# Patient Record
Sex: Female | Born: 1940 | Race: Black or African American | Hispanic: No | Marital: Married | Smoking: Former smoker
Health system: Southern US, Community
[De-identification: ages and names within clinical notes are randomized; demographics above are authoritative.]

## PROBLEM LIST (undated history)

## (undated) DIAGNOSIS — I351 Nonrheumatic aortic (valve) insufficiency: Secondary | ICD-10-CM

## (undated) DIAGNOSIS — D649 Anemia, unspecified: Secondary | ICD-10-CM

## (undated) DIAGNOSIS — N189 Chronic kidney disease, unspecified: Secondary | ICD-10-CM

## (undated) DIAGNOSIS — N393 Stress incontinence (female) (male): Secondary | ICD-10-CM

## (undated) DIAGNOSIS — M1990 Unspecified osteoarthritis, unspecified site: Secondary | ICD-10-CM

## (undated) DIAGNOSIS — Z89619 Acquired absence of unspecified leg above knee: Secondary | ICD-10-CM

## (undated) DIAGNOSIS — Z9289 Personal history of other medical treatment: Secondary | ICD-10-CM

## (undated) DIAGNOSIS — Z8701 Personal history of pneumonia (recurrent): Secondary | ICD-10-CM

## (undated) DIAGNOSIS — Z8709 Personal history of other diseases of the respiratory system: Secondary | ICD-10-CM

## (undated) DIAGNOSIS — I739 Peripheral vascular disease, unspecified: Secondary | ICD-10-CM

## (undated) DIAGNOSIS — I272 Pulmonary hypertension, unspecified: Secondary | ICD-10-CM

## (undated) DIAGNOSIS — R0602 Shortness of breath: Secondary | ICD-10-CM

## (undated) DIAGNOSIS — I10 Essential (primary) hypertension: Secondary | ICD-10-CM

## (undated) DIAGNOSIS — E119 Type 2 diabetes mellitus without complications: Secondary | ICD-10-CM

## (undated) HISTORY — DX: Essential (primary) hypertension: I10

## (undated) HISTORY — DX: Unspecified osteoarthritis, unspecified site: M19.90

## (undated) HISTORY — DX: Type 2 diabetes mellitus without complications: E11.9

## (undated) HISTORY — DX: Chronic kidney disease, unspecified: N18.9

## (undated) HISTORY — PX: ABDOMINAL HYSTERECTOMY: SHX81

## (undated) HISTORY — DX: Shortness of breath: R06.02

## (undated) HISTORY — DX: Peripheral vascular disease, unspecified: I73.9

---

## 1999-12-07 ENCOUNTER — Encounter: Payer: Self-pay | Admitting: Neurosurgery

## 1999-12-11 ENCOUNTER — Encounter: Payer: Self-pay | Admitting: Neurosurgery

## 1999-12-11 ENCOUNTER — Ambulatory Visit (HOSPITAL_COMMUNITY): Admission: RE | Admit: 1999-12-11 | Discharge: 1999-12-12 | Payer: Self-pay | Admitting: Neurosurgery

## 2000-09-17 HISTORY — PX: BACK SURGERY: SHX140

## 2015-04-20 HISTORY — PX: OTHER SURGICAL HISTORY: SHX169

## 2015-10-21 ENCOUNTER — Encounter: Payer: Self-pay | Admitting: Surgery

## 2015-10-25 ENCOUNTER — Encounter: Payer: Self-pay | Admitting: Surgery

## 2015-10-28 ENCOUNTER — Encounter: Payer: Self-pay | Admitting: Surgery

## 2015-10-28 ENCOUNTER — Ambulatory Visit (INDEPENDENT_AMBULATORY_CARE_PROVIDER_SITE_OTHER): Payer: Medicare Other | Admitting: Surgery

## 2015-10-28 ENCOUNTER — Ambulatory Visit (HOSPITAL_COMMUNITY)
Admission: RE | Admit: 2015-10-28 | Discharge: 2015-10-28 | Disposition: A | Discharge status: Home / Self Care | Payer: Medicare Other | Source: Ambulatory Visit | Attending: Surgery | Admitting: Surgery

## 2015-10-28 VITALS — BP 130/63 | HR 89 | Temp 97.3°F | Resp 16 | Ht 62.0 in | Wt 137.0 lb

## 2015-10-28 DIAGNOSIS — Z01810 Encounter for preprocedural cardiovascular examination: Secondary | ICD-10-CM

## 2015-10-28 DIAGNOSIS — I739 Peripheral vascular disease, unspecified: Secondary | ICD-10-CM | POA: Insufficient documentation

## 2015-10-28 DIAGNOSIS — I129 Hypertensive chronic kidney disease with stage 1 through stage 4 chronic kidney disease, or unspecified chronic kidney disease: Secondary | ICD-10-CM | POA: Diagnosis not present

## 2015-10-28 DIAGNOSIS — N189 Chronic kidney disease, unspecified: Secondary | ICD-10-CM | POA: Insufficient documentation

## 2015-10-28 DIAGNOSIS — E1122 Type 2 diabetes mellitus with diabetic chronic kidney disease: Secondary | ICD-10-CM | POA: Diagnosis not present

## 2015-10-28 NOTE — Progress Notes (Signed)
Patient name: Jessica Abbott MRN: [id number] DOB: 15-Jan-1941 Sex: female   Referred by: Dr. Renard Matter  Reason for referral:  Chief Complaint  Patient presents with  . New Evaluation    2nd opinion ? amputation of right leg    HISTORY OF PRESENT ILLNESS:  this is a 75 year old female who comes in today for a second opinion regarding her right leg. She has a recent history of a left femoral to dorsalis pedis bypass graft for limb salvage. Approximately 2 months ago she developed a wound on her right foot. 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 She has a chronic contracture of the right knee which she states has been there for 6 months.  She does state that she can ambulate. Her right leg has always been her good leg. Because of her vascular findings and her contracture, above-knee amputation has been recommended.    the patient suffers from diabetes which is not well controlled. She is medically managed for hypertension with an ACE inhibitor. She has chronic kidney disease.  She is not on a statin. She is a former smoker.  She is on Coumadin  Past Medical History  Diagnosis Date  . Hypertension   . Peripheral vascular disease ([place])   . Diabetes mellitus without complication ([place])   . Arthritis   . Peripheral arterial disease ([place])   . Chronic kidney disease   . SOB (shortness of breath)     Past Surgical History  Procedure Laterality Date  . Back surgery  2002  . Abdominal hysterectomy    . Femoral artery arteriogram  04/20/2015    Social History   Social History  . Marital Status: Married    Spouse Name: N/A  . Number of Children: N/A  . Years of Education: N/A   Occupational History  . Not on file.   Social History Main Topics  . Smoking status: Former Smoker    Quit date: 10/27/2013  . Smokeless tobacco: Never Used  . Alcohol Use: No  . Drug Use: No  .  Sexual Activity: Not on file   Other Topics Concern  . Not on file   Social History Narrative    Family History  Problem Relation Age of Onset  . Heart disease Father     Allergies as of 10/28/2015  . (No Known Allergies)    Current Outpatient Prescriptions on File Prior to Visit  Medication Sig Dispense Refill  . amLODipine (NORVASC) 5 MG tablet Take 5 mg by mouth.    Marland Kitchen aspirin EC 81 MG tablet Take 81 mg by mouth.    . baclofen (LIORESAL) 10 MG tablet TAKE ONE-HALF TABLET BY MOUTH IN THE MORNING AND ONE-HALF TABLET WITH LUNCH AND ONE TABLET AT BEDTIME AS NEEDED FOR SEVERE PAIN    . Cholecalciferol (VITAMIN D3) 2000 units capsule Take by mouth.    Marland Kitchen ipratropium-albuterol (DUONEB) 0.5-2.5 (3) MG/3ML SOLN 3 mLs.    Marland Kitchen lisinopril (PRINIVIL,ZESTRIL) 20 MG tablet Take 20 mg by mouth.    . metFORMIN (GLUCOPHAGE) 500 MG tablet TAKE ONE TABLET BY MOUTH IN THE MORNING    . polyethylene glycol (MIRALAX / GLYCOLAX) packet Take 17 g by mouth.    . warfarin (COUMADIN) 4 MG tablet Take 4 mg by mouth.    Marland Kitchen HYDROcodone-acetaminophen (NORCO/VICODIN) 5-325 MG tablet Take by mouth. Reported on 10/28/2015     No current facility-administered medications on  file prior to visit.     REVIEW OF SYSTEMS: Cardiovascular: No chest pain, chest pressure.   Positive for leg swelling and pain Pulmonary: No productive cough, asthma or wheezing. Neurologic: No weakness, paresthesias, aphasia, or amaurosis. No dizziness. Hematologic: No bleeding problems or clotting disorders. Musculoskeletal: No joint pain or joint swelling. Gastrointestinal: No blood in stool or hematemesis Genitourinary: No dysuria or hematuria. Psychiatric:: No history of major depression. Integumentary:  Open wounds on right toes Constitutional: No fever or chills.  PHYSICAL EXAMINATION:  Filed Vitals:   10/28/15 1046  BP: 130/63  Pulse: 89  Temp: 97.3 F (36.3 C)  Resp: 16  Height: 5\' 2"  (1.575 m)  Weight: 137 lb (62.143  kg)  SpO2: 100%   Body mass index is 25.05 kg/(m^2). General: The patient appears their stated age.   HEENT:  No gross abnormalities Pulmonary: Respirations are non-labored Musculoskeletal:  Contracture to the right knee Neurologic: No focal weakness or paresthesias are detected, Skin:  Ischemic changes to the right first second third and fourth toe Psychiatric: The patient has normal affect. Cardiovascular: There is a regular rate and rhythm without significant murmur appreciated. Pedal pulses are not palpable   Diagnostic Studies:  vein mapping reveals an adequate right saphenous vein   I have reviewed her outside angiography which shows small vessels but a patent iliofemoral system.  The superficial femoral artery is occluded.  There is single vessel runoff via the peroneal artery   Assessment:   atherosclerosis with ulceration, right leg Plan:  this is a very difficult situation for this patient because she had a Storq leg has deep ended on her right leg, and now she has limb threatening issues with this leg. Most concerning is the chronic contracture in her right leg. I think this will make bypass surgery difficult.  At this point, this leg is not very functional and so I'm concerned about putting her through a bypass with limited benefit.  Regardless she has ischemic changes to her toes which at minimum will require a transmetatarsal amputation.  I do not think she will heal this without revascularization.  Therefore the question is to proceed with an above-knee amputation as primary therapy or to put her through a thin peroneal bypass graft and transmetatarsal amputation.   after discussing this further with the family, they want to think about this over the weekend but are leaning towards a right femoral peroneal bypass grafting and transmetatarsal amputation. They understand that if this does not work or she has issues that she will need an above-knee amputation. She will need to be  off Coumadin for 5 days.  She tells me she is on Coumadin because of her fem dorsalis pedis bypass   V. Leia Alf, M.D. [facility] Office: [phone] Pager:  [phone]

## 2015-11-01 NOTE — Addendum Note (Signed)
Addended by: Dorthula Rue L on: 11/01/2015 09:58 AM   Modules accepted: Orders

## 2015-11-15 ENCOUNTER — Inpatient Hospital Stay (HOSPITAL_COMMUNITY)
Admission: RE | Admit: 2015-11-15 | Discharge: 2015-11-15 | Disposition: A | Discharge status: Home / Self Care | Payer: Medicare Other | Source: Ambulatory Visit

## 2015-11-15 DIAGNOSIS — Z01810 Encounter for preprocedural cardiovascular examination: Secondary | ICD-10-CM

## 2015-11-15 DIAGNOSIS — I739 Peripheral vascular disease, unspecified: Secondary | ICD-10-CM

## 2015-11-16 ENCOUNTER — Other Ambulatory Visit: Payer: Self-pay

## 2015-11-18 ENCOUNTER — Telehealth: Payer: Self-pay

## 2015-11-18 NOTE — Telephone Encounter (Signed)
Rec'd phone call from Nurse Practitioner re: question of Lovenox bridge while off Coumadin.  Stated she is requesting the Vascular Surgeon to make the decision re: necessity of Lovenox Bridge, while off Coumadin, preoperatively.  Reported history that the pt. was initially started on Coumadin by a Vascular surgeon @ [facility], following a Femoral - Pedal Dorsalis BP.   Discussed Lovenox bridge with Dr. Trula Slade.  Advised that pt. should stop Coumadin 5 days prior to surgery, and will not need a Lovenox bridge.  Reported Dr. Stephens Shire recommendation to [facility] at [facility]. Clinic.  Will fax recommendations to [phone].

## 2015-11-22 ENCOUNTER — Inpatient Hospital Stay (HOSPITAL_BASED_OUTPATIENT_CLINIC_OR_DEPARTMENT_OTHER)
Admission: EM | Admit: 2015-11-22 | Discharge: 2015-11-29 | DRG: 377 | Disposition: A | Discharge status: Home / Self Care | Payer: Medicare Other | Attending: Internal Medicine | Admitting: Internal Medicine

## 2015-11-22 ENCOUNTER — Encounter (HOSPITAL_BASED_OUTPATIENT_CLINIC_OR_DEPARTMENT_OTHER): Payer: Self-pay | Admitting: *Deleted

## 2015-11-22 ENCOUNTER — Emergency Department (HOSPITAL_BASED_OUTPATIENT_CLINIC_OR_DEPARTMENT_OTHER): Payer: Medicare Other

## 2015-11-22 DIAGNOSIS — E1169 Type 2 diabetes mellitus with other specified complication: Secondary | ICD-10-CM

## 2015-11-22 DIAGNOSIS — I70234 Atherosclerosis of native arteries of right leg with ulceration of heel and midfoot: Secondary | ICD-10-CM | POA: Diagnosis not present

## 2015-11-22 DIAGNOSIS — Z7984 Long term (current) use of oral hypoglycemic drugs: Secondary | ICD-10-CM

## 2015-11-22 DIAGNOSIS — A480 Gas gangrene: Secondary | ICD-10-CM | POA: Diagnosis present

## 2015-11-22 DIAGNOSIS — I739 Peripheral vascular disease, unspecified: Secondary | ICD-10-CM | POA: Diagnosis not present

## 2015-11-22 DIAGNOSIS — I96 Gangrene, not elsewhere classified: Secondary | ICD-10-CM

## 2015-11-22 DIAGNOSIS — K449 Diaphragmatic hernia without obstruction or gangrene: Secondary | ICD-10-CM | POA: Diagnosis present

## 2015-11-22 DIAGNOSIS — K921 Melena: Principal | ICD-10-CM

## 2015-11-22 DIAGNOSIS — Z79899 Other long term (current) drug therapy: Secondary | ICD-10-CM

## 2015-11-22 DIAGNOSIS — M1990 Unspecified osteoarthritis, unspecified site: Secondary | ICD-10-CM | POA: Diagnosis present

## 2015-11-22 DIAGNOSIS — Z7901 Long term (current) use of anticoagulants: Secondary | ICD-10-CM

## 2015-11-22 DIAGNOSIS — N179 Acute kidney failure, unspecified: Secondary | ICD-10-CM | POA: Diagnosis present

## 2015-11-22 DIAGNOSIS — Z87891 Personal history of nicotine dependence: Secondary | ICD-10-CM

## 2015-11-22 DIAGNOSIS — R791 Abnormal coagulation profile: Secondary | ICD-10-CM | POA: Diagnosis present

## 2015-11-22 DIAGNOSIS — K5730 Diverticulosis of large intestine without perforation or abscess without bleeding: Secondary | ICD-10-CM | POA: Diagnosis present

## 2015-11-22 DIAGNOSIS — R578 Other shock: Secondary | ICD-10-CM

## 2015-11-22 DIAGNOSIS — D689 Coagulation defect, unspecified: Secondary | ICD-10-CM

## 2015-11-22 DIAGNOSIS — I349 Nonrheumatic mitral valve disorder, unspecified: Secondary | ICD-10-CM | POA: Diagnosis not present

## 2015-11-22 DIAGNOSIS — E1152 Type 2 diabetes mellitus with diabetic peripheral angiopathy with gangrene: Secondary | ICD-10-CM | POA: Diagnosis present

## 2015-11-22 DIAGNOSIS — E1122 Type 2 diabetes mellitus with diabetic chronic kidney disease: Secondary | ICD-10-CM | POA: Diagnosis present

## 2015-11-22 DIAGNOSIS — M24561 Contracture, right knee: Secondary | ICD-10-CM | POA: Diagnosis present

## 2015-11-22 DIAGNOSIS — E11622 Type 2 diabetes mellitus with other skin ulcer: Secondary | ICD-10-CM | POA: Diagnosis present

## 2015-11-22 DIAGNOSIS — D62 Acute posthemorrhagic anemia: Secondary | ICD-10-CM

## 2015-11-22 DIAGNOSIS — E118 Type 2 diabetes mellitus with unspecified complications: Secondary | ICD-10-CM | POA: Diagnosis present

## 2015-11-22 DIAGNOSIS — K922 Gastrointestinal hemorrhage, unspecified: Secondary | ICD-10-CM

## 2015-11-22 DIAGNOSIS — N189 Chronic kidney disease, unspecified: Secondary | ICD-10-CM | POA: Diagnosis present

## 2015-11-22 DIAGNOSIS — D500 Iron deficiency anemia secondary to blood loss (chronic): Secondary | ICD-10-CM | POA: Diagnosis present

## 2015-11-22 DIAGNOSIS — Z7982 Long term (current) use of aspirin: Secondary | ICD-10-CM

## 2015-11-22 DIAGNOSIS — I959 Hypotension, unspecified: Secondary | ICD-10-CM | POA: Diagnosis not present

## 2015-11-22 DIAGNOSIS — Z993 Dependence on wheelchair: Secondary | ICD-10-CM | POA: Diagnosis not present

## 2015-11-22 DIAGNOSIS — E872 Acidosis, unspecified: Secondary | ICD-10-CM

## 2015-11-22 DIAGNOSIS — L98499 Non-pressure chronic ulcer of skin of other sites with unspecified severity: Secondary | ICD-10-CM | POA: Diagnosis present

## 2015-11-22 DIAGNOSIS — I129 Hypertensive chronic kidney disease with stage 1 through stage 4 chronic kidney disease, or unspecified chronic kidney disease: Secondary | ICD-10-CM | POA: Diagnosis present

## 2015-11-22 DIAGNOSIS — T45515A Adverse effect of anticoagulants, initial encounter: Secondary | ICD-10-CM | POA: Diagnosis present

## 2015-11-22 DIAGNOSIS — S36209S Unspecified injury of unspecified part of pancreas, sequela: Secondary | ICD-10-CM | POA: Diagnosis present

## 2015-11-22 DIAGNOSIS — S36209A Unspecified injury of unspecified part of pancreas, initial encounter: Secondary | ICD-10-CM

## 2015-11-22 DIAGNOSIS — I272 Other secondary pulmonary hypertension: Secondary | ICD-10-CM | POA: Diagnosis present

## 2015-11-22 DIAGNOSIS — E139 Other specified diabetes mellitus without complications: Secondary | ICD-10-CM

## 2015-11-22 LAB — OCCULT BLOOD X 1 CARD TO LAB, STOOL: FECAL OCCULT BLD: POSITIVE — AB

## 2015-11-22 LAB — CBC WITH DIFFERENTIAL/PLATELET
BASOS PCT: 0 %
Basophils Absolute: 0 10*3/uL (ref 0.0–0.1)
EOS PCT: 0 %
Eosinophils Absolute: 0 10*3/uL (ref 0.0–0.7)
HCT: 12.5 % — ABNORMAL LOW (ref 36.0–46.0)
Hemoglobin: 3.5 g/dL — CL (ref 12.0–15.0)
LYMPHS ABS: 2.7 10*3/uL (ref 0.7–4.0)
Lymphocytes Relative: 22 %
MCH: 18.9 pg — AB (ref 26.0–34.0)
MCHC: 28 g/dL — ABNORMAL LOW (ref 30.0–36.0)
MCV: 67.6 fL — AB (ref 78.0–100.0)
MONO ABS: 0.4 10*3/uL (ref 0.1–1.0)
MYELOCYTES: 1 %
Monocytes Relative: 3 %
NEUTROS ABS: 9.1 10*3/uL — AB (ref 1.7–7.7)
NEUTROS PCT: 74 %
PLATELETS: 610 10*3/uL — AB (ref 150–400)
RBC: 1.85 MIL/uL — ABNORMAL LOW (ref 3.87–5.11)
RDW: 18.6 % — AB (ref 11.5–15.5)
WBC: 12.2 10*3/uL — ABNORMAL HIGH (ref 4.0–10.5)

## 2015-11-22 LAB — PREPARE RBC (CROSSMATCH)

## 2015-11-22 LAB — SEDIMENTATION RATE: SED RATE: 120 mm/h — AB (ref 0–22)

## 2015-11-22 LAB — CBC
HCT: 11.9 % — ABNORMAL LOW (ref 36.0–46.0)
Hemoglobin: 3.3 g/dL — CL (ref 12.0–15.0)
MCH: 18.6 pg — ABNORMAL LOW (ref 26.0–34.0)
MCHC: 27.7 g/dL — ABNORMAL LOW (ref 30.0–36.0)
MCV: 67.2 fL — ABNORMAL LOW (ref 78.0–100.0)
PLATELETS: 614 10*3/uL — AB (ref 150–400)
RBC: 1.77 MIL/uL — ABNORMAL LOW (ref 3.87–5.11)
RDW: 18.3 % — AB (ref 11.5–15.5)
WBC: 12.1 10*3/uL — AB (ref 4.0–10.5)

## 2015-11-22 LAB — GLUCOSE, CAPILLARY
Glucose-Capillary: 100 mg/dL — ABNORMAL HIGH (ref 65–99)
Glucose-Capillary: 106 mg/dL — ABNORMAL HIGH (ref 65–99)

## 2015-11-22 LAB — I-STAT CG4 LACTIC ACID, ED: LACTIC ACID, VENOUS: 4.06 mmol/L — AB (ref 0.5–2.0)

## 2015-11-22 LAB — COMPREHENSIVE METABOLIC PANEL
ALK PHOS: 47 U/L (ref 38–126)
ALT: 11 U/L — AB (ref 14–54)
AST: 24 U/L (ref 15–41)
Albumin: 2.7 g/dL — ABNORMAL LOW (ref 3.5–5.0)
Anion gap: 11 (ref 5–15)
BUN: 23 mg/dL — AB (ref 6–20)
CALCIUM: 8.2 mg/dL — AB (ref 8.9–10.3)
CO2: 18 mmol/L — AB (ref 22–32)
CREATININE: 0.89 mg/dL (ref 0.44–1.00)
Chloride: 107 mmol/L (ref 101–111)
GFR calc non Af Amer: >60 mL/min (ref >60)
GLUCOSE: 177 mg/dL — AB (ref 65–99)
Potassium: 4.2 mmol/L (ref 3.5–5.1)
SODIUM: 136 mmol/L (ref 135–145)
Total Bilirubin: 0.5 mg/dL (ref 0.3–1.2)
Total Protein: 7.1 g/dL (ref 6.5–8.1)

## 2015-11-22 LAB — MRSA PCR SCREENING: MRSA by PCR: NEGATIVE

## 2015-11-22 LAB — PROTIME-INR
INR: 2.24 — AB (ref 0.00–1.49)
Prothrombin Time: 24.6 seconds — ABNORMAL HIGH (ref 11.6–15.2)

## 2015-11-22 LAB — ABO/RH: ABO/RH(D): A POS

## 2015-11-22 MED ORDER — SODIUM CHLORIDE 0.9 % IV SOLN
Freq: Once | INTRAVENOUS | Status: AC
  Administered 2015-11-22: 14:00:00 via INTRAVENOUS

## 2015-11-22 MED ORDER — FENTANYL CITRATE (PF) 100 MCG/2ML IJ SOLN
50.0000 ug | Freq: Once | INTRAMUSCULAR | Status: AC
  Administered 2015-11-22: 50 ug via INTRAVENOUS

## 2015-11-22 MED ORDER — SODIUM CHLORIDE 0.9 % IV SOLN
Freq: Once | INTRAVENOUS | Status: AC
  Administered 2015-11-22: 10 mL/h via INTRAVENOUS

## 2015-11-22 MED ORDER — PANTOPRAZOLE SODIUM 40 MG IV SOLR
40.0000 mg | Freq: Two times a day (BID) | INTRAVENOUS | Status: DC
  Administered 2015-11-26 – 2015-11-28 (×4): 40 mg via INTRAVENOUS
  Filled 2015-11-22 (×5): qty 40

## 2015-11-22 MED ORDER — SODIUM CHLORIDE 0.9 % IV SOLN: 250.0000 mL | INTRAVENOUS | Status: DC | PRN

## 2015-11-22 MED ORDER — SODIUM CHLORIDE 0.9 % IV SOLN: 10.0000 mL/h | Freq: Once | INTRAVENOUS | Status: AC

## 2015-11-22 MED ORDER — VITAMIN K1 10 MG/ML IJ SOLN
10.0000 mg | Freq: Once | INTRAMUSCULAR | Status: AC
  Administered 2015-11-22: 10 mg via INTRAMUSCULAR
  Filled 2015-11-22: qty 1

## 2015-11-22 MED ORDER — SODIUM CHLORIDE 0.9 % IV SOLN
1500.0000 mg | Freq: Once | INTRAVENOUS | Status: AC
  Administered 2015-11-22: 1500 mg via INTRAVENOUS
  Filled 2015-11-22: qty 1500

## 2015-11-22 MED ORDER — SODIUM CHLORIDE 0.9% FLUSH
3.0000 mL | Freq: Two times a day (BID) | INTRAVENOUS | Status: DC
  Administered 2015-11-22 – 2015-11-23 (×3): 3 mL via INTRAVENOUS
  Administered 2015-11-24: 10 mL via INTRAVENOUS
  Administered 2015-11-25 – 2015-11-28 (×5): 3 mL via INTRAVENOUS

## 2015-11-22 MED ORDER — MORPHINE SULFATE (PF) 2 MG/ML IV SOLN
2.0000 mg | INTRAVENOUS | Status: DC | PRN
  Administered 2015-11-22: 4 mg via INTRAVENOUS
  Administered 2015-11-23 – 2015-11-25 (×5): 2 mg via INTRAVENOUS
  Administered 2015-11-25: 4 mg via INTRAVENOUS
  Administered 2015-11-25 – 2015-11-29 (×5): 2 mg via INTRAVENOUS
  Filled 2015-11-22 (×3): qty 1
  Filled 2015-11-22: qty 2
  Filled 2015-11-22 (×4): qty 1
  Filled 2015-11-22: qty 2
  Filled 2015-11-22 (×3): qty 1

## 2015-11-22 MED ORDER — BACLOFEN 10 MG PO TABS
5.0000 mg | ORAL_TABLET | Freq: Two times a day (BID) | ORAL | Status: DC
  Administered 2015-11-22 – 2015-11-29 (×12): 5 mg via ORAL
  Filled 2015-11-22 (×15): qty 1

## 2015-11-22 MED ORDER — SODIUM CHLORIDE 0.9 % IV SOLN: 80.0000 mg | Freq: Once | INTRAVENOUS | Status: DC

## 2015-11-22 MED ORDER — INSULIN ASPART 100 UNIT/ML ~~LOC~~ SOLN
0.0000 [IU] | SUBCUTANEOUS | Status: DC
Start: 2015-11-22 — End: 2015-11-24
  Administered 2015-11-23: 2 [IU] via SUBCUTANEOUS
  Administered 2015-11-24 (×4): 1 [IU] via SUBCUTANEOUS

## 2015-11-22 MED ORDER — SODIUM CHLORIDE 0.9 % IV SOLN
80.0000 mg | Freq: Once | INTRAVENOUS | Status: AC
  Filled 2015-11-22: qty 80

## 2015-11-22 MED ORDER — VANCOMYCIN HCL IN DEXTROSE 1-5 GM/200ML-% IV SOLN: 1000.0000 mg | INTRAVENOUS | Status: DC

## 2015-11-22 MED ORDER — TRAZODONE HCL 100 MG PO TABS
100.0000 mg | ORAL_TABLET | Freq: Every evening | ORAL | Status: DC | PRN
  Administered 2015-11-26 – 2015-11-28 (×3): 100 mg via ORAL
  Filled 2015-11-22 (×4): qty 1

## 2015-11-22 MED ORDER — SODIUM CHLORIDE 0.9% FLUSH
3.0000 mL | INTRAVENOUS | Status: DC | PRN
  Administered 2015-11-22 (×2): 3 mL via INTRAVENOUS
  Filled 2015-11-22 (×2): qty 3

## 2015-11-22 MED ORDER — VANCOMYCIN HCL 10 G IV SOLR
1500.0000 mg | Freq: Once | INTRAVENOUS | Status: DC
  Filled 2015-11-22: qty 1500

## 2015-11-22 MED ORDER — PANTOPRAZOLE SODIUM 40 MG IV SOLR
INTRAVENOUS | Status: AC
  Filled 2015-11-22: qty 80

## 2015-11-22 MED ORDER — SODIUM CHLORIDE 0.9 % IV SOLN
INTRAVENOUS | Status: DC
  Administered 2015-11-22: 50 mL/h via INTRAVENOUS
  Administered 2015-11-25 – 2015-11-27 (×3): via INTRAVENOUS

## 2015-11-22 MED ORDER — PIPERACILLIN-TAZOBACTAM 3.375 G IVPB
3.3750 g | Freq: Three times a day (TID) | INTRAVENOUS | Status: DC
  Filled 2015-11-22 (×3): qty 50

## 2015-11-22 MED ORDER — PIPERACILLIN-TAZOBACTAM 3.375 G IVPB
INTRAVENOUS | Status: AC
  Administered 2015-11-22: 3.375 g via INTRAVENOUS
  Filled 2015-11-22: qty 50

## 2015-11-22 MED ORDER — FENTANYL CITRATE (PF) 100 MCG/2ML IJ SOLN
50.0000 ug | Freq: Once | INTRAMUSCULAR | Status: AC
  Administered 2015-11-22: 50 ug via INTRAVENOUS
  Filled 2015-11-22: qty 2

## 2015-11-22 MED ORDER — SODIUM CHLORIDE 0.9 % IV SOLN: INTRAVENOUS | Status: DC

## 2015-11-22 MED ORDER — SODIUM CHLORIDE 0.9 % IV BOLUS (SEPSIS)
1000.0000 mL | Freq: Once | INTRAVENOUS | Status: AC
  Administered 2015-11-22: 1000 mL via INTRAVENOUS

## 2015-11-22 MED ORDER — MORPHINE SULFATE (PF) 4 MG/ML IV SOLN
INTRAVENOUS | Status: AC
  Administered 2015-11-22: 4 mg
  Filled 2015-11-22: qty 1

## 2015-11-22 MED ORDER — PIPERACILLIN-TAZOBACTAM 3.375 G IVPB 30 MIN
3.3750 g | Freq: Once | INTRAVENOUS | Status: DC
  Administered 2015-11-22: 3.375 g via INTRAVENOUS
  Filled 2015-11-22: qty 50

## 2015-11-22 MED ORDER — SODIUM CHLORIDE 0.9 % IV BOLUS (SEPSIS): 500.0000 mL | INTRAVENOUS | Status: AC

## 2015-11-22 MED ORDER — SODIUM CHLORIDE 0.9 % IV SOLN
8.0000 mg/h | INTRAVENOUS | Status: AC
  Administered 2015-11-22 – 2015-11-25 (×5): 8 mg/h via INTRAVENOUS
  Filled 2015-11-22 (×13): qty 80

## 2015-11-22 MED ORDER — PANTOPRAZOLE SODIUM 40 MG IV SOLR
40.0000 mg | Freq: Once | INTRAVENOUS | Status: AC
  Administered 2015-11-22: 40 mg via INTRAVENOUS

## 2015-11-22 MED ORDER — SODIUM CHLORIDE 0.9 % IV SOLN: INTRAVENOUS | Status: AC

## 2015-11-22 MED ORDER — FENTANYL CITRATE (PF) 100 MCG/2ML IJ SOLN
INTRAMUSCULAR | Status: AC
  Filled 2015-11-22: qty 2

## 2015-11-22 NOTE — ED Notes (Signed)
Dr. Kathrynn Humble notified of critical hgb.

## 2015-11-22 NOTE — H&P (Signed)
PULMONARY / CRITICAL CARE MEDICINE   Name: Jessica Abbott MRN: [id number] DOB: Nov 21, 1940    ADMISSION DATE:  11/22/2015 CONSULTATION DATE: 11/22/15  REFERRING MD:  ED  CHIEF COMPLAINT: C/O increased weakness  HISTORY OF PRESENT ILLNESS:   Jessica Abbott is a 75 year old female with PMH significant for Hypertension, DM without complication, chronic kidney disease, Peripheral vascular disease, arthritis,SOB. She is chronically in wheelchair and has not walked for several months. She is unable to straighten out her R leg. Injured her foot in December and has been followed by vascular surgery and was scheduled to go to OR 3/14 for right femoral peroneal bypass grafting and transmetatarsal amputation. She has also been on coumadin due to this severe vascular disease and started having black stools about 2 weeks ago, she reported this at coumadin clinic and apparently the way she described her stool was not alarming.  Over the past few days her weakness has significantly progressed, which caused her to present to ED at [facility]. In the ED on examination by the nurse it was noted that her right toes was black in color with foul odor and were unable to doppler her pedal or Tibial pulse. Upon arrival to ED her labs shows acute anaemia with Hb-3.3, WBC-12.1,platelets-614. X-Ray of foot also shows soft tissue gas and code sepsis was activated. Patient was given 1.5 liters fluid in the ER and 500 cc of blood.She was also started on Van/Zosyn. Patient is O neg and the blood bank only has 2 units of blood in the reserve and therefore patient is transferred on 3/7 to [facility].  PAST MEDICAL HISTORY :  She  has a past medical history of Hypertension; Peripheral vascular disease ([place]); Diabetes mellitus without complication ([place]); Arthritis; Peripheral arterial disease ([place]); Chronic kidney disease; and SOB (shortness of breath).  PAST SURGICAL HISTORY: She  has past surgical history that includes Back surgery  (2002); Abdominal hysterectomy; and femoral artery arteriogram (04/20/2015).  No Known Allergies  No current facility-administered medications on file prior to encounter.   Current Outpatient Prescriptions on File Prior to Encounter  Medication Sig  . amLODipine (NORVASC) 5 MG tablet Take 5 mg by mouth.  Marland Kitchen aspirin EC 81 MG tablet Take 81 mg by mouth.  . baclofen (LIORESAL) 10 MG tablet TAKE ONE-HALF TABLET BY MOUTH IN THE MORNING AND ONE-HALF TABLET WITH LUNCH AND ONE TABLET AT BEDTIME AS NEEDED FOR SEVERE PAIN  . Cholecalciferol (VITAMIN D3) 2000 units capsule Take by mouth.  Marland Kitchen HYDROcodone-acetaminophen (NORCO/VICODIN) 5-325 MG tablet Take by mouth. Reported on 10/28/2015  . ipratropium-albuterol (DUONEB) 0.5-2.5 (3) MG/3ML SOLN 3 mLs.  Marland Kitchen lisinopril (PRINIVIL,ZESTRIL) 20 MG tablet Take 20 mg by mouth.  . metFORMIN (GLUCOPHAGE) 500 MG tablet TAKE ONE TABLET BY MOUTH IN THE MORNING  . polyethylene glycol (MIRALAX / GLYCOLAX) packet Take 17 g by mouth.  . warfarin (COUMADIN) 4 MG tablet Take 4 mg by mouth.    FAMILY HISTORY:  Her indicated that her mother is deceased. She indicated that her father is deceased.   SOCIAL HISTORY: She  reports that she quit smoking about 2 years ago. She has never used smokeless tobacco. She reports that she does not drink alcohol or use illicit drugs.  REVIEW OF SYSTEMS:   Bolds are positive  Constitutional: weight loss, gain, night sweats, Fevers, chills, fatigue .  HEENT: headaches, Sore throat, sneezing, nasal congestion, post nasal drip, Difficulty swallowing, Tooth/dental problems, visual complaints visual changes, ear ache CV:  chest  pain, radiates: ,Orthopnea, PND, swelling in lower extremities, dizziness, palpitations, syncope.  GI  heartburn, indigestion, abdominal pain, nausea, vomiting, diarrhea, change in bowel habits, loss of appetite, melena  Resp: cough, productive: , hemoptysis, dyspnea, chest pain, pleuritic.  Skin: rash or itching or  icterus GU: dysuria, change in color of urine, urgency or frequency. flank pain, hematuria  MS: joint pain or swelling. decreased range of motion  Psych: change in mood or affect. depression or anxiety.  Neuro: difficulty with speech, weakness, numbness, ataxia    SUBJECTIVE:     VITAL SIGNS: BP 116/45 mmHg  Pulse 94  Temp(Src) 98.9 F (37.2 C) (Oral)  Resp 20  SpO2 100%  HEMODYNAMICS:    VENTILATOR SETTINGS:    INTAKE / OUTPUT:    PHYSICAL EXAMINATION: General:  Chronically ill appearing female in NAD Neuro:  Alert, oriented, non-focal HEENT:  [place]/AT PERRL no JVD Cardiovascular:  RRR no MRG Lungs:  Clear bilateral breath sounds Abdomen:  Soft, non-tender, non-distended Musculoskeletal:  Chronic contracture of RLE Skin:  Ischemic changes to R foot.  LABS:  BMET  Recent Labs Lab 11/22/15 1355  NA 136  K 4.2  CL 107  CO2 18*  BUN 23*  CREATININE 0.89  GLUCOSE 177*    Electrolytes  Recent Labs Lab 11/22/15 1355  CALCIUM 8.2*    CBC  Recent Labs Lab 11/22/15 1355  WBC 12.2*  HGB 3.5*  HCT 12.5*  PLT 610*    Coag's  Recent Labs Lab 11/22/15 1355  INR 2.24*    Sepsis Markers  Recent Labs Lab 11/22/15 1423  LATICACIDVEN 4.06*    ABG No results for input(s): PHART, PCO2ART, PO2ART in the last 168 hours.  Liver Enzymes  Recent Labs Lab 11/22/15 1355  AST 24  ALT 11*  ALKPHOS 47  BILITOT 0.5  ALBUMIN 2.7*    Cardiac Enzymes No results for input(s): TROPONINI, PROBNP in the last 168 hours.  Glucose No results for input(s): GLUCAP in the last 168 hours.  Imaging Dg Foot Complete Right  11/22/2015  CLINICAL DATA:  Right foot pain. Injury in December. Initial encounter. EXAM: RIGHT FOOT COMPLETE - 3+ VIEW COMPARISON:  None. FINDINGS: There is no evidence of fracture or dislocation. No evidence osteolysis or periostitis. Generalized osteopenia noted. Soft tissue swelling and soft tissue gas involving the distal great toe.  IMPRESSION: Soft tissue swelling and soft tissue gas involving the distal great toe. Gas-forming infection cannot be excluded. No acute osseous abnormality identified. Electronically Signed   By: Earle Gell M.D.   On: 11/22/2015 14:57     STUDIES:    CULTURES: BCx2 3/7 >   ANTIBIOTICS:  3/7 Vancomycin  3/7 Zosyn  SIGNIFICANT EVENTS: 3/7 admit  LINES/TUBES:  DISCUSSION: 75 year old female with multiple medical problems including severe PAD with RLE ischemia followed by Dr. Trula Slade. Scheduled for surgery 3/14. On coumadin reportedly for PAD. Melena x 2 weeks with increasing weakness. Presented ED 3/7 with this complaint. Hemoglobin 3.3. INR 2.24. Will transfuse PRBC and FFP. Protonix infusion. GI likely scope 3/8.  ASSESSMENT / PLAN:  PULMONARY A: No acute issues  P:   Supplemental O2 as needed to keep SpO2 > 95%  CARDIOVASCULAR A:  Severe PVD on Coumadin Elevated lactic likely secondary to ischemic R foot. Hx of Hypertension  P:  Telemetry monitoring Holding home Norvasc, asa, lisinopril for now S/p 1 u PRBC, will order 2 additional Consult vascular in AM  RENAL A:   CKD  P:  Trend BUN/Cr  GASTROINTESTINAL A:   Likely upper GI bleeding  P:   NPO Protonix infusion GI following, likely EDG 3/8  HEMATOLOGIC A:   Acute blood loss anemia Coagulopathy secondary to warfarin Melena  P:  Transfuse 2 units FFP Vit K given in ED Trend CBC/Coags tonight and in AM Monitor fever curve  INFECTIOUS A:   R foot ischemia with gas on CXR Peripheral Vascular Disease  P:   Hold ABX for now Trend Procalcitonin Follow WBC and fever curve  ENDOCRINE A:   DM  P:   SSI coverage  NEUROLOGIC A:   Pain management  P:   PRN morphine Continue home baclofen Monitor  FAMILY  - Updates: Patient updated by BQ at bedside 3/7  - Inter-disciplinary family meet or Palliative Care meeting due by:  3/15   Georgann Housekeeper, AGACNP-BC [place]  Pulmonology/Critical Care Pager [phone] or [phone]  11/22/2015 5:24 PM

## 2015-11-22 NOTE — ED Notes (Signed)
BC x 1 set obtained

## 2015-11-22 NOTE — ED Notes (Signed)
MD at bedside. 

## 2015-11-22 NOTE — ED Notes (Signed)
Pt reports injury to her right toes in December, has been followed by vascular surgery since then. Daughter states she is scheduled for surgery next Tuesday. Today c/o weakness. Pt had inr check yesterday and "it showed her blood was way too thin" per daughter. Pt daughter states she is having her usual amount of pain today, but increased weakness which is not normal for her.

## 2015-11-22 NOTE — Consult Note (Signed)
Referring Provider:  Dr. Lake Bells Primary Care Physician:  Nicola Girt, DO Primary Gastroenterologist:  Althia Forts  Reason for Consultation:  GI bleeding with profound anemia  HPI: Jessica Abbott is a 75 y.o. female on a daily 81 mg aspirin, plus Coumadin (no PPI prophylaxis), because of history of peripheral vascular disease.  She presented to the [facility] today because of intensified ischemic foot pain, and a several day history of weakness, without chest pain or significant dyspnea. There, she was found to have melenic, heme positive stool, and a hemoglobin of 3.3, so she was administered O- blood and transferred to the [facility] medical intensive care unit for management.   Note that her INR was therapeutic at 2.2, platelets were somewhat elevated at 610,000, and BUN was only slightly elevated at 23.  The patient has noticed dark stools on and off, which she related to dietary factors. She notes only one bowel movement per day, each morning. She has not noticed any frank rectal bleeding. She does not have any prodromal dyspeptic symptomatology. There is no prior history of GI bleeding.   Past Medical History  Diagnosis Date  . Hypertension   . Peripheral vascular disease ([place])   . Diabetes mellitus without complication ([place])   . Arthritis   . Peripheral arterial disease ([place])   . Chronic kidney disease   . SOB (shortness of breath)     Past Surgical History  Procedure Laterality Date  . Back surgery  2002  . Abdominal hysterectomy    . Femoral artery arteriogram  04/20/2015    Prior to Admission medications   Medication Sig Start Date End Date Taking? Authorizing Provider  amLODipine (NORVASC) 5 MG tablet Take 5 mg by mouth. 09/27/15   Historical Provider, MD  aspirin EC 81 MG tablet Take 81 mg by mouth.    Historical Provider, MD  baclofen (LIORESAL) 10 MG tablet TAKE ONE-HALF TABLET BY MOUTH IN THE MORNING AND ONE-HALF TABLET WITH LUNCH AND ONE TABLET AT  BEDTIME AS NEEDED FOR SEVERE PAIN 10/06/15   Historical Provider, MD  Cholecalciferol (VITAMIN D3) 2000 units capsule Take by mouth.    Historical Provider, MD  HYDROcodone-acetaminophen (NORCO/VICODIN) 5-325 MG tablet Take by mouth. Reported on 10/28/2015 09/08/15   Historical Provider, MD  ipratropium-albuterol (DUONEB) 0.5-2.5 (3) MG/3ML SOLN 3 mLs.    Historical Provider, MD  lisinopril (PRINIVIL,ZESTRIL) 20 MG tablet Take 20 mg by mouth.    Historical Provider, MD  metFORMIN (GLUCOPHAGE) 500 MG tablet TAKE ONE TABLET BY MOUTH IN THE MORNING 09/22/15   Historical Provider, MD  polyethylene glycol (MIRALAX / GLYCOLAX) packet Take 17 g by mouth.    Historical Provider, MD  warfarin (COUMADIN) 4 MG tablet Take 4 mg by mouth.    Historical Provider, MD    Current Facility-Administered Medications  Medication Dose Route Frequency Provider Last Rate Last Dose  . 0.9 %  sodium chloride infusion  10 mL/hr Intravenous Once Ankit Nanavati, MD      . 0.9 %  sodium chloride infusion  250 mL Intravenous PRN Juanito Doom, MD      . 0.9 %  sodium chloride infusion   Intravenous Continuous Juanito Doom, MD      . 0.9 %  sodium chloride infusion  250 mL Intravenous PRN Juanito Doom, MD      . 0.9 %  sodium chloride infusion   Intravenous Once Juanito Doom, MD      . baclofen (  LIORESAL) tablet 5 mg  5 mg Oral BID Juanito Doom, MD      . fentaNYL (SUBLIMAZE) 100 MCG/2ML injection           . insulin aspart (novoLOG) injection 0-9 Units  0-9 Units Subcutaneous 6 times per day Juanito Doom, MD      . morphine 2 MG/ML injection 2-4 mg  2-4 mg Intravenous Q3H PRN Juanito Doom, MD      . pantoprazole (PROTONIX) 80 mg in sodium chloride 0.9 % 100 mL IVPB  80 mg Intravenous Once Juanito Doom, MD      . pantoprazole (PROTONIX) 80 mg in sodium chloride 0.9 % 250 mL (0.32 mg/mL) infusion  8 mg/hr Intravenous Continuous Juanito Doom, MD      . Derrill Memo ON 11/26/2015] pantoprazole  (PROTONIX) injection 40 mg  40 mg Intravenous Q12H Juanito Doom, MD      . piperacillin-tazobactam (ZOSYN) IVPB 3.375 g  3.375 g Intravenous 3 times per day Conrad Hoagland, RPH      . sodium chloride 0.9 % bolus 1,000 mL  1,000 mL Intravenous Once Ankit Nanavati, MD      . sodium chloride flush (NS) 0.9 % injection 3 mL  3 mL Intravenous Q12H Juanito Doom, MD      . sodium chloride flush (NS) 0.9 % injection 3 mL  3 mL Intravenous PRN Juanito Doom, MD      . traZODone (DESYREL) tablet 100 mg  100 mg Oral QHS PRN Juanito Doom, MD      . vancomycin (VANCOCIN) 1,500 mg in sodium chloride 0.9 % 500 mL IVPB  1,500 mg Intravenous Once Conrad Mooreland, RPH      . [START ON 11/23/2015] vancomycin (VANCOCIN) IVPB 1000 mg/200 mL premix  1,000 mg Intravenous Q24H Meagan Ival Bible, RPH        Allergies as of 11/22/2015  . (No Known Allergies)    Family History  Problem Relation Age of Onset  . Heart disease Father     Social History   Social History  . Marital Status: Married    Spouse Name: N/A  . Number of Children: N/A  . Years of Education: N/A   Occupational History  . Not on file.   Social History Main Topics  . Smoking status: Former Smoker    Quit date: 10/27/2013  . Smokeless tobacco: Never Used  . Alcohol Use: No  . Drug Use: No  . Sexual Activity: Not on file   Other Topics Concern  . Not on file   Social History Narrative    Review of Systems: Negative for dysphagia, heartburn, abdominal pain, constipation, or diarrhea. She does have some urgency of defecation in the morning  Physical Exam: Vital signs in last 24 hours: Temp:  [98.4 F (36.9 C)-98.9 F (37.2 C)] 98.6 F (37 C) (03/07 1700) Pulse Rate:  [68-101] 98 (03/07 1531) Resp:  [17-22] 20 (03/07 1531) BP: (116-130)/(39-56) 130/45 mmHg (03/07 1531) SpO2:  [97 %-100 %] 100 % (03/07 1531) Weight:  [63.8 kg (140 lb 10.5 oz)] 63.8 kg (140 lb 10.5 oz) (03/07 1700)   General:  This is a  reasonably well-nourished, healthy-appearing African-American female in no severe distress. Head:  Normocephalic and atraumatic. Eyes:  Sclera clear, no icterus.   Conjunctiva pale. Mouth:   No ulcerations or lesions.  Oropharynx pink & moist. Neck:   No masses or thyromegaly. Lungs:  Clear throughout to auscultation.  No wheezes, crackles, or rhonchi. No evident respiratory distress. Heart:   Regular rate and rhythm; no murmurs, clicks, rubs,  or gallops. Abdomen:  Soft, nontender,and nondistended. No masses, hepatosplenomegaly or ventral hernias noted. Normal bowel sounds, without bruits, guarding, or rebound.   Rectal:  Not performed. However, the patient had a bowel movement shortly prior to my examination, and the nurse describes it as melenic in character, no visible red blood. She was Hemoccult positive earlier.   Msk:   Symmetrical without gross deformities. 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 Left foot appears to have some chronic ischemic changes of the skin. Without clubbing, cyanosis, or edema. Neurologic:  Alert and coherent;  grossly normal neurologically. Skin: Ischemic changes of the foot as noted above Cervical Nodes:  No significant cervical adenopathy. Psych:   Alert and cooperative. Normal mood and affect.  Intake/Output from previous day:   Intake/Output this shift:    Lab Results:  Recent Labs  11/22/15 1355 11/22/15 1445  WBC 12.2* 12.1*  HGB 3.5* 3.3*  HCT 12.5* 11.9*  PLT 610* 614*   BMET  Recent Labs  11/22/15 1355  NA 136  K 4.2  CL 107  CO2 18*  GLUCOSE 177*  BUN 23*  CREATININE 0.89  CALCIUM 8.2*   LFT  Recent Labs  11/22/15 1355  PROT 7.1  ALBUMIN 2.7*  AST 24  ALT 11*  ALKPHOS 47  BILITOT 0.5   PT/INR  Recent Labs  11/22/15 1355  LABPROT 24.6*  INR 2.24*    Studies/Results: Dg Foot Complete Right  11/22/2015  CLINICAL DATA:  Right foot pain. Injury in December. Initial encounter. EXAM:  RIGHT FOOT COMPLETE - 3+ VIEW COMPARISON:  None. FINDINGS: There is no evidence of fracture or dislocation. No evidence osteolysis or periostitis. Generalized osteopenia noted. Soft tissue swelling and soft tissue gas involving the distal great toe. IMPRESSION: Soft tissue swelling and soft tissue gas involving the distal great toe. Gas-forming infection cannot be excluded. No acute osseous abnormality identified. Electronically Signed   By: Earle Gell M.D.   On: 11/22/2015 14:57    Impression: 1. Subacute GI bleed, characterized by heme positive, melenic stool but no frank hemodynamic instability 2. Chronic anticoagulation, plus aspirin therapy as risk factors for GI bleeding 3. Profound anemia, microcytic, probably acute on chronic with recent bleeding 4. Peripheral vascular disease with advanced ischemia of right foot  Plan: Endoscopic evaluation tomorrow, after the patient has had a chance to have her INR corrected and transfusion to a higher hemoglobin level.   Since she is not acutely bleeding, I feel it is in her best interest to wait until her medical condition is optimized before doing endoscopy.   I have reviewed the nature, purpose, and risks of endoscopy with the patient and she is agreeable.   I have discussed my plans and the patient's management with Dr. Lake Bells of the critical care service, who will order empiric PPI therapy as well as FFP for correction of her INR.   LOS: 0 days   Ladye Macnaughton V  11/22/2015, 5:44 PM   Pager [phone] If no answer or after 5 PM call [phone]

## 2015-11-22 NOTE — ED Notes (Signed)
EDP aware of Hgb results

## 2015-11-22 NOTE — ED Notes (Signed)
Pt placed on q89min NBP

## 2015-11-22 NOTE — ED Notes (Signed)
55ml NS 0.9% to rt ac for PRBC infusion, placed on infusion pump

## 2015-11-22 NOTE — ED Notes (Signed)
INFORMED OF HGB OF 3.5 - pt placed on cont cardiac monitoring

## 2015-11-22 NOTE — ED Notes (Signed)
UNABLE TO  DOPPLE PEDAL OR POST TIB PULSES

## 2015-11-22 NOTE — ED Notes (Signed)
Pt assisted out of wheelchair and into bed x 2 max assist, housecoat and nightgown removed, yellow fall risk socks placed, hospital gown placed and warm blankets given.

## 2015-11-22 NOTE — ED Notes (Signed)
CARE [facility] TRANSPORT TEAM AT BEDSIDE, bedside hand off report given to team members

## 2015-11-22 NOTE — ED Notes (Signed)
MD at bedside, all lab work obtained

## 2015-11-22 NOTE — ED Notes (Signed)
LACTIC ACID RESULTS TO EDP IMMEDIATELY (4.06)

## 2015-11-22 NOTE — ED Notes (Signed)
PRBC O NEGATIVE VERIFIED BY TWO (2) RN Velna Hatchet RN/ Demaris Callander)

## 2015-11-22 NOTE — ED Notes (Signed)
PERMIT OBTAINED FOR BLOOD ADMINISTRATION

## 2015-11-22 NOTE — ED Notes (Signed)
Patient transported to X-ray via stretcher, sr x 2 up

## 2015-11-22 NOTE — Consult Note (Signed)
Consult Note  Patient name: Jessica Abbott MRN: [id number] DOB: Jun 11, 1941 Sex: female  Consulting Physician:  Hospitalist service  Reason for Consult:  Chief Complaint  Patient presents with  . Leg Pain    HISTORY OF PRESENT ILLNESS: This is a 75 year old female who I evaluated on2-06-2016 for a second opinion regarding her right leg. She has a recent history of a left femoral to dorsalis pedis bypass graft for limb salvage at [facility]. Approximately 2 months ago she developed a wound on her right foot. 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 She has a chronic contracture of the right knee which she states has been there for 6 months. She does state that she can ambulate. Her right leg has always been her good leg. 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  When I saw her, she wanted to do everything possible to save her leg.  We discussed a femoral to peroneal bypass and a TMA.  She was scheduled for this coming Tuesday..   The patient suffers from diabetes which is not well controlled. She is medically managed for hypertension with an ACE inhibitor. She has chronic kidney disease. She is not on a statin. She is a former smoker. She is on Coumadin because of her pedal bypass.  She presented to [facility] today with a GI bleed and a Hb of 3.5.  Her INR is being corrected with plans for endoscopy soon  Past Medical History  Diagnosis Date  . Hypertension   . Peripheral vascular disease ([place])   . Diabetes mellitus without complication ([place])   . Arthritis   . Peripheral arterial disease ([place])   . Chronic kidney disease   . SOB (shortness of breath)     Past Surgical History  Procedure Laterality Date  . Back surgery  2002  . Abdominal hysterectomy    . Femoral artery  arteriogram  04/20/2015    Social History   Social History  . Marital Status: Married    Spouse Name: N/A  . Number of Children: N/A  . Years of Education: N/A   Occupational History  . Not on file.   Social History Main Topics  . Smoking status: Former Smoker    Quit date: 10/27/2013  . Smokeless tobacco: Never Used  . Alcohol Use: No  . Drug Use: No  . Sexual Activity: Not on file   Other Topics Concern  . Not on file   Social History Narrative    Family History  Problem Relation Age of Onset  . Heart disease Father     Allergies as of 11/22/2015  . (No Known Allergies)    No current facility-administered medications on file prior to encounter.   Current Outpatient Prescriptions on File Prior to Encounter  Medication Sig Dispense Refill  . aspirin EC 81 MG tablet Take 81 mg by mouth daily.     . Cholecalciferol (VITAMIN D3) 2000 units capsule Take 2,000 Units by mouth daily.     Marland Kitchen HYDROcodone-acetaminophen (NORCO/VICODIN) 5-325 MG tablet Take 1 tablet by mouth every 6 (six) hours as needed (pain). Reported on 10/28/2015       REVIEW OF SYSTEMS: Constitutional: weight loss, gain, night sweats, Fevers, chills, fatigue .  HEENT: headaches, Sore throat, sneezing, nasal congestion, post nasal drip, Difficulty swallowing, Tooth/dental problems,  visual complaints visual changes, ear ache CV: chest pain, radiates: ,Orthopnea, PND, swelling in lower extremities, dizziness, palpitations, syncope.  GI heartburn, indigestion, abdominal pain, nausea, vomiting, diarrhea, change in bowel habits, loss of appetite, melena  Resp: cough, productive: , hemoptysis, dyspnea, chest pain, pleuritic.  Skin: rash or itching or icterus GU: dysuria, change in color of urine, urgency or frequency. flank pain, hematuria  MS: joint pain or swelling. decreased range of motion  Psych: change in mood or affect. depression or anxiety.  Neuro: difficulty with speech, weakness,  numbness, ataxia   PHYSICAL EXAMINATION: General: The patient appears their stated age.  Vital signs are BP 127/48 mmHg  Pulse 96  Temp(Src) 98.8 F (37.1 C) (Oral)  Resp 21  Ht 5\' 2"  (1.575 m)  Wt 140 lb 10.5 oz (63.8 kg)  BMI 25.72 kg/m2  SpO2 97% Pulmonary: Respirations are non-labored HEENT:  No gross abnormalities Abdomen: Soft and non-tender  Musculoskeletal: There are no major deformities.   Neurologic: No focal weakness or paresthesias are detected, Skin: Ischemic changes to right forefoot Psychiatric: The patient has normal affect. Cardiovascular: There is a regular rate and rhythm without significant murmur appreciated. No palpable pedal pulse on right  Diagnostic Studies: none  Assessment:  #1:  GI bleed #2 PAD with ulcer to right leg Plan: I discussed with the patient and her family that given her recent GI bleed, and the appearance of her foot, I am leaning towards primary amputation.  With her co-morbidities, I am afraid she is too high risk for limb salvage surgery.   I will continue to have these discussion with her, however I think primary amputation is the best course of action currently.  This is not urgent and can wait until she fully recovers for her GI bleed     V. Leia Alf, M.D. [facility] Office: [phone] Pager:  [phone]

## 2015-11-22 NOTE — ED Provider Notes (Addendum)
CSN: [id number]     Arrival date & time 11/22/15  1315 History   First MD Initiated Contact with Patient 11/22/15 1405     Chief Complaint  Patient presents with  . Leg Pain     (Consider location/radiation/quality/duration/timing/severity/associated sxs/prior Treatment) HPI Comments: Pt with hx of arterial insufficiency, HTN, DM comes in with cc of R leg pain. Pt has hx of poor healing wound in the foot and is due for a vascular surgery next week. She reports that over the last 2 days, pt is having increased pain in the foot. She also has noticed more toes are getting dark and more area is now darker. Pt has no fevers, chills, nausea, emesis. She is also noted to have low Hb. She is on aspirin and coumadin (latter due to vascular dz/thrombosis). She has been having darker stools as of late. No abd pain. No hx of Gi bleed.   ROS 10 Systems reviewed and are negative for acute change except as noted in the HPI.     The history is provided by the patient and medical records.    Past Medical History  Diagnosis Date  . Hypertension   . Peripheral vascular disease ([place])   . Diabetes mellitus without complication ([place])   . Arthritis   . Peripheral arterial disease ([place])   . Chronic kidney disease   . SOB (shortness of breath)    Past Surgical History  Procedure Laterality Date  . Back surgery  2002  . Abdominal hysterectomy    . Femoral artery arteriogram  04/20/2015   Family History  Problem Relation Age of Onset  . Heart disease Father    Social History  Substance Use Topics  . Smoking status: Former Smoker    Quit date: 10/27/2013  . Smokeless tobacco: Never Used  . Alcohol Use: No   OB History    No data available     Review of Systems    Allergies  Review of patient's allergies indicates no known allergies.  Home Medications   Prior to Admission medications   Medication Sig Start Date End Date Taking? Authorizing Provider  amLODipine (NORVASC) 5 MG tablet  Take 5 mg by mouth. 09/27/15   Historical Provider, MD  aspirin EC 81 MG tablet Take 81 mg by mouth.    Historical Provider, MD  baclofen (LIORESAL) 10 MG tablet TAKE ONE-HALF TABLET BY MOUTH IN THE MORNING AND ONE-HALF TABLET WITH LUNCH AND ONE TABLET AT BEDTIME AS NEEDED FOR SEVERE PAIN 10/06/15   Historical Provider, MD  Cholecalciferol (VITAMIN D3) 2000 units capsule Take by mouth.    Historical Provider, MD  HYDROcodone-acetaminophen (NORCO/VICODIN) 5-325 MG tablet Take by mouth. Reported on 10/28/2015 09/08/15   Historical Provider, MD  ipratropium-albuterol (DUONEB) 0.5-2.5 (3) MG/3ML SOLN 3 mLs.    Historical Provider, MD  lisinopril (PRINIVIL,ZESTRIL) 20 MG tablet Take 20 mg by mouth.    Historical Provider, MD  metFORMIN (GLUCOPHAGE) 500 MG tablet TAKE ONE TABLET BY MOUTH IN THE MORNING 09/22/15   Historical Provider, MD  polyethylene glycol (MIRALAX / GLYCOLAX) packet Take 17 g by mouth.    Historical Provider, MD  warfarin (COUMADIN) 4 MG tablet Take 4 mg by mouth.    Historical Provider, MD   BP 116/45 mmHg  Pulse 94  Temp(Src) 98.9 F (37.2 C) (Oral)  Resp 20  SpO2 100% Physical Exam  Constitutional: She is oriented to person, place, and time. She appears well-developed.  HENT:  Head: Normocephalic and atraumatic.  Eyes: Conjunctivae and EOM are normal. Pupils are equal, round, and reactive to light.  Neck: Normal range of motion. Neck supple.  Cardiovascular: Normal rate and regular rhythm.   Murmur heard. Pulmonary/Chest: Effort normal and breath sounds normal. No respiratory distress.  Abdominal: Soft. Bowel sounds are normal. She exhibits no distension. There is no tenderness. There is no rebound and no guarding.  Musculoskeletal:  Please see the picture of the gangrenous foot. The wound is foul smelling, no discharge. Pt has no palpable or dopplerable DP, PT, femoral pulse is palpable, popliteal is faint.  Neurological: She is alert and oriented to person, place, and time.    Skin: Skin is warm and dry.  Nursing note and vitals reviewed.   ED Course  Procedures (including critical care time)      Labs Review Labs Reviewed  COMPREHENSIVE METABOLIC PANEL - Abnormal; Notable for the following:    CO2 18 (*)    Glucose, Bld 177 (*)    BUN 23 (*)    Calcium 8.2 (*)    Albumin 2.7 (*)    ALT 11 (*)    All other components within normal limits  CBC WITH DIFFERENTIAL/PLATELET - Abnormal; Notable for the following:    RBC 1.85 (*)    Hemoglobin 3.5 (*)    HCT 12.5 (*)    MCV 67.6 (*)    MCH 18.9 (*)    MCHC 28.0 (*)    RDW 18.6 (*)    Platelets 610 (*)    All other components within normal limits  PROTIME-INR - Abnormal; Notable for the following:    Prothrombin Time 24.6 (*)    INR 2.24 (*)    All other components within normal limits  I-STAT CG4 LACTIC ACID, ED - Abnormal; Notable for the following:    Lactic Acid, Venous 4.06 (*)    All other components within normal limits  CULTURE, BLOOD (SINGLE)  CULTURE, BLOOD (SINGLE)  URINE CULTURE  SEDIMENTATION RATE  CBC  OCCULT BLOOD X 1 CARD TO LAB, STOOL  URINALYSIS, ROUTINE W REFLEX MICROSCOPIC (NOT AT [facility])  POC OCCULT BLOOD, ED  TYPE AND SCREEN  ABO/RH  PREPARE RBC (CROSSMATCH)    Imaging Review Dg Foot Complete Right  11/22/2015  CLINICAL DATA:  Right foot pain. Injury in December. Initial encounter. EXAM: RIGHT FOOT COMPLETE - 3+ VIEW COMPARISON:  None. FINDINGS: There is no evidence of fracture or dislocation. No evidence osteolysis or periostitis. Generalized osteopenia noted. Soft tissue swelling and soft tissue gas involving the distal great toe. IMPRESSION: Soft tissue swelling and soft tissue gas involving the distal great toe. Gas-forming infection cannot be excluded. No acute osseous abnormality identified. Electronically Signed   By: Earle Gell M.D.   On: 11/22/2015 14:57   I have personally reviewed and evaluated these images and lab results as part of my medical  decision-making.   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 She has chronic arterial insufficiency on the R side, Dr. Trula Slade is to operate next week. Family reports that the pain has gotten worse the last 2 days and it is at it's worst. She also has increased area of gangrene.   - Spoke with Dr. Trula Slade - he recommends medical admission and optimization for now, he will assess the patient. No emergent surgery planned most likely. Transfer to [facility].  Labs shows acute anemia, Hb is 3.5. Pt has melena. We will release our 1 unit of Oneg. Blood. Protonix 40 mg ivp given. Vit K subcutaneous given. 2 iv established. Might need better access. GI consulted - Dr. Carma Leaven aware, and he will see her when patient gets to [facility]. BUN is 20 - unlikely UGIB.  CCM to admit, ER to ER transfer - Dr. Lake Bells and Dr. Roxanne Mins aware that patient will need: 1- type and screen 2- blood transfusion 3- Vancomycin (zosyn started here) 4. Repeat lactate will be needed 5. warfarin reversal if needed  Xray of the foot shows soft tissue gas - so vanc and zosyn started. Code sepsis activated. The lactic acidosis is multifactorial, and a big component is likely from anemia - followed by ischemia and foot infection. PT WILL GET 1.5 LITERS IV FLUID IN THE ER + 500 CC OF BLOOD -  So she will have received access of 30 cc/kg of fluids from the  ER.   CRITICAL CARE Performed by: Varney Biles   Total critical care time: 45 minutes  Critical care time was exclusive of separately billable procedures and treating other patients.  Critical care was necessary to treat or prevent imminent or life-threatening deterioration.  Critical care was time spent personally by me on the following activities: development of treatment plan with patient and/or surrogate as well as nursing, discussions with consultants, evaluation of patient's response to treatment, examination of patient, obtaining history from patient or surrogate, ordering and performing treatments and interventions, ordering and review of laboratory studies, ordering and review of radiographic studies, pulse oximetry and re-evaluation of patient's condition.    Varney Biles, MD 11/22/15 [place], MD 11/23/15 [id number]

## 2015-11-22 NOTE — Progress Notes (Signed)
Pharmacy Antibiotic Note  Shanterica Kraning is a 75 y.o. female admitted on 11/22/2015 with wound infection.  Pharmacy has been consulted for Vancomycin and Zosyn dosing. CBC pending, afebrile, eCrCl ~11mL/min   Plan: Vancomycin 1500mg  x1 then,  Vancomycin 1000mg  IV Q24 hours  Zosyn 3.375g IV (over 30 min) x1 Zosyn 3.375g IV Q8h (4hr inf) F/U renal fxn, c/s, VT @ss      Temp (24hrs), Avg:98.7 F (37.1 C), Min:98.4 F (36.9 C), Max:98.9 F (37.2 C)   Recent Labs Lab 11/22/15 1355 11/22/15 1423  WBC 12.2*  --   CREATININE 0.89  --   LATICACIDVEN  --  4.06*    CrCl cannot be calculated (Unknown ideal weight.).    No Known Allergies  Antimicrobials this admission: 3/7 vanc >> 3/7 Zosyn>>  Thank you for allowing pharmacy to be a part of this patient's care.  Kayliee Atienza C. Lennox Grumbles, PharmD Pharmacy Resident  Pager: [phone] 11/22/2015 3:19 PM

## 2015-11-22 NOTE — ED Notes (Signed)
Hand-Off Phone Report to [place] in MICU at [facility]

## 2015-11-22 NOTE — ED Notes (Signed)
2lpm via [place] initiated

## 2015-11-22 NOTE — ED Notes (Signed)
NS infusing via gravity at [facility]

## 2015-11-22 NOTE — ED Notes (Signed)
Order rec for reck of CBC

## 2015-11-22 NOTE — ED Notes (Signed)
Pt c/o increased pain in RLE , primary at rt toes, toes noted to be black in color, foul smelling odor also noted.

## 2015-11-23 ENCOUNTER — Encounter (HOSPITAL_COMMUNITY): Payer: Self-pay

## 2015-11-23 ENCOUNTER — Inpatient Hospital Stay (HOSPITAL_COMMUNITY): Payer: Medicare Other | Admitting: Certified Registered Nurse Anesthetist

## 2015-11-23 ENCOUNTER — Encounter (HOSPITAL_COMMUNITY)
Admission: EM | Disposition: A | Discharge status: Home / Self Care | Payer: Self-pay | Source: Home / Self Care | Attending: Internal Medicine

## 2015-11-23 DIAGNOSIS — I959 Hypotension, unspecified: Secondary | ICD-10-CM

## 2015-11-23 DIAGNOSIS — I70234 Atherosclerosis of native arteries of right leg with ulceration of heel and midfoot: Secondary | ICD-10-CM

## 2015-11-23 HISTORY — PX: ESOPHAGOGASTRODUODENOSCOPY (EGD) WITH PROPOFOL: SHX5813

## 2015-11-23 LAB — BASIC METABOLIC PANEL
ANION GAP: 7 (ref 5–15)
BUN: 13 mg/dL (ref 6–20)
CO2: 18 mmol/L — AB (ref 22–32)
Calcium: 8.1 mg/dL — ABNORMAL LOW (ref 8.9–10.3)
Chloride: 114 mmol/L — ABNORMAL HIGH (ref 101–111)
Creatinine, Ser: 0.71 mg/dL (ref 0.44–1.00)
GFR calc Af Amer: >60 mL/min (ref >60)
GFR calc non Af Amer: >60 mL/min (ref >60)
GLUCOSE: 125 mg/dL — AB (ref 65–99)
POTASSIUM: 4.2 mmol/L (ref 3.5–5.1)
Sodium: 139 mmol/L (ref 135–145)

## 2015-11-23 LAB — GLUCOSE, CAPILLARY
GLUCOSE-CAPILLARY: 106 mg/dL — AB (ref 65–99)
GLUCOSE-CAPILLARY: 173 mg/dL — AB (ref 65–99)
Glucose-Capillary: 114 mg/dL — ABNORMAL HIGH (ref 65–99)
Glucose-Capillary: 113 mg/dL — ABNORMAL HIGH (ref 65–99)

## 2015-11-23 LAB — PREPARE FRESH FROZEN PLASMA
UNIT DIVISION: 0
Unit division: 0

## 2015-11-23 LAB — TYPE AND SCREEN
ABO/RH(D): A POS
Antibody Screen: NEGATIVE
Unit division: 0

## 2015-11-23 LAB — ABO/RH: ABO/RH(D): A POS

## 2015-11-23 LAB — PROTIME-INR
INR: 1.66 — AB (ref 0.00–1.49)
Prothrombin Time: 19.6 seconds — ABNORMAL HIGH (ref 11.6–15.2)

## 2015-11-23 LAB — CBC
HEMATOCRIT: 24.5 % — AB (ref 36.0–46.0)
HEMOGLOBIN: 7.7 g/dL — AB (ref 12.0–15.0)
MCH: 23.4 pg — AB (ref 26.0–34.0)
MCHC: 31.4 g/dL (ref 30.0–36.0)
MCV: 74.5 fL — ABNORMAL LOW (ref 78.0–100.0)
Platelets: 423 10*3/uL — ABNORMAL HIGH (ref 150–400)
RBC: 3.29 MIL/uL — ABNORMAL LOW (ref 3.87–5.11)
RDW: 21.5 % — ABNORMAL HIGH (ref 11.5–15.5)
WBC: 9.7 10*3/uL (ref 4.0–10.5)

## 2015-11-23 LAB — PREPARE RBC (CROSSMATCH)

## 2015-11-23 LAB — LACTIC ACID, PLASMA: Lactic Acid, Venous: 1.2 mmol/L (ref 0.5–2.0)

## 2015-11-23 SURGERY — ESOPHAGOGASTRODUODENOSCOPY (EGD) WITH PROPOFOL
Anesthesia: Monitor Anesthesia Care

## 2015-11-23 MED ORDER — PROPOFOL 500 MG/50ML IV EMUL
INTRAVENOUS | Status: DC | PRN
  Administered 2015-11-23: 75 ug/kg/min via INTRAVENOUS

## 2015-11-23 MED ORDER — PEG 3350-KCL-NA BICARB-NACL 420 G PO SOLR
4000.0000 mL | Freq: Once | ORAL | Status: AC
  Administered 2015-11-23: 4000 mL via ORAL
  Filled 2015-11-23: qty 4000

## 2015-11-23 MED ORDER — MAGNESIUM HYDROXIDE 400 MG/5ML PO SUSP
30.0000 mL | Freq: Once | ORAL | Status: DC
  Filled 2015-11-23: qty 30

## 2015-11-23 MED ORDER — HYDROCODONE-ACETAMINOPHEN 5-325 MG PO TABS
1.0000 | ORAL_TABLET | ORAL | Status: DC | PRN
Start: 2015-11-23 — End: 2015-11-29
  Administered 2015-11-23 – 2015-11-27 (×10): 1 via ORAL
  Filled 2015-11-23 (×10): qty 1

## 2015-11-23 MED ORDER — PROPOFOL 10 MG/ML IV BOLUS
INTRAVENOUS | Status: DC | PRN
  Administered 2015-11-23: 20 mg via INTRAVENOUS

## 2015-11-23 MED ORDER — LACTATED RINGERS IV SOLN
INTRAVENOUS | Status: DC
  Administered 2015-11-23: 1000 mL via INTRAVENOUS
  Administered 2015-11-23: 09:00:00 via INTRAVENOUS

## 2015-11-23 MED ORDER — BUTAMBEN-TETRACAINE-BENZOCAINE 2-2-14 % EX AERO
INHALATION_SPRAY | CUTANEOUS | Status: DC | PRN
  Administered 2015-11-23: 2 via TOPICAL

## 2015-11-23 MED ORDER — SENNOSIDES 8.8 MG/5ML PO SYRP
5.0000 mL | ORAL_SOLUTION | Freq: Once | ORAL | Status: DC
  Filled 2015-11-23: qty 5

## 2015-11-23 MED ORDER — FENTANYL CITRATE (PF) 250 MCG/5ML IJ SOLN
INTRAMUSCULAR | Status: DC | PRN
  Administered 2015-11-23 (×2): 25 ug via INTRAVENOUS

## 2015-11-23 NOTE — Transfer of Care (Signed)
Immediate Anesthesia Transfer of Care Note  Patient: Jessica Abbott  Procedure(s) Performed: Procedure(s): ESOPHAGOGASTRODUODENOSCOPY (EGD) WITH PROPOFOL (N/A)  Patient Location: Endoscopy Unit  Anesthesia Type:MAC  Level of Consciousness: awake, alert  and oriented  Airway & Oxygen Therapy: Patient Spontanous Breathing and Patient connected to nasal cannula oxygen  Post-op Assessment: Report given to RN and Post -op Vital signs reviewed and stable  Post vital signs: Reviewed and stable  Last Vitals:  Filed Vitals:   11/23/15 0802 11/23/15 0923  BP: 164/53 142/56  Pulse: 96 96  Temp: 37.1 C   Resp: 19 21    Complications: No apparent anesthesia complications

## 2015-11-23 NOTE — Anesthesia Postprocedure Evaluation (Signed)
Anesthesia Post Note  Patient: Lucienne Ambs  Procedure(s) Performed: Procedure(s) (LRB): ESOPHAGOGASTRODUODENOSCOPY (EGD) WITH PROPOFOL (N/A)  Patient location during evaluation: PACU Anesthesia Type: MAC Level of consciousness: awake and alert Pain management: pain level controlled Vital Signs Assessment: post-procedure vital signs reviewed and stable Respiratory status: spontaneous breathing, nonlabored ventilation, respiratory function stable and patient connected to nasal cannula oxygen Cardiovascular status: stable and blood pressure returned to baseline Anesthetic complications: no    Last Vitals:  Filed Vitals:   11/23/15 0802 11/23/15 0923  BP: 164/53 142/56  Pulse: 96 96  Temp: 37.1 C   Resp: 19 21    Last Pain:  Filed Vitals:   11/23/15 0923  PainSc: 10-Worst pain ever                 Zenaida Deed

## 2015-11-23 NOTE — Anesthesia Preprocedure Evaluation (Addendum)
Anesthesia Evaluation  Patient identified by MRN, date of birth, ID band Patient awake    Reviewed: Allergy & Precautions, H&P , NPO status , Patient's Chart, lab work & pertinent test results  History of Anesthesia Complications Negative for: history of anesthetic complications  Airway Mallampati: I  TM Distance: >3 FB Neck ROM: full    Dental  (+) Edentulous Upper, Edentulous Lower, Dental Advisory Given   Pulmonary neg pulmonary ROS, former smoker,    Pulmonary exam normal breath sounds clear to auscultation       Cardiovascular hypertension, + Peripheral Vascular Disease  Normal cardiovascular exam Rhythm:regular Rate:Normal  No known heart history and denies angina, syncope, dyspnea   Neuro/Psych negative neurological ROS     GI/Hepatic negative GI ROS, Neg liver ROS,   Endo/Other  negative endocrine ROSdiabetes  Renal/GU CRFRenal diseasenegative Renal ROS     Musculoskeletal  (+) Arthritis ,   Abdominal   Peds  Hematology negative hematology ROS (+) anemia ,   Anesthesia Other Findings Patient currently with 1 month of melena, came in with Hgb 3.5 yesterday, s/p 2u RBC with appropriate response with Hgb 7.7 this am, INR originallyh >2 now s/p 2u FFP is 1.6.. Appears stable with no active bleeding  Reproductive/Obstetrics negative OB ROS                           Anesthesia Physical Anesthesia Plan  ASA: IV  Anesthesia Plan: MAC   Post-op Pain Management:    Induction: Intravenous  Airway Management Planned: Nasal Cannula  Additional Equipment:   Intra-op Plan:   Post-operative Plan:   Informed Consent: I have reviewed the patients History and Physical, chart, labs and discussed the procedure including the risks, benefits and alternatives for the proposed anesthesia with the patient or authorized representative who has indicated his/her understanding and acceptance.    Dental Advisory Given  Plan Discussed with: Anesthesiologist, CRNA and Surgeon  Anesthesia Plan Comments: (Will make sure blood is available if needed)        Anesthesia Quick Evaluation

## 2015-11-23 NOTE — Addendum Note (Signed)
Addendum  created 11/23/15 0941 by Jillyn Hidden, MD   Modules edited: Clinical Notes   Clinical Notes:  File: [id number]

## 2015-11-23 NOTE — Op Note (Signed)
[facility] Patient Name: Jessica Abbott Procedure Date : 11/23/2015 MRN: [id number] Attending MD: Ronald Lobo , MD Date of Birth: 09-12-1941 CSN: [id number] Age: 75 Admit Type: Inpatient Procedure:            Upper GI endoscopy Indications:          Iron deficiency anemia secondary to chronic blood                        loss--presented w/ hgb 3.3 while on ASA and coumadin,                        stool dark, heme + Providers:            Ronald Lobo, MD, Carolynn Comment, RN, Elspeth Cho, Technician Referring MD:          Medicines:            See the Anesthesia note for documentation of the                        administered medications Complications:        No immediate complications. Estimated Blood Loss: Estimated blood loss: none. Procedure:            Pre-Anesthesia Assessment:                       - Prior to the procedure, a History and Physical was                        performed, and patient medications and allergies were                        reviewed. [name] tolerance of previous                        anesthesia was also reviewed. The risks and benefits of                        the procedure and the sedation options and risks were                        discussed with the patient. All questions were                        answered, and informed consent was obtained. Prior                        Anticoagulants: The patient has taken Coumadin                        (warfarin), last dose was 1 day prior to procedure. ASA                        Grade Assessment: IV - A patient with severe systemic                        disease that is a constant threat to life. After  reviewing the risks and benefits, the patient was                        deemed in satisfactory condition to undergo the                        procedure.                       - Prior Anticoagulants: The patient has taken aspirin,                         last dose was 2 days prior to procedure.                       After obtaining informed consent, the endoscope was                        passed under direct vision. Throughout the procedure,                        the patient's blood pressure, pulse, and oxygen                        saturations were monitored continuously. The [id number]                        [id number]) scope was introduced through the mouth, and                        advanced to the third part of duodenum. The upper GI                        endoscopy was accomplished without difficulty. The                        patient tolerated the procedure well. Scope In: Scope Out: Scope Withdrawal Time: Total Procedure Duration: Findings:      The larynx was normal.      The examined esophagus was normal.      A 1 cm hiatal hernia was present.      The entire examined stomach was normal. Careful exam showed on ASA       gastropathy.      The cardia and gastric fundus were normal on retroflexion.      There is no endoscopic evidence of erythema or inflammatory changes       suggestive of gastritis, ulceration, angiodysplasia or erosion in the       entire examined stomach.      The examined duodenum was normal on careful, repeated inspection. Impression:           - Normal larynx.                       - Normal esophagus.                       - 1 cm hiatal hernia.                       - Normal stomach.                       -  Normal examined duodenum.                       - No specimens collected.                       - No source of profound anemia seen, despite careful                        exam. Moderate Sedation:      Moderate (conscious) sedation was personally administered by an       anesthesia professional. The following parameters were monitored: oxygen       saturation, heart rate, blood pressure, and response to care. Recommendation:       - Patient has a contact number available for                         emergencies. The signs and symptoms of potential                        delayed complications were discussed with the patient.                        Return to normal activities tomorrow. Written discharge                        instructions were provided to the patient.                       - Resume previous diet.                       - Continue present medications.                       - Will discuss w/ other team members whether to proceed                        to colonoscopy and/or capsule endoscopy. Procedure Code(s):    --- Professional ---                       [phone], Esophagogastroduodenoscopy, flexible, transoral;                        diagnostic, including collection of specimen(s) by                        brushing or washing, when performed (separate procedure) Diagnosis Code(s):    --- Professional ---                       D50.0, Iron deficiency anemia secondary to blood loss                        (chronic) CPT copyright 2016 [employer]. All rights reserved. The codes documented in this report are preliminary and upon coder review may  be revised to meet current compliance requirements. Attending Participation: Ronald Lobo, MD  Ronald Lobo, MD 11/23/2015 9:35:21 AM This report has been signed electronically. Number of Addenda: 0

## 2015-11-23 NOTE — Progress Notes (Signed)
PULMONARY / CRITICAL CARE MEDICINE   Name: Jessica Abbott MRN: [id number] DOB: 03/06/1941    ADMISSION DATE:  11/22/2015 CONSULTATION DATE: 11/22/15  REFERRING MD:  ED  CHIEF COMPLAINT: C/O increased weakness  HISTORY OF PRESENT ILLNESS:   Jessica Abbott is a 75 year old female with PMH significant for Hypertension, DM without complication, chronic kidney disease, Peripheral vascular disease, arthritis,SOB. She is chronically in wheelchair and has not walked for several months. She is unable to straighten out her R leg. Injured her foot in December and has been followed by vascular surgery and was scheduled to go to OR 3/14 for right femoral peroneal bypass grafting and transmetatarsal amputation. She has also been on coumadin due to this severe vascular disease and started having black stools about 2 weeks ago, she reported this at coumadin clinic and apparently the way she described her stool was not alarming.  Over the past few days her weakness has significantly progressed, which caused her to present to ED at [facility]. In the ED on examination by the nurse it was noted that her right toes was black in color with foul odor and were unable to doppler her pedal or Tibial pulse. Upon arrival to ED her labs shows acute anaemia with Hb-3.3, WBC-12.1,platelets-614. X-Ray of foot also shows soft tissue gas and code sepsis was activated. Patient was given 1.5 liters fluid in the ER and 500 cc of blood.She was also started on Van/Zosyn. Patient is O neg and the blood bank only has 2 units of blood in the reserve and therefore patient is transferred on 3/7 to [facility].  SUBJECTIVE:   No events overnight, groggy from sedation for EGD but responding appropriately.  VITAL SIGNS: BP 124/56 mmHg  Pulse 81  Temp(Src) 99.4 F (37.4 C) (Oral)  Resp 17  Ht 5\' 2"  (1.575 m)  Wt 63.504 kg (140 lb)  BMI 25.60 kg/m2  SpO2 98%  HEMODYNAMICS:    VENTILATOR SETTINGS:    INTAKE / OUTPUT: I/O last 3  completed shifts: In: 2858.5 [I.V.:1187.5; Blood:1171; IV Piggyback:500] Out: -   PHYSICAL EXAMINATION: General:  Chronically ill appearing female in NAD Neuro:  Alert, oriented, non-focal HEENT:  [place]/AT PERRL no JVD Cardiovascular:  RRR no MRG Lungs:  Clear bilateral breath sounds Abdomen:  Soft, non-tender, non-distended Musculoskeletal:  Chronic contracture of RLE Skin:  Ischemic changes to R foot.  LABS:  BMET  Recent Labs Lab 11/22/15 1355 11/23/15 0451  NA 136 139  K 4.2 4.2  CL 107 114*  CO2 18* 18*  BUN 23* 13  CREATININE 0.89 0.71  GLUCOSE 177* 125*   Electrolytes  Recent Labs Lab 11/22/15 1355 11/23/15 0451  CALCIUM 8.2* 8.1*   CBC  Recent Labs Lab 11/22/15 1355 11/22/15 1445 11/23/15 0451  WBC 12.2* 12.1* 9.7  HGB 3.5* 3.3* 7.7*  HCT 12.5* 11.9* 24.5*  PLT 610* 614* 423*   Coag's  Recent Labs Lab 11/22/15 1355 11/23/15 0451  INR 2.24* 1.66*   Sepsis Markers  Recent Labs Lab 11/22/15 1423 11/23/15 0451  LATICACIDVEN 4.06* 1.2   ABG No results for input(s): PHART, PCO2ART, PO2ART in the last 168 hours.  Liver Enzymes  Recent Labs Lab 11/22/15 1355  AST 24  ALT 11*  ALKPHOS 47  BILITOT 0.5  ALBUMIN 2.7*   Cardiac Enzymes No results for input(s): TROPONINI, PROBNP in the last 168 hours.  Glucose  Recent Labs Lab 11/22/15 1739 11/22/15 2022 11/22/15 2337 11/23/15 0421  GLUCAP 100* 106* 114*  106*   Imaging Dg Foot Complete Right  11/22/2015  CLINICAL DATA:  Right foot pain. Injury in December. Initial encounter. EXAM: RIGHT FOOT COMPLETE - 3+ VIEW COMPARISON:  None. FINDINGS: There is no evidence of fracture or dislocation. No evidence osteolysis or periostitis. Generalized osteopenia noted. Soft tissue swelling and soft tissue gas involving the distal great toe. IMPRESSION: Soft tissue swelling and soft tissue gas involving the distal great toe. Gas-forming infection cannot be excluded. No acute osseous abnormality  identified. Electronically Signed   By: Earle Gell M.D.   On: 11/22/2015 14:57   STUDIES:    CULTURES: BCx2 3/7 >   ANTIBIOTICS:  3/7 Vancomycin  3/7 Zosyn  SIGNIFICANT EVENTS: 3/7 admit  LINES/TUBES:  I reviewed CXR myself, clear.  DISCUSSION: 75 year old female with multiple medical problems including severe PAD with RLE ischemia followed by Dr. Trula Slade. Scheduled for surgery 3/14. On coumadin reportedly for PAD. Melena x 2 weeks with increasing weakness. Presented ED 3/7 with this complaint. Hemoglobin 3.3. INR 2.24. Will transfuse PRBC and FFP. Protonix infusion. GI likely scope 3/8.  ASSESSMENT / PLAN:  PULMONARY A: No acute issues  P:   Supplemental O2 as needed to keep SpO2 > 95%  CARDIOVASCULAR A:  Severe PVD on Coumadin Elevated lactic likely secondary to ischemic R foot. Hx of Hypertension  P:  Telemetry monitoring Holding home Norvasc, asa, lisinopril for now S/p 1 u PRBC, will order 2 additional Consult vascular, called.  RENAL A:   CKD  P:   Trend BUN/Cr  GASTROINTESTINAL A:   Likely upper GI bleeding  P:   Diet per GI. Protonix infusion EGD normal, diet started by GI, GI will discuss with vascular importance of anticoagulation, will defer to GI at this time.  HEMATOLOGIC A:   Acute blood loss anemia Coagulopathy secondary to warfarin Melena  P:  Transfused 2 units FFP Vit K given in ED Trend CBC/Coags tonight and in AM Monitor fever curve INR and CBC in AM.  INFECTIOUS A:   R foot ischemia with gas on CXR Peripheral Vascular Disease  P:   Hold ABX for now Trend Procalcitonin Follow WBC and fever curve  ENDOCRINE A:   DM  P:   SSI coverage  NEUROLOGIC A:   Pain management  P:   PRN morphine Continue home baclofen Monitor  Transfer to SDU and to [facility] service with PCCM off 3/9.  Discussed with TRH-MD.  Rush Farmer, M.D. [facility] Pulmonary/Critical Care Medicine. Pager: [phone]. After hours pager:  [phone].  11/23/2015 1:03 PM

## 2015-11-23 NOTE — Interval H&P Note (Signed)
History and Physical Interval Note:  11/23/2015 8:56 AM  Jessica Abbott  has presented today for surgery, with the diagnosis of Gastrointestinal bleeding  The various methods of treatment have been discussed with the patient and family. After consideration of risks, benefits and other options for treatment, the patient has consented to  Procedure(s): ESOPHAGOGASTRODUODENOSCOPY (EGD) WITH PROPOFOL (N/A) as a surgical intervention .  [name] history has been reviewed, patient examined, no change in status, stable for surgery.  I have reviewed the patient's chart and labs.  Questions were answered to the patient's satisfaction.     Cleotis Nipper

## 2015-11-23 NOTE — Progress Notes (Signed)
EGD well-tolerated by patient.  To my surprise, the exam was completely normal, without any source of patient's profound anemia or heme positivity.  This, along with her good tolerance of a profoundly low hemoglobin, and the microcytic indices on her presenting hemogram, suggest an element of chronicity, and a possible lower tract source for the anemia. In other words, I am concerned she may be harboring some form of colonic neoplasm.  I have talked with Dr. Trula Slade of vascular surgery and he would find the information of colonoscopy very helpful, because the presence of a neoplasm, if one were truly there, would absolutely lead him away from doing a reconstructive procedure and wouldn't pushing in favor of simple amputation.  I will discuss the case further with the critical care team later today. In the meantime, I will have the patient on a clear liquid diet and administer some milk of magnesia to initiate a possible colonoscopy prep process.  Cleotis Nipper, M.D. Pager [phone] If no answer or after 5 PM call [phone]

## 2015-11-23 NOTE — Progress Notes (Signed)
Dr. Pura Spice note reviewed; discussed w/ Dr. Trula Slade;  will proceed w/ colonoscopy tomorrow.  Petra Kuba, purpose, risks alternatives reviewed w/ pt and she is agreeable.   Cleotis Nipper, M.D. Pager [phone] If no answer or after 5 PM call [phone]

## 2015-11-24 ENCOUNTER — Encounter (HOSPITAL_COMMUNITY): Payer: Self-pay

## 2015-11-24 ENCOUNTER — Other Ambulatory Visit (HOSPITAL_COMMUNITY): Payer: Medicare Other

## 2015-11-24 ENCOUNTER — Encounter (HOSPITAL_COMMUNITY)
Admission: EM | Disposition: A | Discharge status: Home / Self Care | Payer: Self-pay | Source: Home / Self Care | Attending: Internal Medicine

## 2015-11-24 DIAGNOSIS — N179 Acute kidney failure, unspecified: Secondary | ICD-10-CM | POA: Diagnosis present

## 2015-11-24 DIAGNOSIS — E872 Acidosis, unspecified: Secondary | ICD-10-CM | POA: Diagnosis present

## 2015-11-24 DIAGNOSIS — D62 Acute posthemorrhagic anemia: Secondary | ICD-10-CM | POA: Diagnosis present

## 2015-11-24 DIAGNOSIS — K921 Melena: Secondary | ICD-10-CM | POA: Diagnosis present

## 2015-11-24 DIAGNOSIS — I96 Gangrene, not elsewhere classified: Secondary | ICD-10-CM | POA: Diagnosis present

## 2015-11-24 HISTORY — PX: COLONOSCOPY: SHX5424

## 2015-11-24 LAB — HEMOGLOBIN AND HEMATOCRIT, BLOOD
HEMATOCRIT: 31.1 % — AB (ref 36.0–46.0)
HEMATOCRIT: 29.4 % — AB (ref 36.0–46.0)
HEMOGLOBIN: 9.9 g/dL — AB (ref 12.0–15.0)
HEMOGLOBIN: 9.7 g/dL — AB (ref 12.0–15.0)

## 2015-11-24 LAB — GLUCOSE, CAPILLARY
GLUCOSE-CAPILLARY: 131 mg/dL — AB (ref 65–99)
GLUCOSE-CAPILLARY: 115 mg/dL — AB (ref 65–99)
Glucose-Capillary: 125 mg/dL — ABNORMAL HIGH (ref 65–99)
Glucose-Capillary: 128 mg/dL — ABNORMAL HIGH (ref 65–99)
Glucose-Capillary: 139 mg/dL — ABNORMAL HIGH (ref 65–99)
Glucose-Capillary: 96 mg/dL (ref 65–99)

## 2015-11-24 LAB — CBC
HCT: 20.9 % — ABNORMAL LOW (ref 36.0–46.0)
Hemoglobin: 6.7 g/dL — CL (ref 12.0–15.0)
MCH: 24 pg — AB (ref 26.0–34.0)
MCHC: 32.1 g/dL (ref 30.0–36.0)
MCV: 74.9 fL — AB (ref 78.0–100.0)
PLATELETS: 360 10*3/uL (ref 150–400)
RBC: 2.79 MIL/uL — ABNORMAL LOW (ref 3.87–5.11)
RDW: 21.4 % — AB (ref 11.5–15.5)
WBC: 10 10*3/uL (ref 4.0–10.5)

## 2015-11-24 LAB — PREPARE RBC (CROSSMATCH)

## 2015-11-24 LAB — BASIC METABOLIC PANEL
ANION GAP: 7 (ref 5–15)
BUN: 8 mg/dL (ref 6–20)
CALCIUM: 7.8 mg/dL — AB (ref 8.9–10.3)
CO2: 19 mmol/L — ABNORMAL LOW (ref 22–32)
Chloride: 111 mmol/L (ref 101–111)
Creatinine, Ser: 0.74 mg/dL (ref 0.44–1.00)
GFR calc Af Amer: >60 mL/min (ref >60)
GLUCOSE: 112 mg/dL — AB (ref 65–99)
Potassium: 3.7 mmol/L (ref 3.5–5.1)
Sodium: 137 mmol/L (ref 135–145)

## 2015-11-24 LAB — PROTIME-INR
INR: 1.68 — ABNORMAL HIGH (ref 0.00–1.49)
Prothrombin Time: 19.8 seconds — ABNORMAL HIGH (ref 11.6–15.2)

## 2015-11-24 LAB — LIPID PANEL
CHOLESTEROL: 75 mg/dL (ref 0–200)
HDL: 25 mg/dL — ABNORMAL LOW (ref >40)
LDL Cholesterol: 39 mg/dL (ref 0–99)
Total CHOL/HDL Ratio: 3 RATIO
Triglycerides: 54 mg/dL (ref <150)
VLDL: 11 mg/dL (ref 0–40)

## 2015-11-24 LAB — PHOSPHORUS: Phosphorus: 2.3 mg/dL — ABNORMAL LOW (ref 2.5–4.6)

## 2015-11-24 LAB — MAGNESIUM: Magnesium: 1.8 mg/dL (ref 1.7–2.4)

## 2015-11-24 SURGERY — COLONOSCOPY
Anesthesia: Moderate Sedation

## 2015-11-24 MED ORDER — FENTANYL CITRATE (PF) 100 MCG/2ML IJ SOLN
INTRAMUSCULAR | Status: AC
  Filled 2015-11-24: qty 2

## 2015-11-24 MED ORDER — SODIUM CHLORIDE 0.9 % IV SOLN: INTRAVENOUS | Status: DC

## 2015-11-24 MED ORDER — MIDAZOLAM HCL 5 MG/ML IJ SOLN
INTRAMUSCULAR | Status: AC
  Filled 2015-11-24: qty 2

## 2015-11-24 MED ORDER — INSULIN ASPART 100 UNIT/ML ~~LOC~~ SOLN
0.0000 [IU] | Freq: Three times a day (TID) | SUBCUTANEOUS | Status: DC
  Administered 2015-11-28: 2 [IU] via SUBCUTANEOUS
  Administered 2015-11-28: 1 [IU] via SUBCUTANEOUS

## 2015-11-24 MED ORDER — FENTANYL CITRATE (PF) 100 MCG/2ML IJ SOLN
INTRAMUSCULAR | Status: DC | PRN
  Administered 2015-11-24: 25 ug via INTRAVENOUS

## 2015-11-24 MED ORDER — CETYLPYRIDINIUM CHLORIDE 0.05 % MT LIQD
7.0000 mL | Freq: Two times a day (BID) | OROMUCOSAL | Status: DC
  Administered 2015-11-24 – 2015-11-29 (×9): 7 mL via OROMUCOSAL

## 2015-11-24 MED ORDER — SODIUM CHLORIDE 0.9 % IV SOLN
Freq: Once | INTRAVENOUS | Status: AC
  Administered 2015-11-24: 05:00:00 via INTRAVENOUS

## 2015-11-24 MED ORDER — MIDAZOLAM HCL 5 MG/5ML IJ SOLN
INTRAMUSCULAR | Status: DC | PRN
  Administered 2015-11-24: 2 mg via INTRAVENOUS

## 2015-11-24 MED ORDER — DIPHENHYDRAMINE HCL 50 MG/ML IJ SOLN
INTRAMUSCULAR | Status: AC
  Filled 2015-11-24: qty 1

## 2015-11-24 NOTE — Progress Notes (Signed)
Patient tolerated colonoscopy satisfactorily, and to my surprise, it did not show any source of her anemia, specifically no polyps, masses, inflammation, or vascular ectasia.  In view of her need for ongoing anticoagulation, and to complete her GI workup, I would favor doing a small bowel video capsule endoscopy. Hopefully, this can be accomplished 2 days from now, when the equipment is available. I will plan to discuss the procedure with the patient tomorrow on rounds.  In the meantime, I do not see any endoscopic contraindication to resumption of anticoagulation, so aspirin and/or Coumadin can be restarted at any time that the other treating physicians feel it is appropriate, taking into account the probability of this patient needing surgery in the near future.  Cleotis Nipper, M.D. Pager [phone] If no answer or after 5 PM call [phone]

## 2015-11-24 NOTE — Progress Notes (Signed)
PCCM Interval Progress Note  Events:  Hgb 6.7.  Interventions:  2u PRBC ordered.   Montey Hora, [place] Pulmonary & Critical Care Medicine Pager: [phone]  or [phone] 11/24/2015, 4:09 AM

## 2015-11-24 NOTE — Interval H&P Note (Signed)
History and Physical Interval Note:  11/24/2015 9:17 AM  Jessica Abbott  has presented today for surgery, with the diagnosis of anemia  The various methods of treatment have been discussed with the patient. After consideration of risks, benefits and other options for treatment, the patient has consented to  Procedure(s): COLONOSCOPY (N/A) as a surgical intervention .  [name] history has been reviewed, patient examined, no change in status, stable for surgery.  I have reviewed the patient's chart and labs.  Questions were answered to the patient's satisfaction.     Cleotis Nipper

## 2015-11-24 NOTE — Progress Notes (Signed)
[place] TEAM 1 - Stepdown/ICU TEAM Progress Note  Jessica Abbott [id number] DOB: 07/05/1941 DOA: 11/22/2015 PCP: Nicola Girt, DO  Admit HPI / Brief Narrative: Jessica Abbott is a 75 year old female with PMHx Hypertension, DM without complication, chronic kidney disease, Peripheral vascular disease, arthritis,SOB. She is chronically in wheelchair and has not walked for several months.   She is unable to straighten out her R leg. Injured her foot in December and has been followed by vascular surgery and was scheduled to go to OR 3/14 for right femoral peroneal bypass grafting and transmetatarsal amputation. She has also been on coumadin due to this severe vascular disease and started having black stools about 2 weeks ago, she reported this at coumadin clinic and apparently the way she described her stool was not alarming. Over the past few days her weakness has significantly progressed, which caused her to present to ED at [facility]. In the ED on examination by the nurse it was noted that her right toes was black in color with foul odor and were unable to doppler her pedal or Tibial pulse. Upon arrival to ED her labs shows acute anaemia with Hb-3.3, WBC-12.1,platelets-614. X-Ray of foot also shows soft tissue gas and code sepsis was activated. Patient was given 1.5 liters fluid in the ER and 500 cc of blood.She was also started on Van/Zosyn. Patient is O neg and the blood bank only has 2 units of blood in the reserve and therefore patient is transferred on 3/7 to [facility].   HPI/Subjective: 3/9 A/O 4, slightly groggy (had EGD procedure this A.m.)  Assessment/Plan: GI bleed/Melena/Acute blood loss anemia -Unfortunately EGD/Colonoscopy nondiagnostic for source of bleeding -Patient has required 4 units PRBC/2 FFP since admission, will trend H/H -q 4hr H/H if patient continues to show signs of brisk bleeding will perform RBC scan. -Per GI unable to perform capsule study for another 48  hours. 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 -Antibiotics on hold; monitor temp, CBC very closely. -Echocardiogram pending - Per vascular surgery not urgent and can wait until she fully recovers from her GI bleed   Hypertension -Hold all BP medication  Acute kidney failure -BUN/Cr are WNL  DM Type 2? -Hemoglobin A1c pending -Lipid panel pending -Sensitive SSI   Code Status: FULL Family Communication: no family present at time of exam Disposition Plan: Resolution GI bleed/gas gangrene right foot   Consultants: Dr.Robert Buccini GI Dr Serafina Mitchell vascular surgery    Procedure/Significant Events: 3/7 right foot;Soft tissue swelling and soft tissue gas involving the distal great toe. Gas-forming infection cannot be excluded 3/8 transfused 1 unit PRBC 3/8 transfused 2 units FFP 3/8 EGD; 1 cm hiatal hernia 3/9 colonoscopy;Multiple medium-mouthed diverticula were found in the entire colon. 3/9 transfuse 3 units PRBC  Culture BCx2 3/7 >   Antibiotics: Zosyn 3/7 1 dose Vancomycin 3/71 dose    DVT prophylaxis: SCD   Devices    LINES / TUBES:      Continuous Infusions: . sodium chloride 50 mL/hr at 11/24/15 0600  . pantoprozole (PROTONIX) infusion 8 mg/hr (11/24/15 1826)    Objective: VITAL SIGNS: Temp: 98.5 F (36.9 C) (03/09 1553) Temp Source: Oral (03/09 1553) BP: 130/57 mmHg (03/09 1700) Pulse Rate: 78 (03/09 1700) SPO2; FIO2:   Intake/Output Summary (Last 24 hours) at 11/24/15 1856 Last data filed  at 11/24/15 1700  Gross per 24 hour  Intake 2510.5 ml  Output      0 ml  Net 2510.5 ml     Exam: General:A/O 4, slightly groggy , No acute respiratory distress Eyes: Negative headache, negative scleral hemorrhage ENT: Negative Runny nose, negative gingival  bleeding, Neck:  Negative scars, masses, torticollis, lymphadenopathy, JVD Lungs: Clear to auscultation bilaterally without wheezes or crackles Cardiovascular: Regular rate and rhythm without murmur gallop or rub normal S1 and S2 Abdomen:negative abdominal pain, nondistended, positive soft, bowel sounds, no rebound, no ascites, no appreciable mass Extremities:  right lower foot black foul-smelling no pulse palpable, left lower extremity warm barely palpable DP/PT pulse  Psychiatric:  Negative depression, negative anxiety, negative fatigue, negative mania  Neurologic:  Cranial nerves II through XII intact, tongue/uvula midline, all extremities muscle strength 5/5, sensation intact throughout,negative dysarthria, negative expressive aphasia, negative receptive aphasia.   Data Reviewed: Basic Metabolic Panel:  Recent Labs Lab 11/22/15 1355 11/23/15 0451 11/24/15 0323  NA 136 139 137  K 4.2 4.2 3.7  CL 107 114* 111  CO2 18* 18* 19*  GLUCOSE 177* 125* 112*  BUN 23* 13 8  CREATININE 0.89 0.71 0.74  CALCIUM 8.2* 8.1* 7.8*  MG  --   --  1.8  PHOS  --   --  2.3*   Liver Function Tests:  Recent Labs Lab 11/22/15 1355  AST 24  ALT 11*  ALKPHOS 47  BILITOT 0.5  PROT 7.1  ALBUMIN 2.7*   No results for input(s): LIPASE, AMYLASE in the last 168 hours. No results for input(s): AMMONIA in the last 168 hours. CBC:  Recent Labs Lab 11/22/15 1355 11/22/15 1445 11/23/15 0451 11/24/15 0323  WBC 12.2* 12.1* 9.7 10.0  NEUTROABS 9.1*  --   --   --   HGB 3.5* 3.3* 7.7* 6.7*  HCT 12.5* 11.9* 24.5* 20.9*  MCV 67.6* 67.2* 74.5* 74.9*  PLT 610* 614* 423* 360   Cardiac Enzymes: No results for input(s): CKTOTAL, CKMB, CKMBINDEX, TROPONINI in the last 168 hours. BNP (last 3 results) No results for input(s): BNP in the last 8760 hours.  ProBNP (last 3 results) No results for input(s): PROBNP in the last 8760 hours.  CBG:  Recent Labs Lab 11/24/15 11/24/15 0357 11/24/15 0818  11/24/15 1150 11/24/15 1552  GLUCAP 125* 131* 115* 96 139*    Recent Results (from the past 240 hour(s))  Culture, blood (single)     Status: None (Preliminary result)   Collection Time: 11/22/15  1:55 PM  Result Value Ref Range Status   Specimen Description BLOOD RIGHT ARM  Final   Special Requests BOTTLES DRAWN AEROBIC AND ANAEROBIC 5CC EACH  Final   Culture   Final    NO GROWTH 2 DAYS Performed at [facility]    Report Status PENDING  Incomplete  MRSA PCR Screening     Status: None   Collection Time: 11/22/15  5:01 PM  Result Value Ref Range Status   MRSA by PCR NEGATIVE NEGATIVE Final    Comment:        The GeneXpert MRSA Assay (FDA approved for NASAL specimens only), is one component of a comprehensive MRSA colonization surveillance program. It is not intended to diagnose MRSA infection nor to guide or monitor treatment for MRSA infections.      Studies:  Recent x-ray studies have been reviewed in detail by the Attending Physician  Scheduled Meds:  Scheduled Meds: . baclofen  5 mg Oral  BID  . insulin aspart  0-9 Units Subcutaneous 6 times per day  . magnesium hydroxide  30 mL Oral Once  . pantoprazole (PROTONIX) IV  80 mg Intravenous Once  . [START ON 11/26/2015] pantoprazole (PROTONIX) IV  40 mg Intravenous Q12H  . sodium chloride flush  3 mL Intravenous Q12H    Time spent on care of this patient: 40 mins   Olufemi Mofield, Geraldo Docker , MD  [facility] Office  [phone] Pager - [phone]  On-Call/Text Page:      [email or web address]      password [email or web address]  If 7PM-7AM, please contact night-coverage [email or web address] Password [email or web address] 11/24/2015, 6:56 PM   LOS: 2 days   Care during the described time interval was provided by me .  I have reviewed this patient's available data, including medical history, events of note, physical examination, and all test results as part of my evaluation. I have personally reviewed and interpreted all radiology  studies.   Dia Crawford, MD [phone] Pager

## 2015-11-24 NOTE — Progress Notes (Signed)
CRITICAL VALUE ALERT  Critical value received:  Hg 6.7   Date of notification:  11/24/15  Time of notification:  405   Critical value read back:Yes.    Nurse who received alert:  [email or web address]  MD notified (1st page):  Rahul Dianna Rossetti, PA-C   Time of first page:  64  MD notified (2nd page):  Time of second page:  Responding MD:  Germain Osgood, PA-C  Time MD responded:  410

## 2015-11-24 NOTE — Op Note (Signed)
[facility] Patient Name: Jessica Abbott Procedure Date : 11/24/2015 MRN: [id number] Attending MD: Ronald Lobo , MD Date of Birth: 05/03/1941 CSN: [id number] Age: 75 Admit Type: Inpatient Procedure:                Colonoscopy Indications:              Heme positive stool, Iron deficiency anemia                            (presented w/ hgb 3.3 while on ASA and coumadin,                            egd yesterday negative) Providers:                Ronald Lobo, MD, Tory Emerald, RN, William Dalton, Technician Referring MD:              Medicines:                Fentanyl 25 micrograms IV, Midazolam 2 mg IV Complications:            No immediate complications. Estimated Blood Loss:     Estimated blood loss: none. Procedure:                Pre-Anesthesia Assessment:                           - Prior to the procedure, a History and Physical                            was performed, and patient medications and                            allergies were reviewed. [name] tolerance of                            previous anesthesia was also reviewed. The risks                            and benefits of the procedure and the sedation                            options and risks were discussed with the patient.                            All questions were answered, and informed consent                            was obtained. Prior Anticoagulants: The patient                            last took aspirin 3 days and Coumadin (warfarin) 3  days prior to the procedure. ASA Grade Assessment:                            IV - A patient with severe systemic disease that is                            a constant threat to life. After reviewing the                            risks and benefits, the patient was deemed in                            satisfactory condition to undergo the procedure.                           After  obtaining informed consent, the colonoscope                            was passed under direct vision. Throughout the                            procedure, the patient's blood pressure, pulse, and                            oxygen saturations were monitored continuously. The                            EC-[id number] scope was introduced through                            the anus and advanced to the the cecum, identified                            by appendiceal orifice and ileocecal valve. The                            colonoscopy was technically difficult and complex                            due to multiple diverticula in the colon and                            significant looping. The patient tolerated the                            procedure fairly well. The quality of the bowel                            preparation was adequate. The ileocecal valve, the                            appendiceal orifice and the rectum were  photographed. Scope In: 10:03:06 AM Scope Out: 10:23:53 AM Scope Withdrawal Time: 0 hours 5 minutes 14 seconds  Total Procedure Duration: 0 hours 20 minutes 47 seconds  Findings:      Multiple medium-mouthed diverticula were found in the entire colon.      The exam was otherwise normal throughout the examined colon.      Retroflexion was not performed in the rectum due to a small rectal       ampulla, but antegrade viewing disclosed no significant abnormalities. Impression:               - Diverticulosis in the entire examined colon.                           - No specimens collected.                           - No source of profound anemia seen on this exam. Moderate Sedation:      Moderate (conscious) sedation was administered by the endoscopy nurse       and supervised by the endoscopist. The following parameters were       monitored: oxygen saturation, heart rate, blood pressure, and response       to care. Total physician  intraservice time was 30 minutes. Recommendation:           - To visualize the small bowel, perform video                            capsule endoscopy at appointment to be scheduled.                           - Continue present medications.                           - OK to resume aspirin and Coumadin (warfarin) at                            prior doses whenever attending physician feels it                            is appropriate. (OK from GI standpoint to resume                            anticoagulation before capsule endoscopy is                            performed; however, patient may need to hold                            anticoagulation because of anticipated surgery.)                           - Cardiac diet today.                           - No repeat colonoscopy due to age. Procedure Code(s):        --- Professional ---  [phone], Colonoscopy, flexible; diagnostic, including                            collection of specimen(s) by brushing or washing,                            when performed (separate procedure)                           99152, Moderate sedation services provided by the                            same physician or other qualified health care                            professional performing the diagnostic or                            therapeutic service that the sedation supports,                            requiring the presence of an independent trained                            observer to assist in the monitoring of the                            patient's level of consciousness and physiological                            status; initial 15 minutes of intraservice time,                            patient age 49 years or older                           [phone], Moderate sedation services; each additional                            15 minutes intraservice time Diagnosis Code(s):        --- Professional ---                            R19.5, Other fecal abnormalities                           D50.9, Iron deficiency anemia, unspecified                           K57.30, Diverticulosis of large intestine without                            perforation or abscess without bleeding CPT copyright 2016 American Medical Association. All rights reserved. The codes documented in this report are preliminary and upon coder review may  be revised to meet current  compliance requirements. Ronald Lobo, MD  Ronald Lobo, MD 11/24/2015 10:46:23 AM This report has been signed electronically. Number of Addenda: 0

## 2015-11-25 ENCOUNTER — Encounter (HOSPITAL_COMMUNITY): Payer: Medicare Other

## 2015-11-25 ENCOUNTER — Ambulatory Visit (HOSPITAL_COMMUNITY): Payer: Medicare Other

## 2015-11-25 ENCOUNTER — Inpatient Hospital Stay (HOSPITAL_COMMUNITY): Payer: Medicare Other

## 2015-11-25 ENCOUNTER — Inpatient Hospital Stay (HOSPITAL_COMMUNITY): Admission: RE | Admit: 2015-11-25 | Payer: Medicare Other | Source: Ambulatory Visit

## 2015-11-25 DIAGNOSIS — I349 Nonrheumatic mitral valve disorder, unspecified: Secondary | ICD-10-CM

## 2015-11-25 DIAGNOSIS — I739 Peripheral vascular disease, unspecified: Secondary | ICD-10-CM | POA: Diagnosis present

## 2015-11-25 DIAGNOSIS — S36209A Unspecified injury of unspecified part of pancreas, initial encounter: Secondary | ICD-10-CM

## 2015-11-25 DIAGNOSIS — E1169 Type 2 diabetes mellitus with other specified complication: Secondary | ICD-10-CM

## 2015-11-25 DIAGNOSIS — E139 Other specified diabetes mellitus without complications: Secondary | ICD-10-CM | POA: Diagnosis present

## 2015-11-25 LAB — TYPE AND SCREEN
ABO/RH(D): A POS
Antibody Screen: NEGATIVE
UNIT DIVISION: 0
Unit division: 0
Unit division: 0
Unit division: 0

## 2015-11-25 LAB — GLUCOSE, CAPILLARY
GLUCOSE-CAPILLARY: 128 mg/dL — AB (ref 65–99)
GLUCOSE-CAPILLARY: 328 mg/dL — AB (ref 65–99)
GLUCOSE-CAPILLARY: 456 mg/dL — AB (ref 65–99)
Glucose-Capillary: 105 mg/dL — ABNORMAL HIGH (ref 65–99)
Glucose-Capillary: 112 mg/dL — ABNORMAL HIGH (ref 65–99)
Glucose-Capillary: 182 mg/dL — ABNORMAL HIGH (ref 65–99)
Glucose-Capillary: 106 mg/dL — ABNORMAL HIGH (ref 65–99)
Glucose-Capillary: 114 mg/dL — ABNORMAL HIGH (ref 65–99)

## 2015-11-25 LAB — CBC WITH DIFFERENTIAL/PLATELET
BASOS ABS: 0 10*3/uL (ref 0.0–0.1)
BASOS PCT: 0 %
EOS ABS: 0.2 10*3/uL (ref 0.0–0.7)
Eosinophils Relative: 2 %
HCT: 27.8 % — ABNORMAL LOW (ref 36.0–46.0)
HEMOGLOBIN: 9.1 g/dL — AB (ref 12.0–15.0)
LYMPHS PCT: 11 %
Lymphs Abs: 1.1 10*3/uL (ref 0.7–4.0)
MCH: 24.9 pg — AB (ref 26.0–34.0)
MCHC: 32.7 g/dL (ref 30.0–36.0)
MCV: 76.2 fL — ABNORMAL LOW (ref 78.0–100.0)
Monocytes Absolute: 0.5 10*3/uL (ref 0.1–1.0)
Monocytes Relative: 5 %
NEUTROS PCT: 82 %
Neutro Abs: 8.5 10*3/uL — ABNORMAL HIGH (ref 1.7–7.7)
Platelets: 358 10*3/uL (ref 150–400)
RBC: 3.65 MIL/uL — ABNORMAL LOW (ref 3.87–5.11)
RDW: 21.2 % — ABNORMAL HIGH (ref 11.5–15.5)
WBC: 10.3 10*3/uL (ref 4.0–10.5)

## 2015-11-25 LAB — COMPREHENSIVE METABOLIC PANEL
ALK PHOS: 57 U/L (ref 38–126)
ALT: 15 U/L (ref 14–54)
ANION GAP: 8 (ref 5–15)
AST: 25 U/L (ref 15–41)
Albumin: 2.3 g/dL — ABNORMAL LOW (ref 3.5–5.0)
BILIRUBIN TOTAL: 0.9 mg/dL (ref 0.3–1.2)
BUN: 5 mg/dL — ABNORMAL LOW (ref 6–20)
CALCIUM: 8.1 mg/dL — AB (ref 8.9–10.3)
CO2: 20 mmol/L — ABNORMAL LOW (ref 22–32)
Chloride: 109 mmol/L (ref 101–111)
Creatinine, Ser: 0.63 mg/dL (ref 0.44–1.00)
Glucose, Bld: 111 mg/dL — ABNORMAL HIGH (ref 65–99)
Potassium: 3.8 mmol/L (ref 3.5–5.1)
Sodium: 137 mmol/L (ref 135–145)
TOTAL PROTEIN: 6.2 g/dL — AB (ref 6.5–8.1)

## 2015-11-25 LAB — ECHOCARDIOGRAM COMPLETE
Height: 62 in
Weight: 2437.41 oz

## 2015-11-25 LAB — HEMOGLOBIN AND HEMATOCRIT, BLOOD
HCT: 28.9 % — ABNORMAL LOW (ref 36.0–46.0)
HEMATOCRIT: 29.5 % — AB (ref 36.0–46.0)
HEMOGLOBIN: 9.2 g/dL — AB (ref 12.0–15.0)
Hemoglobin: 9.2 g/dL — ABNORMAL LOW (ref 12.0–15.0)

## 2015-11-25 LAB — LACTIC ACID, PLASMA: Lactic Acid, Venous: 1.2 mmol/L (ref 0.5–2.0)

## 2015-11-25 LAB — PROTIME-INR
INR: 1.56 — AB (ref 0.00–1.49)
PROTHROMBIN TIME: 18.7 s — AB (ref 11.6–15.2)

## 2015-11-25 LAB — MAGNESIUM: Magnesium: 1.9 mg/dL (ref 1.7–2.4)

## 2015-11-25 MED ORDER — MORPHINE SULFATE (PF) 4 MG/ML IV SOLN
INTRAVENOUS | Status: AC
  Administered 2015-11-25: 2 mg
  Filled 2015-11-25: qty 1

## 2015-11-25 MED ORDER — MORPHINE SULFATE (PF) 4 MG/ML IV SOLN
INTRAVENOUS | Status: AC
  Administered 2015-11-25: 2 mg via INTRAVENOUS
  Filled 2015-11-25: qty 1

## 2015-11-25 MED ORDER — TECHNETIUM TC 99M-LABELED RED BLOOD CELLS IV KIT
25.0000 | PACK | Freq: Once | INTRAVENOUS | Status: AC | PRN
  Administered 2015-11-25: 25 via INTRAVENOUS

## 2015-11-25 NOTE — Care Management Important Message (Signed)
Important Message  Patient Details  Name: Jessica Abbott MRN: [id number] Date of Birth: April 07, 1941   Medicare Important Message Given:  Yes    Nathen May 11/25/2015, 11:54 AM

## 2015-11-25 NOTE — Progress Notes (Signed)
Patient off unit for procedure.

## 2015-11-25 NOTE — Progress Notes (Signed)
TEAM 1 - Stepdown/ICU TEAM Progress Note  Jessica Abbott [id number] DOB: Sep 18, 1940 DOA: 11/22/2015 PCP: Nicola Girt, DO  Admit HPI / Brief Narrative: Jessica Abbott is a 75 year old female with PMHx Hypertension, DM without complication, chronic kidney disease, Peripheral vascular disease, arthritis,SOB. She is chronically in wheelchair and has not walked for several months.   She is unable to straighten out her R leg. Injured her foot in December and has been followed by vascular surgery and was scheduled to go to OR 3/14 for right femoral peroneal bypass grafting and transmetatarsal amputation. She has also been on coumadin due to this severe vascular disease and started having black stools about 2 weeks ago, she reported this at coumadin clinic and apparently the way she described her stool was not alarming. Over the past few days her weakness has significantly progressed, which caused her to present to ED at [facility]. In the ED on examination by the nurse it was noted that her right toes was black in color with foul odor and were unable to doppler her pedal or Tibial pulse. Upon arrival to ED her labs shows acute anaemia with Hb-3.3, WBC-12.1,platelets-614. X-Ray of foot also shows soft tissue gas and code sepsis was activated. Patient was given 1.5 liters fluid in the ER and 500 cc of blood.She was also started on Van/Zosyn. Patient is O neg and the blood bank only has 2 units of blood in the reserve and therefore patient is transferred on 3/7 to [facility].   HPI/Subjective: 3/10 AO 4, NAD   Assessment/Plan: GI bleed/Melena/Acute blood loss anemia -Unfortunately EGD/Colonoscopy nondiagnostic for source of bleeding -Patient has required 4 units PRBC/2 FFP since admission, will trend H/H -q 4hr H/H shows continued bleeding;. -Per GI unable to perform capsule study for another 48 hours. 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 -Antibiotics on hold; monitor temp, CBC very closely. -Echocardiogram pending - Per vascular surgery not urgent and can wait until she fully recovers from her GI bleed   Hypertension -Hold all BP medication  Acute kidney failure -BUN/Cr are WNL  DM Type 2? -Hemoglobin A1c pending -Lipid panel; W/i ADA guidelines  -Sensitive SSI   Code Status: FULL Family Communication: no family present at time of exam Disposition Plan: Resolution GI bleed/gas gangrene right foot   Consultants: Dr.Robert Buccini GI Dr Serafina Mitchell vascular surgery    Procedure/Significant Events: 3/7 right foot;Soft tissue swelling and soft tissue gas involving the distal great toe. Gas-forming infection cannot be excluded 3/8 transfused 1 unit PRBC 3/8 transfused 2 units FFP 3/8 EGD; 1 cm hiatal hernia 3/9 colonoscopy;Multiple medium-mouthed diverticula were found in the entire colon. 3/9 transfuse 3 units PRBC  Culture 3/7 Blood Rt Arm NGTD  Antibiotics: Zosyn 3/7 1 dose Vancomycin 3/71 dose    DVT prophylaxis: SCD   Devices    LINES / TUBES:      Continuous Infusions: . sodium chloride 50 mL/hr at 11/25/15 0451  . pantoprozole (PROTONIX) infusion 8 mg/hr (11/25/15 0451)    Objective: VITAL SIGNS: Temp: 98.1 F (36.7 C) (03/10 0722) Temp Source: Oral (03/10 0722) BP: 143/58 mmHg (03/10 0900) Pulse Rate: 74 (03/10 0900) SPO2; FIO2:   Intake/Output Summary (Last 24 hours) at 11/25/15 1056 Last data  filed at 11/25/15 0900  Gross per 24 hour  Intake   2085 ml  Output      0 ml  Net   2085 ml     Exam: General:A/O 4, No acute respiratory distress Eyes: Negative headache, negative scleral hemorrhage ENT: Negative Runny nose, negative gingival  bleeding, Neck:  Negative scars, masses, torticollis, lymphadenopathy, JVD Lungs: Clear to auscultation bilaterally without wheezes or crackles Cardiovascular: Regular rate and rhythm without murmur gallop or rub normal S1 and S2 Abdomen:negative abdominal pain, nondistended, positive soft, bowel sounds, no rebound, no ascites, no appreciable mass Extremities:  right lower foot black foul-smelling no pulse palpable, left lower extremity warm barely palpable DP/PT pulse  Psychiatric:  Negative depression, negative anxiety, negative fatigue, negative mania  Neurologic:  Cranial nerves II through XII intact, tongue/uvula midline, all extremities muscle strength 5/5, sensation intact throughout,negative dysarthria, negative expressive aphasia, negative receptive aphasia.   Data Reviewed: Basic Metabolic Panel:  Recent Labs Lab 11/22/15 1355 11/23/15 0451 11/24/15 0323  NA 136 139 137  K 4.2 4.2 3.7  CL 107 114* 111  CO2 18* 18* 19*  GLUCOSE 177* 125* 112*  BUN 23* 13 8  CREATININE 0.89 0.71 0.74  CALCIUM 8.2* 8.1* 7.8*  MG  --   --  1.8  PHOS  --   --  2.3*   Liver Function Tests:  Recent Labs Lab 11/22/15 1355  AST 24  ALT 11*  ALKPHOS 47  BILITOT 0.5  PROT 7.1  ALBUMIN 2.7*   No results for input(s): LIPASE, AMYLASE in the last 168 hours. No results for input(s): AMMONIA in the last 168 hours. CBC:  Recent Labs Lab 11/22/15 1355 11/22/15 1445 11/23/15 0451 11/24/15 0323 11/24/15 1840 11/24/15 2200 11/25/15 0241 11/25/15 0602 11/25/15 1017  WBC 12.2* 12.1* 9.7 10.0  --   --   --  10.3  --   NEUTROABS 9.1*  --   --   --   --   --   --  8.5*  --   HGB 3.5* 3.3* 7.7* 6.7* 9.9* 9.7* 9.2* 9.1* 9.2*  HCT 12.5* 11.9* 24.5* 20.9* 31.1* 29.4* 28.9* 27.8* 29.5*  MCV 67.6* 67.2* 74.5* 74.9*  --   --   --  76.2*  --   PLT 610* 614* 423* 360  --   --   --  358  --    Cardiac Enzymes: No results for input(s): CKTOTAL, CKMB, CKMBINDEX, TROPONINI in the last 168  hours. BNP (last 3 results) No results for input(s): BNP in the last 8760 hours.  ProBNP (last 3 results) No results for input(s): PROBNP in the last 8760 hours.  CBG:  Recent Labs Lab 11/24/15 0818 11/24/15 1150 11/24/15 1552 11/24/15 1959 11/25/15 0724  GLUCAP 115* 96 139* 128* 112*    Recent Results (from the past 240 hour(s))  Culture, blood (single)     Status: None (Preliminary result)   Collection Time: 11/22/15  1:55 PM  Result Value Ref Range Status   Specimen Description BLOOD RIGHT ARM  Final   Special Requests BOTTLES DRAWN AEROBIC AND ANAEROBIC 5CC EACH  Final   Culture   Final    NO GROWTH 2 DAYS Performed at [facility]    Report Status PENDING  Incomplete  MRSA PCR Screening     Status: None   Collection Time: 11/22/15  5:01 PM  Result Value Ref Range Status   MRSA by PCR NEGATIVE NEGATIVE Final    Comment:  The GeneXpert MRSA Assay (FDA approved for NASAL specimens only), is one component of a comprehensive MRSA colonization surveillance program. It is not intended to diagnose MRSA infection nor to guide or monitor treatment for MRSA infections.      Studies:  Recent x-ray studies have been reviewed in detail by the Attending Physician  Scheduled Meds:  Scheduled Meds: . antiseptic oral rinse  7 mL Mouth Rinse BID  . baclofen  5 mg Oral BID  . insulin aspart  0-9 Units Subcutaneous TID WC  . magnesium hydroxide  30 mL Oral Once  . [START ON 11/26/2015] pantoprazole (PROTONIX) IV  40 mg Intravenous Q12H  . sodium chloride flush  3 mL Intravenous Q12H    Time spent on care of this patient: 40 mins   Tripp Goins, Geraldo Docker , MD  [facility] Office  [phone] Pager - [phone]  On-Call/Text Page:      [email or web address]      password [email or web address]  If 7PM-7AM, please contact night-coverage [email or web address] Password [email or web address] 11/25/2015, 10:56 AM   LOS: 3 days   Care during the described time interval was provided by me .  I have  reviewed this patient's available data, including medical history, events of note, physical examination, and all test results as part of my evaluation. I have personally reviewed and interpreted all radiology studies.   Dia Crawford, MD [phone] Pager

## 2015-11-25 NOTE — Care Management Note (Signed)
Case Management Note  Patient Details  Name: Jessica Abbott MRN: [id number] Date of Birth: 05/03/1941  Subjective/Objective:                    Action/Plan:   Pt is from home; family provides 48 hour care.  Per daughter [name]; family is prepared to resume 24 hour care once discharge, pt also has walker at home.  Pt is wheelchair bound since December due to leg wound.  CM requested PT /OT eval.  CM will continue to monitor for disposition needs.     Expected Discharge Date:                  Expected Discharge Plan:  [place]  In-House Referral:     Discharge planning Services  CM Consult  Post Acute Care Choice:    Choice offered to:     DME Arranged:    DME Agency:     HH Arranged:    [place] Agency:     Status of Service:  In process, will continue to follow  Medicare Important Message Given:  Yes Date Medicare IM Given:    Medicare IM give by:    Date Additional Medicare IM Given:    Additional Medicare Important Message give by:     If discussed at [place] of Stay Meetings, dates discussed:    Additional Comments:  Maryclare Labrador, RN 11/25/2015, 3:45 PM

## 2015-11-25 NOTE — Progress Notes (Signed)
  Echocardiogram 2D Echocardiogram has been performed.  Jessica Abbott 11/25/2015, 4:57 PM

## 2015-11-25 NOTE — Progress Notes (Signed)
Report was given to 6E09. Pt VS stable, pt denies pain.

## 2015-11-25 NOTE — Progress Notes (Signed)
Patient's hemoglobin has been essentially stable over the past several days.  To complete her GI workup, considering the profound anemia with which she presented, plus or likely ongoing need for anticoagulation, I think it would be prudent to do a capsule endoscopy while she is an inpatient.  Petra Kuba, purpose, risks of capsule endoscopy discussed with patient and she is agreeable. It will be performed tomorrow and hopefully read either Sunday or Monday.  We will follow at a distance. Please call us at any time if more immediate input is needed.  Cleotis Nipper, M.D. Pager [phone] If no answer or after 5 PM call [phone]

## 2015-11-25 NOTE — Progress Notes (Signed)
Pt moaning, crying grimacing in pain. Pt states "my back and legs are killing me. I cannot do this." Medicated pt, see MAR

## 2015-11-26 ENCOUNTER — Encounter (HOSPITAL_COMMUNITY): Payer: Self-pay | Admitting: *Deleted

## 2015-11-26 ENCOUNTER — Encounter (HOSPITAL_COMMUNITY)
Admission: EM | Disposition: A | Discharge status: Home / Self Care | Payer: Self-pay | Source: Home / Self Care | Attending: Internal Medicine

## 2015-11-26 HISTORY — PX: GIVENS CAPSULE STUDY: SHX5432

## 2015-11-26 LAB — HEMOGLOBIN A1C
Hgb A1c MFr Bld: 5.9 % — ABNORMAL HIGH (ref 4.8–5.6)
MEAN PLASMA GLUCOSE: 123 mg/dL

## 2015-11-26 LAB — CBC
HEMATOCRIT: 28.9 % — AB (ref 36.0–46.0)
HEMOGLOBIN: 9.1 g/dL — AB (ref 12.0–15.0)
MCH: 24.1 pg — ABNORMAL LOW (ref 26.0–34.0)
MCHC: 31.5 g/dL (ref 30.0–36.0)
MCV: 76.7 fL — ABNORMAL LOW (ref 78.0–100.0)
Platelets: 369 10*3/uL (ref 150–400)
RBC: 3.77 MIL/uL — ABNORMAL LOW (ref 3.87–5.11)
RDW: 22.1 % — ABNORMAL HIGH (ref 11.5–15.5)
WBC: 10.9 10*3/uL — ABNORMAL HIGH (ref 4.0–10.5)

## 2015-11-26 LAB — COMPREHENSIVE METABOLIC PANEL
ALBUMIN: 2.4 g/dL — AB (ref 3.5–5.0)
ALT: 15 U/L (ref 14–54)
AST: 22 U/L (ref 15–41)
Alkaline Phosphatase: 54 U/L (ref 38–126)
Anion gap: 11 (ref 5–15)
BUN: 6 mg/dL (ref 6–20)
CHLORIDE: 109 mmol/L (ref 101–111)
CO2: 17 mmol/L — ABNORMAL LOW (ref 22–32)
Calcium: 8.5 mg/dL — ABNORMAL LOW (ref 8.9–10.3)
Creatinine, Ser: 0.63 mg/dL (ref 0.44–1.00)
GFR calc Af Amer: >60 mL/min (ref >60)
GFR calc non Af Amer: >60 mL/min (ref >60)
GLUCOSE: 83 mg/dL (ref 65–99)
POTASSIUM: 3.8 mmol/L (ref 3.5–5.1)
SODIUM: 137 mmol/L (ref 135–145)
Total Bilirubin: 1.1 mg/dL (ref 0.3–1.2)
Total Protein: 6.6 g/dL (ref 6.5–8.1)

## 2015-11-26 LAB — GLUCOSE, CAPILLARY
GLUCOSE-CAPILLARY: 98 mg/dL (ref 65–99)
Glucose-Capillary: 93 mg/dL (ref 65–99)
Glucose-Capillary: 115 mg/dL — ABNORMAL HIGH (ref 65–99)
Glucose-Capillary: 126 mg/dL — ABNORMAL HIGH (ref 65–99)

## 2015-11-26 LAB — MAGNESIUM: MAGNESIUM: 1.8 mg/dL (ref 1.7–2.4)

## 2015-11-26 SURGERY — IMAGING PROCEDURE, GI TRACT, INTRALUMINAL, VIA CAPSULE
Anesthesia: LOCAL

## 2015-11-26 MED ORDER — SODIUM CHLORIDE 0.9 % IV SOLN: INTRAVENOUS | Status: DC

## 2015-11-26 SURGICAL SUPPLY — 1 items: TOWEL COTTON PACK 4EA (MISCELLANEOUS) ×4 IMPLANT

## 2015-11-26 NOTE — Progress Notes (Signed)
[place] TEAM 1 - Stepdown/ICU TEAM Progress Note  Jessica Abbott [id number] DOB: [birth year] DOA: 11/22/2015 PCP: Nicola Girt, DO  Admit HPI / Brief Narrative: Jessica Abbott is a 75 year old female with PMHx Hypertension, DM without complication, chronic kidney disease, Peripheral vascular disease, arthritis,SOB. She is chronically in wheelchair and has not walked for several months.   She is unable to straighten out her R leg. Injured her foot in December and has been followed by vascular surgery and was scheduled to go to OR 3/14 for right femoral peroneal bypass grafting and transmetatarsal amputation. She has also been on coumadin due to this severe vascular disease and started having black stools about 2 weeks ago, she reported this at coumadin clinic and apparently the way she described her stool was not alarming. Over the past few days her weakness has significantly progressed, which caused her to present to ED at [facility]. In the ED on examination by the nurse it was noted that her right toes was black in color with foul odor and were unable to doppler her pedal or Tibial pulse. Upon arrival to ED her labs shows acute anaemia with Hb-3.3, WBC-12.1,platelets-614. X-Ray of foot also shows soft tissue gas and code sepsis was activated. Patient was given 1.5 liters fluid in the ER and 500 cc of blood.She was also started on Van/Zosyn. Patient is O neg and the blood bank only has 2 units of blood in the reserve and therefore patient is transferred on 3/7 to [facility].   HPI/Subjective: 3/11 AO 4, NAD. Swallowed capsule today   Assessment/Plan: GI bleed/Melena/Acute blood loss anemia -Unfortunately EGD/Colonoscopy nondiagnostic for source of bleeding -Patient has required 4 units PRBC/2 FFP since admission, will trend H/H -q 4hr H/H shows continued bleeding;. -Per GI unable to perform capsule study for another 48 hours. 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 -Antibiotics on hold; monitor temp, CBC very closely. -Echocardiogram pending - Per vascular surgery not urgent and can wait until she fully recovers from her GI bleed   Hypertension -Hold all BP medication  Severe pulmonary HTN /moderate AV & TV regurgitation  -See hypertension  Acute kidney failure -BUN/Cr are WNL  DM Type 2 controlled with complications -3/9 Hemoglobin A1c= 5.9 -Lipid panel; W/i ADA guidelines  -Sensitive SSI   Code Status: FULL Family Communication: no family present at time of exam Disposition Plan: Resolution GI bleed/gas gangrene right foot   Consultants: Dr.Robert Buccini GI Dr Serafina Mitchell vascular surgery    Procedure/Significant Events: 3/7 right foot;Soft tissue swelling and soft tissue gas involving the distal great toe. Gas-forming infection cannot be excluded 3/8 transfused 1 unit PRBC 3/8 transfused 2 units FFP 3/8 EGD; 1 cm hiatal hernia 3/9 colonoscopy;Multiple medium-mouthed diverticula were found in the entire colon. 3/9 transfuse 3 units PRBC 3/10 echocardiogram;LVEF= 60%-65%. -(grade 1 diastolic dysfunction).  - Aortic valve: moderate regurgitation. -- Tricuspid valve:  moderate regurgitation. - Pulmonary arteries:  PA peak pressure: 66 mm      Culture 3/7 Blood Rt Arm NGTD  Antibiotics: Zosyn 3/7 1 dose Vancomycin 3/71 dose    DVT prophylaxis: SCD   Devices    LINES / TUBES:      Continuous Infusions: . sodium chloride  50 mL/hr at 11/25/15 0451    Objective: VITAL SIGNS: Temp: 99.3 F (37.4 C) (03/11 1653) Temp Source: Oral (03/11 1653) BP: 164/57 mmHg (03/11 1653) Pulse Rate: 95 (03/11  1653) SPO2; FIO2:   Intake/Output Summary (Last 24 hours) at 11/26/15 1900 Last data filed at 11/26/15 0900  Gross per 24 hour  Intake     50 ml  Output      0 ml  Net     50 ml     Exam: General:A/O 4, No acute respiratory distress Eyes: Negative headache, negative scleral hemorrhage ENT: Negative Runny nose, negative gingival bleeding, Neck:  Negative scars, masses, torticollis, lymphadenopathy, JVD Lungs: Clear to auscultation bilaterally without wheezes or crackles Cardiovascular: Regular rate and rhythm without murmur gallop or rub normal S1 and S2 Abdomen:negative abdominal pain, nondistended, positive soft, bowel sounds, no rebound, no ascites, no appreciable mass Extremities:  right lower foot black foul-smelling no pulse palpable, left lower extremity warm barely palpable DP/PT pulse  Psychiatric:  Negative depression, negative anxiety, negative fatigue, negative mania  Neurologic:  Cranial nerves II through XII intact, tongue/uvula midline, all extremities muscle strength 5/5, sensation intact throughout,negative dysarthria, negative expressive aphasia, negative receptive aphasia.   Data Reviewed: Basic Metabolic Panel:  Recent Labs Lab 11/22/15 1355 11/23/15 0451 11/24/15 0323 11/25/15 1836 11/26/15 0613  NA 136 139 137 137 137  K 4.2 4.2 3.7 3.8 3.8  CL 107 114* 111 109 109  CO2 18* 18* 19* 20* 17*  GLUCOSE 177* 125* 112* 111* 83  BUN 23* 13 8 5* 6  CREATININE 0.89 0.71 0.74 0.63 0.63  CALCIUM 8.2* 8.1* 7.8* 8.1* 8.5*  MG  --   --  1.8 1.9 1.8  PHOS  --   --  2.3*  --   --    Liver Function Tests:  Recent Labs Lab 11/22/15 1355 11/25/15 1836 11/26/15 0613  AST 24 25 22   ALT 11* 15 15  ALKPHOS 47 57 54  BILITOT 0.5 0.9 1.1  PROT 7.1 6.2* 6.6  ALBUMIN 2.7* 2.3* 2.4*   No results for input(s): LIPASE, AMYLASE in the last 168 hours. No results for input(s): AMMONIA in the last 168 hours. CBC:  Recent Labs Lab 11/22/15 1355 11/22/15 1445  11/23/15 0451 11/24/15 0323  11/24/15 2200 11/25/15 0241 11/25/15 0602 11/25/15 1017 11/26/15 0613  WBC 12.2* 12.1* 9.7 10.0  --   --   --  10.3  --  10.9*  NEUTROABS 9.1*  --   --   --   --   --   --  8.5*  --   --   HGB 3.5* 3.3* 7.7* 6.7*  < > 9.7* 9.2* 9.1* 9.2* 9.1*  HCT 12.5* 11.9* 24.5* 20.9*  < > 29.4* 28.9* 27.8* 29.5* 28.9*  MCV 67.6* 67.2* 74.5* 74.9*  --   --   --  76.2*  --  76.7*  PLT 610* 614* 423* 360  --   --   --  358  --  369  < > = values in this interval not displayed. Cardiac Enzymes: No results for input(s): CKTOTAL, CKMB, CKMBINDEX, TROPONINI in the last 168 hours. BNP (last 3 results) No results for input(s): BNP in the last 8760 hours.  ProBNP (last 3 results) No results for input(s): PROBNP in the last 8760 hours.  CBG:  Recent Labs Lab 11/25/15 1806 11/25/15 2118 11/26/15 0809 11/26/15 1150 11/26/15 1639  GLUCAP 106* 114* 93 98 115*    Recent  Results (from the past 240 hour(s))  Culture, blood (single)     Status: None (Preliminary result)   Collection Time: 11/22/15  1:55 PM  Result Value Ref Range Status   Specimen Description BLOOD RIGHT ARM  Final   Special Requests BOTTLES DRAWN AEROBIC AND ANAEROBIC 5CC EACH  Final   Culture   Final    NO GROWTH 4 DAYS Performed at [facility]    Report Status PENDING  Incomplete  MRSA PCR Screening     Status: None   Collection Time: 11/22/15  5:01 PM  Result Value Ref Range Status   MRSA by PCR NEGATIVE NEGATIVE Final    Comment:        The GeneXpert MRSA Assay (FDA approved for NASAL specimens only), is one component of a comprehensive MRSA colonization surveillance program. It is not intended to diagnose MRSA infection nor to guide or monitor treatment for MRSA infections.      Studies:  Recent x-ray studies have been reviewed in detail by the Attending Physician  Scheduled Meds:  Scheduled Meds: . antiseptic oral rinse  7 mL Mouth Rinse BID  . baclofen  5 mg Oral BID   . insulin aspart  0-9 Units Subcutaneous TID WC  . magnesium hydroxide  30 mL Oral Once  . pantoprazole (PROTONIX) IV  40 mg Intravenous Q12H  . sodium chloride flush  3 mL Intravenous Q12H    Time spent on care of this patient: 40 mins   Kanasia Gayman, Geraldo Docker , MD  [facility] Office  [phone] Pager - [phone]  On-Call/Text Page:      [email or web address]      password [email or web address]  If 7PM-7AM, please contact night-coverage [email or web address] Password [email or web address] 11/26/2015, 7:00 PM   LOS: 4 days   Care during the described time interval was provided by me .  I have reviewed this patient's available data, including medical history, events of note, physical examination, and all test results as part of my evaluation. I have personally reviewed and interpreted all radiology studies.   Dia Crawford, MD [phone] Pager

## 2015-11-27 DIAGNOSIS — K922 Gastrointestinal hemorrhage, unspecified: Secondary | ICD-10-CM | POA: Diagnosis present

## 2015-11-27 DIAGNOSIS — E118 Type 2 diabetes mellitus with unspecified complications: Secondary | ICD-10-CM | POA: Diagnosis present

## 2015-11-27 LAB — COMPREHENSIVE METABOLIC PANEL
ALBUMIN: 2.1 g/dL — AB (ref 3.5–5.0)
ALK PHOS: 50 U/L (ref 38–126)
ALT: 15 U/L (ref 14–54)
ANION GAP: 11 (ref 5–15)
AST: 17 U/L (ref 15–41)
BUN: 6 mg/dL (ref 6–20)
CHLORIDE: 105 mmol/L (ref 101–111)
CO2: 22 mmol/L (ref 22–32)
Calcium: 8.5 mg/dL — ABNORMAL LOW (ref 8.9–10.3)
Creatinine, Ser: 0.57 mg/dL (ref 0.44–1.00)
GFR calc non Af Amer: >60 mL/min (ref >60)
GLUCOSE: 117 mg/dL — AB (ref 65–99)
Potassium: 3.7 mmol/L (ref 3.5–5.1)
Sodium: 138 mmol/L (ref 135–145)
TOTAL PROTEIN: 6.1 g/dL — AB (ref 6.5–8.1)
Total Bilirubin: 1.2 mg/dL (ref 0.3–1.2)

## 2015-11-27 LAB — CBC
HEMATOCRIT: 29.9 % — AB (ref 36.0–46.0)
HEMOGLOBIN: 9.2 g/dL — AB (ref 12.0–15.0)
MCH: 23.8 pg — AB (ref 26.0–34.0)
MCHC: 30.8 g/dL (ref 30.0–36.0)
MCV: 77.3 fL — ABNORMAL LOW (ref 78.0–100.0)
Platelets: 366 10*3/uL (ref 150–400)
RBC: 3.87 MIL/uL (ref 3.87–5.11)
RDW: 22.1 % — ABNORMAL HIGH (ref 11.5–15.5)
WBC: 10.7 10*3/uL — ABNORMAL HIGH (ref 4.0–10.5)

## 2015-11-27 LAB — CULTURE, BLOOD (SINGLE): Culture: NO GROWTH

## 2015-11-27 LAB — GLUCOSE, CAPILLARY
GLUCOSE-CAPILLARY: 111 mg/dL — AB (ref 65–99)
Glucose-Capillary: 120 mg/dL — ABNORMAL HIGH (ref 65–99)
Glucose-Capillary: 111 mg/dL — ABNORMAL HIGH (ref 65–99)
Glucose-Capillary: 192 mg/dL — ABNORMAL HIGH (ref 65–99)

## 2015-11-27 LAB — MAGNESIUM: Magnesium: 1.9 mg/dL (ref 1.7–2.4)

## 2015-11-27 MED ORDER — AMLODIPINE BESYLATE 10 MG PO TABS
10.0000 mg | ORAL_TABLET | Freq: Every day | ORAL | Status: DC
  Administered 2015-11-27 – 2015-11-29 (×3): 10 mg via ORAL
  Filled 2015-11-27 (×3): qty 1

## 2015-11-27 MED ORDER — LISINOPRIL 10 MG PO TABS
10.0000 mg | ORAL_TABLET | Freq: Every day | ORAL | Status: DC
  Administered 2015-11-27 – 2015-11-29 (×3): 10 mg via ORAL
  Filled 2015-11-27 (×3): qty 1

## 2015-11-27 NOTE — Progress Notes (Signed)
Patient's capsule endoscopy monitor has been removed. Placed in a patient belonging bag and is located at the nurses station for pick up in the morning.  Ermalinda Memos, RN

## 2015-11-27 NOTE — Progress Notes (Signed)
[place] TEAM 1 - Stepdown/ICU TEAM Progress Note  Jessica Abbott [id number] DOB: 1940-09-30 DOA: 11/22/2015 PCP: Nicola Girt, DO  Admit HPI / Brief Narrative: Jessica Abbott is a 75 year old female with PMHx Hypertension, DM without complication, chronic kidney disease, Peripheral vascular disease, arthritis,SOB. She is chronically in wheelchair and has not walked for several months.   She is unable to straighten out her R leg. Injured her foot in December and has been followed by vascular surgery and was scheduled to go to OR 3/14 for right femoral peroneal bypass grafting and transmetatarsal amputation. She has also been on coumadin due to this severe vascular disease and started having black stools about 2 weeks ago, she reported this at coumadin clinic and apparently the way she described her stool was not alarming. Over the past few days her weakness has significantly progressed, which caused her to present to ED at [facility]. In the ED on examination by the nurse it was noted that her right toes was black in color with foul odor and were unable to doppler her pedal or Tibial pulse. Upon arrival to ED her labs shows acute anaemia with Hb-3.3, WBC-12.1,platelets-614. X-Ray of foot also shows soft tissue gas and code sepsis was activated. Patient was given 1.5 liters fluid in the ER and 500 cc of blood.She was also started on Van/Zosyn. Patient is O neg and the blood bank only has 2 units of blood in the reserve and therefore patient is transferred on 3/7 to [facility].   HPI/Subjective: 3/12 AO 4, NAD. Counseled recovered, negative N/V, negative abdominal pain, positive right lower extremity pain waxing and waning..    Assessment/Plan: GI bleed/Melena/Acute blood loss anemia -Unfortunately EGD/Colonoscopy nondiagnostic for source of bleeding -Patient has required 4 units PRBC/2 FFP since admission, will trend H/H -q 4hr H/H shows continued bleeding;. -Per GI unable to perform  capsule study for another 48 hours. 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 -Antibiotics on hold; monitor temp, CBC very closely. -Echocardiogram; pulmonary hypertension see results below - Per vascular surgery not urgent and can wait until she fully recovers from her GI bleed   Hypertension -BP trending up  -Restart amlodipine 10 mg daily  -Restart lisinopril 10 mg daily   Severe pulmonary HTN /moderate AV & TV regurgitation  -See hypertension  Acute kidney failure -BUN/Cr are WNL  DM Type 2 controlled with complications -3/9 Hemoglobin A1c= 5.9 -Lipid panel; W/i ADA guidelines  -Sensitive SSI   Code Status: FULL Family Communication: no family present at time of exam Disposition Plan: GI bleeding appears resolved; awaiting official read from capsule from GI. Then reconsult vascular surgery for AKA/BKA right lower extremity    Consultants: Dr.Robert Buccini GI Dr Serafina Mitchell vascular surgery    Procedure/Significant Events: 3/7 right foot;Soft tissue swelling and soft tissue gas involving the distal great toe. Gas-forming infection cannot be excluded 3/8 transfused 1 unit PRBC 3/8 transfused 2 units FFP 3/8 EGD; 1 cm hiatal hernia 3/9 colonoscopy;Multiple medium-mouthed diverticula were found in the entire colon. 3/9 transfuse 3 units PRBC 3/10 echocardiogram;LVEF= 60%-65%. -(grade 1 diastolic dysfunction).  - Aortic valve: moderate regurgitation. -- Tricuspid valve:  moderate regurgitation. - Pulmonary arteries:  PA peak pressure: 66 mm      Culture 3/7 Blood Rt Arm NGTD  Antibiotics: Zosyn 3/7 1 dose Vancomycin 3/71  dose    DVT prophylaxis: SCD   Devices    LINES / TUBES:      Continuous Infusions: . sodium chloride 50 mL/hr at 11/27/15 0339    Objective: VITAL SIGNS: Temp: 98.6 F (37 C) (03/12 1701) Temp Source: Oral (03/12 1701) BP: 161/54 mmHg (03/12 1701) Pulse Rate: 74 (03/12 1701) SPO2; FIO2:   Intake/Output Summary (Last 24 hours) at 11/27/15 1730 Last data filed at 11/27/15 [id number]  Gross per 24 hour  Intake   1785 ml  Output      0 ml  Net   1785 ml     Exam: General:A/O 4, No acute respiratory distress Eyes: Negative headache, negative scleral hemorrhage ENT: Negative Runny nose, negative gingival bleeding, Neck:  Negative scars, masses, torticollis, lymphadenopathy, JVD Lungs: Clear to auscultation bilaterally without wheezes or crackles Cardiovascular: Regular rate and rhythm without murmur gallop or rub normal S1 and S2 Abdomen:negative abdominal pain, nondistended, positive soft, bowel sounds, no rebound, no ascites, no appreciable mass Extremities:  right lower foot black foul-smelling no pulse palpable, left lower extremity warm barely palpable DP/PT pulse  Psychiatric:  Negative depression, negative anxiety, negative fatigue, negative mania  Neurologic:  Cranial nerves II through XII intact, tongue/uvula midline, all extremities muscle strength 5/5, sensation intact throughout,negative dysarthria, negative expressive aphasia, negative receptive aphasia.   Data Reviewed: Basic Metabolic Panel:  Recent Labs Lab 11/23/15 0451 11/24/15 0323 11/25/15 1836 11/26/15 0613 11/27/15 0619  NA 139 137 137 137 138  K 4.2 3.7 3.8 3.8 3.7  CL 114* 111 109 109 105  CO2 18* 19* 20* 17* 22  GLUCOSE 125* 112* 111* 83 117*  BUN 13 8 5* 6 6  CREATININE 0.71 0.74 0.63 0.63 0.57  CALCIUM 8.1* 7.8* 8.1* 8.5* 8.5*  MG  --  1.8 1.9 1.8 1.9  PHOS  --  2.3*  --   --   --    Liver Function Tests:  Recent Labs Lab 11/22/15 1355 11/25/15 1836 11/26/15 0613  11/27/15 0619  AST 24 25 22 17   ALT 11* 15 15 15   ALKPHOS 47 57 54 50  BILITOT 0.5 0.9 1.1 1.2  PROT 7.1 6.2* 6.6 6.1*  ALBUMIN 2.7* 2.3* 2.4* 2.1*   No results for input(s): LIPASE, AMYLASE in the last 168 hours. No results for input(s): AMMONIA in the last 168 hours. CBC:  Recent Labs Lab 11/22/15 1355  11/23/15 0451 11/24/15 0323  11/25/15 0241 11/25/15 0602 11/25/15 1017 11/26/15 0613 11/27/15 0619  WBC 12.2*  < > 9.7 10.0  --   --  10.3  --  10.9* 10.7*  NEUTROABS 9.1*  --   --   --   --   --  8.5*  --   --   --   HGB 3.5*  < > 7.7* 6.7*  < > 9.2* 9.1* 9.2* 9.1* 9.2*  HCT 12.5*  < > 24.5* 20.9*  < > 28.9* 27.8* 29.5* 28.9* 29.9*  MCV 67.6*  < > 74.5* 74.9*  --   --  76.2*  --  76.7* 77.3*  PLT 610*  < > 423* 360  --   --  358  --  369 366  < > = values in this interval not displayed. Cardiac Enzymes: No results for input(s): CKTOTAL, CKMB, CKMBINDEX, TROPONINI in the last 168 hours. BNP (  last 3 results) No results for input(s): BNP in the last 8760 hours.  ProBNP (last 3 results) No results for input(s): PROBNP in the last 8760 hours.  CBG:  Recent Labs Lab 11/26/15 1150 11/26/15 1639 11/26/15 2136 11/27/15 0804 11/27/15 1225  GLUCAP 98 115* 126* 111* 120*    Recent Results (from the past 240 hour(s))  Culture, blood (single)     Status: None   Collection Time: 11/22/15  1:55 PM  Result Value Ref Range Status   Specimen Description BLOOD RIGHT ARM  Final   Special Requests BOTTLES DRAWN AEROBIC AND ANAEROBIC 5CC EACH  Final   Culture   Final    NO GROWTH 5 DAYS Performed at [facility]    Report Status 11/27/2015 FINAL  Final  MRSA PCR Screening     Status: None   Collection Time: 11/22/15  5:01 PM  Result Value Ref Range Status   MRSA by PCR NEGATIVE NEGATIVE Final    Comment:        The GeneXpert MRSA Assay (FDA approved for NASAL specimens only), is one component of a comprehensive MRSA colonization surveillance program. It is  not intended to diagnose MRSA infection nor to guide or monitor treatment for MRSA infections.      Studies:  Recent x-ray studies have been reviewed in detail by the Attending Physician  Scheduled Meds:  Scheduled Meds: . amLODipine  10 mg Oral Daily  . antiseptic oral rinse  7 mL Mouth Rinse BID  . baclofen  5 mg Oral BID  . insulin aspart  0-9 Units Subcutaneous TID WC  . lisinopril  10 mg Oral Daily  . magnesium hydroxide  30 mL Oral Once  . pantoprazole (PROTONIX) IV  40 mg Intravenous Q12H  . sodium chloride flush  3 mL Intravenous Q12H    Time spent on care of this patient: 40 mins   WOODS, Geraldo Docker , MD  [facility] Office  [phone] Pager - [phone]  On-Call/Text Page:      [email or web address]      password [email or web address]  If 7PM-7AM, please contact night-coverage [email or web address] Password [email or web address] 11/27/2015, 5:30 PM   LOS: 5 days   Care during the described time interval was provided by me .  I have reviewed this patient's available data, including medical history, events of note, physical examination, and all test results as part of my evaluation. I have personally reviewed and interpreted all radiology studies.   Dia Crawford, MD [phone] Pager

## 2015-11-28 ENCOUNTER — Encounter (HOSPITAL_COMMUNITY): Payer: Self-pay | Admitting: Gastroenterology

## 2015-11-28 DIAGNOSIS — I739 Peripheral vascular disease, unspecified: Secondary | ICD-10-CM

## 2015-11-28 DIAGNOSIS — K921 Melena: Principal | ICD-10-CM

## 2015-11-28 DIAGNOSIS — I96 Gangrene, not elsewhere classified: Secondary | ICD-10-CM

## 2015-11-28 LAB — COMPREHENSIVE METABOLIC PANEL
ALT: 13 U/L — ABNORMAL LOW (ref 14–54)
AST: 17 U/L (ref 15–41)
Albumin: 2.1 g/dL — ABNORMAL LOW (ref 3.5–5.0)
Alkaline Phosphatase: 51 U/L (ref 38–126)
Anion gap: 6 (ref 5–15)
BILIRUBIN TOTAL: 1.1 mg/dL (ref 0.3–1.2)
CALCIUM: 8.5 mg/dL — AB (ref 8.9–10.3)
CO2: 24 mmol/L (ref 22–32)
CREATININE: 0.56 mg/dL (ref 0.44–1.00)
Chloride: 108 mmol/L (ref 101–111)
GFR calc Af Amer: >60 mL/min (ref >60)
Glucose, Bld: 108 mg/dL — ABNORMAL HIGH (ref 65–99)
POTASSIUM: 3.8 mmol/L (ref 3.5–5.1)
Sodium: 138 mmol/L (ref 135–145)
TOTAL PROTEIN: 6.6 g/dL (ref 6.5–8.1)

## 2015-11-28 LAB — CBC
HCT: 29.8 % — ABNORMAL LOW (ref 36.0–46.0)
Hemoglobin: 9.1 g/dL — ABNORMAL LOW (ref 12.0–15.0)
MCH: 23.7 pg — AB (ref 26.0–34.0)
MCHC: 30.5 g/dL (ref 30.0–36.0)
MCV: 77.6 fL — ABNORMAL LOW (ref 78.0–100.0)
PLATELETS: 434 10*3/uL — AB (ref 150–400)
RBC: 3.84 MIL/uL — ABNORMAL LOW (ref 3.87–5.11)
RDW: 22.2 % — ABNORMAL HIGH (ref 11.5–15.5)
WBC: 10.5 10*3/uL (ref 4.0–10.5)

## 2015-11-28 LAB — MAGNESIUM: Magnesium: 1.6 mg/dL — ABNORMAL LOW (ref 1.7–2.4)

## 2015-11-28 LAB — GLUCOSE, CAPILLARY
GLUCOSE-CAPILLARY: 97 mg/dL (ref 65–99)
Glucose-Capillary: 125 mg/dL — ABNORMAL HIGH (ref 65–99)
Glucose-Capillary: 131 mg/dL — ABNORMAL HIGH (ref 65–99)

## 2015-11-28 MED ORDER — PANTOPRAZOLE SODIUM 40 MG PO TBEC
40.0000 mg | DELAYED_RELEASE_TABLET | Freq: Two times a day (BID) | ORAL | Status: DC
  Administered 2015-11-28 – 2015-11-29 (×3): 40 mg via ORAL
  Filled 2015-11-28 (×3): qty 1

## 2015-11-28 MED ORDER — GLUCERNA SHAKE PO LIQD
237.0000 mL | Freq: Three times a day (TID) | ORAL | Status: DC
  Administered 2015-11-28 – 2015-11-29 (×3): 237 mL via ORAL

## 2015-11-28 MED ORDER — PRO-STAT SUGAR FREE PO LIQD
30.0000 mL | Freq: Two times a day (BID) | ORAL | Status: DC
  Administered 2015-11-28 – 2015-11-29 (×3): 30 mL via ORAL
  Filled 2015-11-28 (×4): qty 30

## 2015-11-28 MED ORDER — GUAIFENESIN-DM 100-10 MG/5ML PO SYRP
5.0000 mL | ORAL_SOLUTION | ORAL | Status: DC | PRN
  Filled 2015-11-28: qty 5

## 2015-11-28 MED ORDER — MAGNESIUM SULFATE 2 GM/50ML IV SOLN
2.0000 g | Freq: Once | INTRAVENOUS | Status: AC
  Administered 2015-11-28: 2 g via INTRAVENOUS
  Filled 2015-11-28 (×2): qty 50

## 2015-11-28 NOTE — Progress Notes (Signed)
Initial Nutrition Assessment  DOCUMENTATION CODES:   Not applicable  INTERVENTION:  Provide Glucerna Shake po TID, each supplement provides 220 kcal and 10 grams of protein.  Provide 30 ml Prostat po BID, each supplement provides 100 kcal and 15 grams of protein.   Encourage adequate PO intake.   NUTRITION DIAGNOSIS:   Increased nutrient needs related to wound healing as evidenced by estimated needs.  GOAL:   Patient will meet greater than or equal to 90% of their needs  MONITOR:   PO intake, Supplement acceptance, Weight trends, Labs, I & O's, Skin  REASON FOR ASSESSMENT:   Low Braden    ASSESSMENT:   75 year old female with PMHx Hypertension, DM without complication, chronic kidney disease, Peripheral vascular disease, arthritis,SOB. She is chronically in wheelchair and has not walked for several months. Presents with GI bleed and gas gangrene of R foot.  Meal completion has been 0-50%. Pt reports appetite is fine currently and PTA with usual consumption of at least 2 meals a day with snacks in between. Pt with no weight loss. Pt is agreeable to nutritional supplements to aid in caloric and protein needs as well as in wound healing. RD to order. Pt encouraged to eat her food at meals.   Unable to complete Nutrition-Focused physical exam at this time.   Labs and medications reviewed.   Diet Order:  Diet Heart Room service appropriate?: Yes; Fluid consistency:: Thin  Skin:  Wound (see comment) (Gas gangrene R foot)  Last BM:  3/10  Height:   Ht Readings from Last 1 Encounters:  11/25/15 5\' 2"  (1.575 m)    Weight:   Wt Readings from Last 1 Encounters:  11/25/15 152 lb (68.947 kg)    Ideal Body Weight:  50 kg  BMI:  Body mass index is 27.79 kg/(m^2).  Estimated Nutritional Needs:   Kcal:  1900-2100  Protein:  90-105 grams  Fluid:  1.9 - 2.1 L/day  EDUCATION NEEDS:   No education needs identified at this time  Corrin Parker, MS, RD, LDN Pager  # [phone] After hours/ weekend pager # [phone]

## 2015-11-28 NOTE — Care Management Important Message (Signed)
Important Message  Patient Details  Name: Jessica Abbott MRN: [id number] Date of Birth: Aug 08, 1941   Medicare Important Message Given:  Other (see comment) (Not yet needed per NCM)    Jessica Abbott Jessica Abbott 11/28/2015, 12:34 PM

## 2015-11-28 NOTE — Progress Notes (Addendum)
       Patient states she does not have increased pain with her ischemic right foot.    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 She has a chronic contracture of the right knee which she states has been there for 6 months.  Current recommendation is right primary amputation once her GI bleed is stable.  GI bleeding appears resolved; awaiting official read from capsule from GI. Then reconsult vascular surgery for AKA/BKA right lower extremity   No rush for amputation per Dr. Trula Slade.  If patient is stable and wants to return home she can follow up in our office in 2 weeks for further discussion regarding amputation.  Keeghan Mcintire MAUREEN PA-C

## 2015-11-28 NOTE — Progress Notes (Signed)
Progress Note  Jessica Abbott [id number] DOB: June 01, 1941 DOA: 11/22/2015 PCP: Nicola Girt, DO  Admit HPI / Brief Narrative: Jessica Abbott is a 75 year old female with PMHx Hypertension, DM without complication, chronic kidney disease, Peripheral vascular disease, arthritis,SOB. She is chronically in wheelchair and has not walked for several months.   She is unable to straighten out her R leg. Injured her foot in December and has been followed by vascular surgery and was scheduled to go to OR 3/14 for right femoral peroneal bypass grafting and transmetatarsal amputation. She has also been on coumadin due to this severe vascular disease and started having black stools about 2 weeks ago, she reported this at coumadin clinic and apparently the way she described her stool was not alarming. Over the past few days her weakness has significantly progressed, which caused her to present to ED at [facility]. In the ED on examination by the nurse it was noted that her right toes was black in color with foul odor and were unable to doppler her pedal or Tibial pulse. 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 -Antibiotics on hold; monitor temp, CBC very closely. -Echocardiogram; pulmonary hypertension see results below - Per  vascular surgery not urgent and can wait until she fully recovers from her GI bleed   Hypertension -BP trending up  -Restart amlodipine 10 mg daily  -Restart lisinopril 10 mg daily   Severe pulmonary HTN /moderate AV & TV regurgitation  -See hypertension  Acute kidney failure -BUN/Cr are WNL  DM Type 2 controlled with complications -3/9 Hemoglobin A1c= 5.9 -Lipid panel; W/i ADA guidelines  -Sensitive SSI   Code Status: FULL Family Communication: no family present at time of exam Disposition Plan: GI bleeding appears resolved; awaiting official read from capsule from GI. Then reconsult vascular surgery for AKA/BKA right lower extremity    Consultants: Dr.Robert Buccini GI Dr Serafina Mitchell vascular surgery    Procedure/Significant Events: 3/7 right foot;Soft tissue swelling and soft tissue gas involving the distal great toe. Gas-forming infection cannot be excluded 3/8 transfused 1 unit PRBC 3/8 transfused 2 units FFP 3/8 EGD; 1 cm hiatal hernia 3/9 colonoscopy;Multiple medium-mouthed diverticula were found in the entire colon. 3/9 transfuse 3 units PRBC 3/10 echocardiogram;LVEF= 60%-65%. -(grade 1 diastolic dysfunction).  - Aortic valve: moderate regurgitation. -- Tricuspid valve:  moderate regurgitation. - Pulmonary arteries:  PA peak pressure: 66 mm      Culture 3/7 Blood Rt Arm NGTD  Antibiotics: Zosyn 3/7 1 dose Vancomycin 3/71 dose    DVT prophylaxis: SCD   Devices    LINES / TUBES:      Continuous Infusions: . sodium chloride 50 mL/hr at 11/27/15 2209    Objective: VITAL SIGNS: Temp: 98.7 F (37.1 C) (03/13 1025) Temp Source: Oral (03/13 1025) BP: 146/84 mmHg (03/13 1025) Pulse Rate: 85 (03/13 1025) SPO2; FIO2:   Intake/Output Summary (Last 24 hours)  at 11/28/15 1423 Last data filed at 11/28/15 0900  Gross per 24 hour  Intake    240 ml  Output      0 ml  Net    240 ml     Exam: General:A/O 4, No acute respiratory  distress Lungs: Clear to auscultation bilaterally without wheezes or crackles Cardiovascular: Regular rate and rhythm without murmur gallop or rub normal S1 and S2 Abdomen:negative abdominal pain, nondistended, positive soft, bowel sounds, no rebound, no ascites, no appreciable mass Extremities:  right lower foot black foul-smelling no pulse palpable, left lower extremity warm barely palpable DP/PT pulse    Data Reviewed: Basic Metabolic Panel:  Recent Labs Lab 11/24/15 0323 11/25/15 1836 11/26/15 0613 11/27/15 0619 11/28/15 0728  NA 137 137 137 138 138  K 3.7 3.8 3.8 3.7 3.8  CL 111 109 109 105 108  CO2 19* 20* 17* 22 24  GLUCOSE 112* 111* 83 117* 108*  BUN 8 5* 6 6 <5*  CREATININE 0.74 0.63 0.63 0.57 0.56  CALCIUM 7.8* 8.1* 8.5* 8.5* 8.5*  MG 1.8 1.9 1.8 1.9 1.6*  PHOS 2.3*  --   --   --   --    Liver Function Tests:  Recent Labs Lab 11/22/15 1355 11/25/15 1836 11/26/15 0613 11/27/15 0619 11/28/15 0728  AST 24 25 22 17 17   ALT 11* 15 15 15  13*  ALKPHOS 47 57 54 50 51  BILITOT 0.5 0.9 1.1 1.2 1.1  PROT 7.1 6.2* 6.6 6.1* 6.6  ALBUMIN 2.7* 2.3* 2.4* 2.1* 2.1*   No results for input(s): LIPASE, AMYLASE in the last 168 hours. No results for input(s): AMMONIA in the last 168 hours. CBC:  Recent Labs Lab 11/22/15 1355  11/24/15 0323  11/25/15 0602 11/25/15 1017 11/26/15 0613 11/27/15 0619 11/28/15 0728  WBC 12.2*  < > 10.0  --  10.3  --  10.9* 10.7* 10.5  NEUTROABS 9.1*  --   --   --  8.5*  --   --   --   --   HGB 3.5*  < > 6.7*  < > 9.1* 9.2* 9.1* 9.2* 9.1*  HCT 12.5*  < > 20.9*  < > 27.8* 29.5* 28.9* 29.9* 29.8*  MCV 67.6*  < > 74.9*  --  76.2*  --  76.7* 77.3* 77.6*  PLT 610*  < > 360  --  358  --  369 366 434*  < > = values in this interval not displayed. Cardiac Enzymes: No results for input(s): CKTOTAL, CKMB, CKMBINDEX, TROPONINI in the last 168 hours. BNP (last 3 results) No results for input(s): BNP in the last 8760 hours.  ProBNP (last 3  results) No results for input(s): PROBNP in the last 8760 hours.  CBG:  Recent Labs Lab 11/27/15 1225 11/27/15 1658 11/27/15 2224 11/28/15 0821 11/28/15 1206  GLUCAP 120* 111* 192* 97 125*    Recent Results (from the past 240 hour(s))  Culture, blood (single)     Status: None   Collection Time: 11/22/15  1:55 PM  Result Value Ref Range Status   Specimen Description BLOOD RIGHT ARM  Final   Special Requests BOTTLES DRAWN AEROBIC AND ANAEROBIC 5CC EACH  Final   Culture   Final    NO GROWTH 5 DAYS Performed at [facility]    Report Status 11/27/2015 FINAL  Final  MRSA PCR Screening     Status: None   Collection Time: 11/22/15  5:01 PM  Result Value Ref Range  Status   MRSA by PCR NEGATIVE NEGATIVE Final    Comment:        The GeneXpert MRSA Assay (FDA approved for NASAL specimens only), is one component of a comprehensive MRSA colonization surveillance program. It is not intended to diagnose MRSA infection nor to guide or monitor treatment for MRSA infections.        Scheduled Meds:  Scheduled Meds: . amLODipine  10 mg Oral Daily  . antiseptic oral rinse  7 mL Mouth Rinse BID  . baclofen  5 mg Oral BID  . insulin aspart  0-9 Units Subcutaneous TID WC  . lisinopril  10 mg Oral Daily  . magnesium hydroxide  30 mL Oral Once  . pantoprazole (PROTONIX) IV  40 mg Intravenous Q12H  . sodium chloride flush  3 mL Intravenous Q12H    Time spent on care of this patient: 25 mins   [facility] Office  [phone] Pager - [phone]  On-Call/Text Page:      [email or web address]      password [email or web address]  If 7PM-7AM, please contact night-coverage [email or web address] Password [email or web address] 11/28/2015, 2:23 PM   LOS: 6 days

## 2015-11-29 ENCOUNTER — Inpatient Hospital Stay (HOSPITAL_COMMUNITY): Admission: RE | Admit: 2015-11-29 | Payer: Medicare Other | Source: Ambulatory Visit | Admitting: Surgery

## 2015-11-29 ENCOUNTER — Encounter (HOSPITAL_COMMUNITY): Admission: RE | Payer: Self-pay | Source: Ambulatory Visit

## 2015-11-29 DIAGNOSIS — E118 Type 2 diabetes mellitus with unspecified complications: Secondary | ICD-10-CM

## 2015-11-29 DIAGNOSIS — I70234 Atherosclerosis of native arteries of right leg with ulceration of heel and midfoot: Secondary | ICD-10-CM

## 2015-11-29 LAB — URINALYSIS, ROUTINE W REFLEX MICROSCOPIC
Bilirubin Urine: NEGATIVE
Glucose, UA: NEGATIVE mg/dL
Hgb urine dipstick: NEGATIVE
Ketones, ur: NEGATIVE mg/dL
Leukocytes, UA: NEGATIVE
NITRITE: NEGATIVE
Protein, ur: NEGATIVE mg/dL
SPECIFIC GRAVITY, URINE: 1.008 (ref 1.005–1.030)
pH: 7.5 (ref 5.0–8.0)

## 2015-11-29 LAB — MAGNESIUM: Magnesium: 2.1 mg/dL (ref 1.7–2.4)

## 2015-11-29 LAB — COMPREHENSIVE METABOLIC PANEL
ALBUMIN: 2.1 g/dL — AB (ref 3.5–5.0)
ALK PHOS: 51 U/L (ref 38–126)
ALT: 13 U/L — AB (ref 14–54)
ANION GAP: 9 (ref 5–15)
AST: 19 U/L (ref 15–41)
BUN: 8 mg/dL (ref 6–20)
CALCIUM: 8.5 mg/dL — AB (ref 8.9–10.3)
CO2: 24 mmol/L (ref 22–32)
CREATININE: 0.59 mg/dL (ref 0.44–1.00)
Chloride: 106 mmol/L (ref 101–111)
GFR calc Af Amer: >60 mL/min (ref >60)
GFR calc non Af Amer: >60 mL/min (ref >60)
GLUCOSE: 134 mg/dL — AB (ref 65–99)
Potassium: 3.9 mmol/L (ref 3.5–5.1)
SODIUM: 139 mmol/L (ref 135–145)
Total Bilirubin: 0.7 mg/dL (ref 0.3–1.2)
Total Protein: 6.3 g/dL — ABNORMAL LOW (ref 6.5–8.1)

## 2015-11-29 LAB — CBC
HCT: 29.6 % — ABNORMAL LOW (ref 36.0–46.0)
Hemoglobin: 8.9 g/dL — ABNORMAL LOW (ref 12.0–15.0)
MCH: 23.4 pg — AB (ref 26.0–34.0)
MCHC: 30.1 g/dL (ref 30.0–36.0)
MCV: 77.7 fL — AB (ref 78.0–100.0)
PLATELETS: 429 10*3/uL — AB (ref 150–400)
RBC: 3.81 MIL/uL — AB (ref 3.87–5.11)
RDW: 22.3 % — ABNORMAL HIGH (ref 11.5–15.5)
WBC: 9.6 10*3/uL (ref 4.0–10.5)

## 2015-11-29 LAB — GLUCOSE, CAPILLARY: Glucose-Capillary: 112 mg/dL — ABNORMAL HIGH (ref 65–99)

## 2015-11-29 SURGERY — CREATION, BYPASS, ARTERIAL, FEMORAL TO PERONEAL, USING GRAFT
Anesthesia: General | Laterality: Right

## 2015-11-29 MED ORDER — GLUCERNA SHAKE PO LIQD: 237.0000 mL | Freq: Three times a day (TID) | ORAL | Status: DC

## 2015-11-29 MED ORDER — BACLOFEN 10 MG PO TABS: 5.0000 mg | ORAL_TABLET | Freq: Two times a day (BID) | ORAL | Status: DC

## 2015-11-29 MED ORDER — PANTOPRAZOLE SODIUM 40 MG PO TBEC: 40.0000 mg | DELAYED_RELEASE_TABLET | Freq: Two times a day (BID) | ORAL | Status: DC

## 2015-11-29 NOTE — Progress Notes (Signed)
[facility] Progress Note  Subjective: No new complaints currently speaking with surgery about possible amputation  Objective: Vital signs in last 24 hours: Temp:  [98.5 F (36.9 C)-99.5 F (37.5 C)] 99.5 F (37.5 C) (03/14 0426) Pulse Rate:  [85-88] 88 (03/14 0426) Resp:  [18-22] 22 (03/14 0426) BP: (142-151)/(42-84) 145/53 mmHg (03/14 0426) SpO2:  [95 %-99 %] 96 % (03/14 0426) Weight:  [67.132 kg (148 lb)] 67.132 kg (148 lb) (03/13 1709) Weight change:    PE: Unchanged  Lab Results: Results for orders placed or performed during the hospital encounter of 11/22/15 (from the past 24 hour(s))  Glucose, capillary     Status: Abnormal   Collection Time: 11/28/15 12:06 PM  Result Value Ref Range   Glucose-Capillary 125 (H) 65 - 99 mg/dL  Glucose, capillary     Status: Abnormal   Collection Time: 11/28/15  4:48 PM  Result Value Ref Range   Glucose-Capillary 131 (H) 65 - 99 mg/dL  Urinalysis, Routine w reflex microscopic (not at [facility])     Status: Abnormal   Collection Time: 11/29/15  5:05 AM  Result Value Ref Range   Color, Urine YELLOW YELLOW   APPearance CLOUDY (A) CLEAR   Specific Gravity, Urine 1.008 1.005 - 1.030   pH 7.5 5.0 - 8.0   Glucose, UA NEGATIVE NEGATIVE mg/dL   Hgb urine dipstick NEGATIVE NEGATIVE   Bilirubin Urine NEGATIVE NEGATIVE   Ketones, ur NEGATIVE NEGATIVE mg/dL   Protein, ur NEGATIVE NEGATIVE mg/dL   Nitrite NEGATIVE NEGATIVE   Leukocytes, UA NEGATIVE NEGATIVE  Comprehensive metabolic panel     Status: Abnormal   Collection Time: 11/29/15  6:04 AM  Result Value Ref Range   Sodium 139 135 - 145 mmol/L   Potassium 3.9 3.5 - 5.1 mmol/L   Chloride 106 101 - 111 mmol/L   CO2 24 22 - 32 mmol/L   Glucose, Bld 134 (H) 65 - 99 mg/dL   BUN 8 6 - 20 mg/dL   Creatinine, Ser 0.59 0.44 - 1.00 mg/dL   Calcium 8.5 (L) 8.9 - 10.3 mg/dL   Total Protein 6.3 (L) 6.5 - 8.1 g/dL   Albumin 2.1 (L) 3.5 - 5.0 g/dL   AST 19 15 - 41 U/L   ALT 13 (L) 14 - 54  U/L   Alkaline Phosphatase 51 38 - 126 U/L   Total Bilirubin 0.7 0.3 - 1.2 mg/dL   GFR calc non Af Amer >60 >60 mL/min   GFR calc Af Amer >60 >60 mL/min   Anion gap 9 5 - 15  Magnesium     Status: None   Collection Time: 11/29/15  6:04 AM  Result Value Ref Range   Magnesium 2.1 1.7 - 2.4 mg/dL  CBC     Status: Abnormal   Collection Time: 11/29/15  6:04 AM  Result Value Ref Range   WBC 9.6 4.0 - 10.5 K/uL   RBC 3.81 (L) 3.87 - 5.11 MIL/uL   Hemoglobin 8.9 (L) 12.0 - 15.0 g/dL   HCT 29.6 (L) 36.0 - 46.0 %   MCV 77.7 (L) 78.0 - 100.0 fL   MCH 23.4 (L) 26.0 - 34.0 pg   MCHC 30.1 30.0 - 36.0 g/dL   RDW 22.3 (H) 11.5 - 15.5 %   Platelets 429 (H) 150 - 400 K/uL  Glucose, capillary     Status: Abnormal   Collection Time: 11/29/15  7:31 AM  Result Value Ref Range   Glucose-Capillary 112 (H) 65 - 99  mg/dL    Studies/Results: No results found.    Assessment: Anemia, negative EGD and colonoscopy. Capsule endoscopy also normal.  Plan: We'll sign off for now.    Schneider Warchol C 11/29/2015, 9:36 AM  Pager [phone] If no answer or after 5 PM call [phone]

## 2015-11-29 NOTE — Discharge Summary (Signed)
Physician Discharge Summary  Jessica Abbott [id number] DOB: 03/31/1941 DOA: 11/22/2015  PCP: Doug Sou B, DO  Admit date: 11/22/2015 Discharge date: 11/29/2015  Time spent: 35 minutes  Recommendations for Outpatient Follow-up:  1. 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 She is chronically in wheelchair and has not walked for several months. She is unable to straighten out her R leg. Injured her foot in December and has been followed by vascular surgery and was scheduled to go to OR 3/14 for right femoral peroneal bypass grafting and transmetatarsal amputation. She has also been on coumadin due to this severe vascular disease and started having black stools about 2 weeks ago, she reported this at coumadin clinic and apparently the way she described her stool was not alarming. Over the past few days her weakness has significantly progressed, which caused her to present to ED at [facility]. In the ED on examination by the nurse it was noted that her right toes was black in color with foul odor and were unable to doppler her pedal or Tibial pulse. Upon arrival to ED her  labs shows acute anaemia with Hb-3.3, WBC-12.1,platelets-614. X-Ray of foot also shows soft tissue gas and code sepsis was activated. Patient was given 1.5 liters fluid in the ER and 500 cc of blood.She was also started on Van/Zosyn. Patient is O neg and the blood bank only has 2 units of blood in the reserve and therefore patient is transferred on 3/7 to [facility].  Hospital Course:  GI bleed/Melena/Acute blood loss anemia -Unfortunately EGD/Colonoscopy nondiagnostic for source of bleeding -Patient has required 4 units PRBC/2 FFP since admission, H/H stable -Continue Protonix for 1 month -Continue to hold Coumadin -3/10 RBC scan negative -3/12 Capsule recovered ; No source of bleeding -PO Fe  Severe PVD/Right foot Gas Gangrene on Coumadin -Right foot x-ray showing gas forming infection see results below -Elevated lactic likely secondary to ischemic Rt foot. -Antibiotics held-- no temp, no WBC count -Echocardiogram; pulmonary hypertension see results below - Per vascular surgery not urgent and can wait until she fully recovers from her GI bleed- outpatient amputation  Hypertension -BP trending up  -Restart amlodipine 10 mg daily  -Restart lisinopril 10 mg daily   Severe pulmonary HTN /moderate AV & TV regurgitation  -See hypertension  Acute kidney failure -BUN/Cr are WNL  DM Type 2 controlled with complications -3/9 Hemoglobin A1c= 5.9 -Lipid panel; W/i ADA guidelines    Procedures:  EGD  Colonoscopy  Capsule endoscopy  Consultations:  Vascular  PCCM  GI  Discharge Exam: Filed Vitals:   11/29/15 0426 11/29/15 1000  BP: 145/53 144/49  Pulse: 88 78  Temp: 99.5 F (  37.5 C) 97.4 F (36.3 C)  Resp: 22 20    General: awake, anxious to go home- foul smelling foot Cardiovascular: rrr Respiratory: clear  Discharge Instructions   Discharge Instructions    Diet - low sodium heart healthy    Complete by:  As directed      Diet Carb Modified    Complete by:   As directed      Discharge instructions    Complete by:  As directed   Follow up with Dr. Trula Slade for surgical plans     Increase activity slowly    Complete by:  As directed           Current Discharge Medication List    START taking these medications   Details  feeding supplement, GLUCERNA SHAKE, (GLUCERNA SHAKE) LIQD Take 237 mLs by mouth 3 (three) times daily between meals. Qty: 20 Can, Refills: 0    pantoprazole (PROTONIX) 40 MG tablet Take 1 tablet (40 mg total) by mouth 2 (two) times daily. Qty: 60 tablet, Refills: 0      CONTINUE these medications which have CHANGED   Details  baclofen (LIORESAL) 10 MG tablet Take 0.5 tablets (5 mg total) by mouth 2 (two) times daily. Qty: 30 each, Refills: 0      CONTINUE these medications which have NOT CHANGED   Details  acetaminophen (TYLENOL) 500 MG tablet Take 500 mg by mouth every 6 (six) hours as needed (pain).    albuterol (PROVENTIL HFA;VENTOLIN HFA) 108 (90 Base) MCG/ACT inhaler Inhale 2 puffs into the lungs every 6 (six) hours as needed for wheezing or shortness of breath.    amLODipine (NORVASC) 10 MG tablet Take 10 mg by mouth daily.    aspirin EC 81 MG tablet Take 81 mg by mouth daily.     Cholecalciferol (VITAMIN D3) 2000 units capsule Take 2,000 Units by mouth daily.     docusate sodium (COLACE) 100 MG capsule Take 100 mg by mouth daily as needed (constipation).    ferrous sulfate 325 (65 FE) MG tablet Take 325 mg by mouth daily with breakfast.    HYDROcodone-acetaminophen (NORCO/VICODIN) 5-325 MG tablet Take 1 tablet by mouth every 6 (six) hours as needed (pain). Reported on 10/28/2015    lisinopril (PRINIVIL,ZESTRIL) 10 MG tablet Take 10 mg by mouth daily.    metFORMIN (GLUCOPHAGE) 500 MG tablet Take 500 mg by mouth daily with breakfast.      STOP taking these medications     warfarin (COUMADIN) 4 MG tablet        No Known Allergies Follow-up Information    Follow up with Doug Sou B, DO In 1  week.   Specialty:  Internal Medicine   Contact information:   [id number] [place] [address] [phone]       Follow up with Annamarie Major, MD In 1 week.   Specialties:  Vascular Surgery, Cardiology   Contact information:   [address] [place] [address] [phone]        The results of significant diagnostics from this hospitalization (including imaging, microbiology, ancillary and laboratory) are listed below for reference.    Significant Diagnostic Studies: Nm Gi Blood Loss  11/25/2015  CLINICAL DATA:  Dark stools off and on, heme-positive stool, melena, hemoglobin 3.3, underwent blood transfusions, gastrointestinal hemorrhage of unspecified location, hypertension, diabetes mellitus, peripheral vascular disease EXAM: NUCLEAR MEDICINE GASTROINTESTINAL BLEEDING SCAN TECHNIQUE: Sequential abdominal images were obtained following intravenous administration of Tc-42m labeled red  blood cells. RADIOPHARMACEUTICALS:  25.1 mCi Tc-55m in-vitro labeled autologous red cells. COMPARISON:  None; correlation CT abdomen and pelvis 04/01/2014 FINDINGS: Normal blood pool distribution of tracer. Excretion of de-labeled tracer into urinary bladder over the 2 hours of imaging. No abnormal gastrointestinal localization of tracer identified to suggest active GI bleeding. Patient's RIGHT leg is flexed throughout the exam. IMPRESSION: No scintigraphic evidence of active GI bleeding. Electronically Signed   By: Lavonia Dana M.D.   On: 11/25/2015 17:06   Dg Foot Complete Right  11/22/2015  CLINICAL DATA:  Right foot pain. Injury in December. Initial encounter. EXAM: RIGHT FOOT COMPLETE - 3+ VIEW COMPARISON:  None. FINDINGS: There is no evidence of fracture or dislocation. No evidence osteolysis or periostitis. Generalized osteopenia noted. Soft tissue swelling and soft tissue gas involving the distal great toe. IMPRESSION: Soft tissue swelling and soft tissue gas involving the distal  great toe. Gas-forming infection cannot be excluded. No acute osseous abnormality identified. Electronically Signed   By: Earle Gell M.D.   On: 11/22/2015 14:57    Microbiology: Recent Results (from the past 240 hour(s))  Culture, blood (single)     Status: None   Collection Time: 11/22/15  1:55 PM  Result Value Ref Range Status   Specimen Description BLOOD RIGHT ARM  Final   Special Requests BOTTLES DRAWN AEROBIC AND ANAEROBIC 5CC EACH  Final   Culture   Final    NO GROWTH 5 DAYS Performed at [facility]    Report Status 11/27/2015 FINAL  Final  MRSA PCR Screening     Status: None   Collection Time: 11/22/15  5:01 PM  Result Value Ref Range Status   MRSA by PCR NEGATIVE NEGATIVE Final    Comment:        The GeneXpert MRSA Assay (FDA approved for NASAL specimens only), is one component of a comprehensive MRSA colonization surveillance program. It is not intended to diagnose MRSA infection nor to guide or monitor treatment for MRSA infections.      Labs: Basic Metabolic Panel:  Recent Labs Lab 11/24/15 0323 11/25/15 1836 11/26/15 [id number] 11/27/15 0619 11/28/15 0728 11/29/15 0604  NA 137 137 137 138 138 139  K 3.7 3.8 3.8 3.7 3.8 3.9  CL 111 109 109 105 108 106  CO2 19* 20* 17* 22 24 24   GLUCOSE 112* 111* 83 117* 108* 134*  BUN 8 5* 6 6 <5* 8  CREATININE 0.74 0.63 0.63 0.57 0.56 0.59  CALCIUM 7.8* 8.1* 8.5* 8.5* 8.5* 8.5*  MG 1.8 1.9 1.8 1.9 1.6* 2.1  PHOS 2.3*  --   --   --   --   --    Liver Function Tests:  Recent Labs Lab 11/25/15 1836 11/26/15 0613 11/27/15 0619 11/28/15 0728 11/29/15 0604  AST 25 22 17 17 19   ALT 15 15 15  13* 13*  ALKPHOS 57 54 50 51 51  BILITOT 0.9 1.1 1.2 1.1 0.7  PROT 6.2* 6.6 6.1* 6.6 6.3*  ALBUMIN 2.3* 2.4* 2.1* 2.1* 2.1*   No results for input(s): LIPASE, AMYLASE in the last 168 hours. No results for input(s): AMMONIA in the last 168 hours. CBC:  Recent Labs Lab 11/22/15 1355  11/25/15 0602 11/25/15 1017  11/26/15 0613 11/27/15 0619 11/28/15 0728 11/29/15 0604  WBC 12.2*  < > 10.3  --  10.9* 10.7* 10.5 9.6  NEUTROABS 9.1*  --  8.5*  --   --   --   --   --  HGB 3.5*  < > 9.1* 9.2* 9.1* 9.2* 9.1* 8.9*  HCT 12.5*  < > 27.8* 29.5* 28.9* 29.9* 29.8* 29.6*  MCV 67.6*  < > 76.2*  --  76.7* 77.3* 77.6* 77.7*  PLT 610*  < > 358  --  369 366 434* 429*  < > = values in this interval not displayed. Cardiac Enzymes: No results for input(s): CKTOTAL, CKMB, CKMBINDEX, TROPONINI in the last 168 hours. BNP: BNP (last 3 results) No results for input(s): BNP in the last 8760 hours.  ProBNP (last 3 results) No results for input(s): PROBNP in the last 8760 hours.  CBG:  Recent Labs Lab 11/27/15 2224 11/28/15 0821 11/28/15 1206 11/28/15 1648 11/29/15 0731  GLUCAP 192* 97 125* 131* 112*       Signed:  Shaelyn Decarli U Jenene Kauffmann DO.  [facility] 11/29/2015, 10:59 AM

## 2015-11-29 NOTE — Progress Notes (Signed)
Pt's O2 sats 98% on RA while at rest in bed. Unable to test O2 sats with activity - pt is non ambulatory.

## 2015-11-29 NOTE — Care Management Note (Signed)
Case Management Note  Patient Details  Name: Jessica Abbott MRN: [id number] Date of Birth: 1940/12/08  Subjective/Objective:        CM following for progression and d/c planning.            Action/Plan: 11/29/2015 No HH or DME needs identified. Pt to d/c to home to await surgery. Pt does not qualify for home oxygen as sats 98% on room air at rest and pt nonambulatory.   Expected Discharge Date:      11/29/2015            Expected Discharge Plan:  Home/Self Care  In-House Referral:     Discharge planning Services  CM Consult  Post Acute Care Choice:  NA Choice offered to:  NA  DME Arranged:  N/A DME Agency:  NA  HH Arranged:  NA HH Agency:  NA  Status of Service:  Completed, signed off  Medicare Important Message Given:  Other (see comment) (Not yet needed per NCM) Date Medicare IM Given:    Medicare IM give by:    Date Additional Medicare IM Given:    Additional Medicare Important Message give by:     If discussed at [place] of Stay Meetings, dates discussed:    Additional Comments:  Adron Bene, RN 11/29/2015, 3:15 PM

## 2015-11-29 NOTE — Progress Notes (Signed)
    Subjective  -   No complaints   Physical Exam:  Ischemic changes to right foot with contracture of the right knee       Assessment/Plan:    I discussed with the patient that I do not feel proceeding with femoral peroneal bypass graft is the appropriate course of action, after her GI bleed.  I think the most appropriate intervention is a right above-knee amputation.  She understands this.  This was also recommended to her by her vascular surgeon at [facility].  From my perspective she can be discharged to home.  My office will contact her at a later date to arrange for her amputation once she has come to grips with the fact that this is the next step in her care.  Annamarie Major 11/29/2015 10:55 AM --  Filed Vitals:   11/29/15 0426 11/29/15 1000  BP: 145/53 144/49  Pulse: 88 78  Temp: 99.5 F (37.5 C) 97.4 F (36.3 C)  Resp: 22 20   No intake or output data in the 24 hours ending 11/29/15 1055   Laboratory CBC    Component Value Date/Time   WBC 9.6 11/29/2015 0604   HGB 8.9* 11/29/2015 0604   HCT 29.6* 11/29/2015 0604   PLT 429* 11/29/2015 0604    BMET    Component Value Date/Time   NA 139 11/29/2015 0604   K 3.9 11/29/2015 0604   CL 106 11/29/2015 0604   CO2 24 11/29/2015 0604   GLUCOSE 134* 11/29/2015 0604   BUN 8 11/29/2015 0604   CREATININE 0.59 11/29/2015 0604   CALCIUM 8.5* 11/29/2015 0604   GFRNONAA >60 11/29/2015 0604   GFRAA >60 11/29/2015 0604    COAG Lab Results  Component Value Date   INR 1.56* 11/25/2015   INR 1.68* 11/24/2015   INR 1.66* 11/23/2015   No results found for: PTT  Antibiotics Anti-infectives    Start     Dose/Rate Route Frequency Ordered Stop   11/23/15 2030  vancomycin (VANCOCIN) IVPB 1000 mg/200 mL premix  Status:  Discontinued     1,000 mg 200 mL/hr over 60 Minutes Intravenous Every 24 hours 11/22/15 2006 11/22/15 2258   11/23/15 1600  vancomycin (VANCOCIN) IVPB 1000 mg/200 mL premix  Status:  Discontinued     1,000 mg 200 mL/hr over 60 Minutes Intravenous Every 24 hours 11/22/15 1516 11/22/15 2006   11/22/15 2200  piperacillin-tazobactam (ZOSYN) IVPB 3.375 g  Status:  Discontinued     3.375 g 12.5 mL/hr over 240 Minutes Intravenous 3 times per day 11/22/15 1516 11/22/15 2258   11/22/15 2030  vancomycin (VANCOCIN) 1,500 mg in sodium chloride 0.9 % 500 mL IVPB     1,500 mg 250 mL/hr over 120 Minutes Intravenous  Once 11/22/15 2006 11/22/15 2312   11/22/15 1530  vancomycin (VANCOCIN) 1,500 mg in sodium chloride 0.9 % 500 mL IVPB  Status:  Discontinued     1,500 mg 250 mL/hr over 120 Minutes Intravenous  Once 11/22/15 1515 11/22/15 2006   11/22/15 1530  piperacillin-tazobactam (ZOSYN) IVPB 3.375 g  Status:  Discontinued     3.375 g 100 mL/hr over 30 Minutes Intravenous  Once 11/22/15 1515 11/22/15 1523       V. Leia Alf, M.D. [facility] [place] Office: [phone] Pager:  [phone]

## 2015-11-30 LAB — GLUCOSE, CAPILLARY: GLUCOSE-CAPILLARY: 126 mg/dL — AB (ref 65–99)

## 2015-12-05 ENCOUNTER — Encounter: Payer: Self-pay | Admitting: Surgery

## 2015-12-05 ENCOUNTER — Other Ambulatory Visit: Payer: Self-pay

## 2015-12-09 ENCOUNTER — Encounter: Payer: Self-pay | Admitting: Surgery

## 2015-12-09 ENCOUNTER — Ambulatory Visit (INDEPENDENT_AMBULATORY_CARE_PROVIDER_SITE_OTHER): Payer: Medicare Other | Admitting: Surgery

## 2015-12-09 VITALS — BP 127/52 | HR 80 | Temp 97.7°F | Resp 18 | Ht 62.0 in | Wt 137.0 lb

## 2015-12-09 DIAGNOSIS — I7025 Atherosclerosis of native arteries of other extremities with ulceration: Secondary | ICD-10-CM

## 2015-12-09 NOTE — Progress Notes (Signed)
Patient name: Jessica Abbott MRN: [id number] DOB: 07/28/1941 Sex: female     Chief Complaint  Patient presents with  . PVD    1 week f/u    HISTORY OF PRESENT ILLNESS: This is a 75 year old female that I initially saw as a second opinion for right leg ischemia.  She had previously undergone a left femoral to dorsalis pedis bypass graft and was sent [facility] for limb salvage.  She had developed a new wound on her right foot.  Angiography revealed superficial femoral-popliteal artery occlusion with single vessel runoff via the peroneal artery.  She was offered an above-knee amputation and at [facility], but wanted to see if I would consider a bypass.  She was tentatively placed on the schedule for a femoral peroneal bypass and transmetatarsal amputation.  In the interim, she was admitted to the hospital with a GI bleed with a hemoglobin of 3.5.  Her foot has deteriorated and prior to discharge I had recommended a right above-knee amputation.  She is here today for further discussions  Past Medical History  Diagnosis Date  . Hypertension   . Peripheral vascular disease ([place])   . Diabetes mellitus without complication ([place])   . Arthritis   . Peripheral arterial disease ([place])   . Chronic kidney disease   . SOB (shortness of breath)     Past Surgical History  Procedure Laterality Date  . Back surgery  2002  . Abdominal hysterectomy    . Femoral artery arteriogram  04/20/2015  . Esophagogastroduodenoscopy (egd) with propofol N/A 11/23/2015    Procedure: ESOPHAGOGASTRODUODENOSCOPY (EGD) WITH PROPOFOL;  Surgeon: Ronald Lobo, MD;  Location: [place];  Service: Endoscopy;  Laterality: N/A;  . Colonoscopy N/A 11/24/2015    Procedure: COLONOSCOPY;  Surgeon: Ronald Lobo, MD;  [facility]COPY;  Service: Endoscopy;  Laterality: N/A;  . Givens capsule study N/A 11/26/2015    Procedure: GIVENS CAPSULE STUDY;  Surgeon: Ronald Lobo, MD;  Location: [place];  Service: Endoscopy;   Laterality: N/A;    Social History   Social History  . Marital Status: Married    Spouse Name: N/A  . Number of Children: N/A  . Years of Education: N/A   Occupational History  . Not on file.   Social History Main Topics  . Smoking status: Former Smoker    Quit date: 10/27/2013  . Smokeless tobacco: Never Used  . Alcohol Use: No  . Drug Use: No  . Sexual Activity: Not on file   Other Topics Concern  . Not on file   Social History Narrative    Family History  Problem Relation Age of Onset  . Heart disease Father     Allergies as of 12/09/2015  . (No Known Allergies)    Current Outpatient Prescriptions on File Prior to Visit  Medication Sig Dispense Refill  . acetaminophen (TYLENOL) 500 MG tablet Take 500 mg by mouth every 6 (six) hours as needed (pain).    Marland Kitchen amLODipine (NORVASC) 10 MG tablet Take 10 mg by mouth daily.    Marland Kitchen aspirin EC 81 MG tablet Take 81 mg by mouth daily.     . baclofen (LIORESAL) 10 MG tablet Take 0.5 tablets (5 mg total) by mouth 2 (two) times daily. 30 each 0  . Cholecalciferol (VITAMIN D3) 2000 units capsule Take 2,000 Units by mouth daily.     Marland Kitchen docusate sodium (COLACE) 100 MG capsule Take 100 mg by mouth daily as needed (constipation).    Marland Kitchen  ferrous sulfate 325 (65 FE) MG tablet Take 325 mg by mouth daily with breakfast.    . HYDROcodone-acetaminophen (NORCO/VICODIN) 5-325 MG tablet Take 1 tablet by mouth every 6 (six) hours as needed (pain). Reported on 10/28/2015    . lisinopril (PRINIVIL,ZESTRIL) 10 MG tablet Take 10 mg by mouth daily.    . metFORMIN (GLUCOPHAGE) 500 MG tablet Take 500 mg by mouth daily with breakfast.    . pantoprazole (PROTONIX) 40 MG tablet Take 1 tablet (40 mg total) by mouth 2 (two) times daily. 60 tablet 0  . albuterol (PROVENTIL HFA;VENTOLIN HFA) 108 (90 Base) MCG/ACT inhaler Inhale 2 puffs into the lungs every 6 (six) hours as needed for wheezing or shortness of breath.    . feeding supplement, GLUCERNA SHAKE,  (GLUCERNA SHAKE) LIQD Take 237 mLs by mouth 3 (three) times daily between meals. (Patient not taking: Reported on 12/09/2015) 20 Can 0   No current facility-administered medications on file prior to visit.     REVIEW OF SYSTEMS: Cardiovascular: No chest pain, chest pressure, palpitations, orthopnea, or dyspnea on exertion. No claudication or rest pain,  No history of DVT or phlebitis. Pulmonary: No productive cough, asthma or wheezing. Neurologic: No weakness, paresthesias, aphasia, or amaurosis. No dizziness. Hematologic: No bleeding problems or clotting disorders. Musculoskeletal: No joint pain or joint swelling. Gastrointestinal: No blood in stool or hematemesis Genitourinary: No dysuria or hematuria. Psychiatric:: No history of major depression. Integumentary: Right foot wound Constitutional: No fever or chills.  PHYSICAL EXAMINATION:   Vital signs are  Filed Vitals:   12/09/15 1419  BP: 127/52  Pulse: 80  Temp: 97.7 F (36.5 C)  Resp: 18  Height: 5\' 2"  (1.575 m)  Weight: 137 lb (62.143 kg)  SpO2: 98%   Body mass index is 25.05 kg/(m^2). General: The patient appears their stated age. HEENT:  No gross abnormalities Pulmonary:  Non labored breathing Musculoskeletal: There are no major deformities. Neurologic: No focal weakness or paresthesias are detected, Skin: Ischemic changes to right forefoot extending up on to the dorsum with necrosis of the toes no active infection Psychiatric: The patient has normal affect. Cardiovascular: There is a regular rate and rhythm without significant murmur appreciated.   Diagnostic Studies None  Assessment: Right leg/foot ischemia Plan: I discussed again proceeding with a right above-knee amputation.  We discussed the details of the operation as well as the recovery.  She is currently off her Coumadin which was initiated after her stem distal bypass.  She is likely not to restart this.  I would consider starting her on Plavix in  addition to her aspirin after her amputation.  This is been scheduled for April.  All of her questions were answered today.  Eldridge Abrahams, M.D. [facility] [place] Office: [phone] Pager:  [phone]

## 2015-12-21 ENCOUNTER — Encounter (HOSPITAL_COMMUNITY)
Admission: RE | Admit: 2015-12-21 | Discharge: 2015-12-21 | Disposition: A | Discharge status: Home / Self Care | Payer: Medicare Other | Source: Ambulatory Visit | Attending: Surgery | Admitting: Surgery

## 2015-12-21 ENCOUNTER — Encounter (HOSPITAL_COMMUNITY): Payer: Self-pay

## 2015-12-21 HISTORY — DX: Personal history of pneumonia (recurrent): Z87.01

## 2015-12-21 HISTORY — DX: Pulmonary hypertension, unspecified: I27.20

## 2015-12-21 HISTORY — DX: Personal history of other diseases of the respiratory system: Z87.09

## 2015-12-21 HISTORY — DX: Nonrheumatic aortic (valve) insufficiency: I35.1

## 2015-12-21 HISTORY — DX: Personal history of other medical treatment: Z92.89

## 2015-12-21 HISTORY — DX: Anemia, unspecified: D64.9

## 2015-12-21 HISTORY — DX: Stress incontinence (female) (male): N39.3

## 2015-12-21 LAB — SURGICAL PCR SCREEN
MRSA, PCR: POSITIVE — AB
STAPHYLOCOCCUS AUREUS: POSITIVE — AB

## 2015-12-21 LAB — COMPREHENSIVE METABOLIC PANEL
ALBUMIN: 2.6 g/dL — AB (ref 3.5–5.0)
ALK PHOS: 57 U/L (ref 38–126)
ALT: 10 U/L — AB (ref 14–54)
AST: 15 U/L (ref 15–41)
Anion gap: 10 (ref 5–15)
BUN: 10 mg/dL (ref 6–20)
CALCIUM: 9.2 mg/dL (ref 8.9–10.3)
CHLORIDE: 104 mmol/L (ref 101–111)
CO2: 24 mmol/L (ref 22–32)
CREATININE: 0.63 mg/dL (ref 0.44–1.00)
GFR calc Af Amer: >60 mL/min (ref >60)
GFR calc non Af Amer: >60 mL/min (ref >60)
Glucose, Bld: 117 mg/dL — ABNORMAL HIGH (ref 65–99)
Potassium: 4.1 mmol/L (ref 3.5–5.1)
SODIUM: 138 mmol/L (ref 135–145)
Total Bilirubin: 0.3 mg/dL (ref 0.3–1.2)
Total Protein: 8.3 g/dL — ABNORMAL HIGH (ref 6.5–8.1)

## 2015-12-21 LAB — CBC
HCT: 27.5 % — ABNORMAL LOW (ref 36.0–46.0)
HEMOGLOBIN: 8.5 g/dL — AB (ref 12.0–15.0)
MCH: 22.7 pg — ABNORMAL LOW (ref 26.0–34.0)
MCHC: 30.9 g/dL (ref 30.0–36.0)
MCV: 73.3 fL — ABNORMAL LOW (ref 78.0–100.0)
Platelets: 542 10*3/uL — ABNORMAL HIGH (ref 150–400)
RBC: 3.75 MIL/uL — AB (ref 3.87–5.11)
RDW: 22.6 % — ABNORMAL HIGH (ref 11.5–15.5)
WBC: 11 10*3/uL — ABNORMAL HIGH (ref 4.0–10.5)

## 2015-12-21 LAB — GLUCOSE, CAPILLARY: Glucose-Capillary: 120 mg/dL — ABNORMAL HIGH (ref 65–99)

## 2015-12-21 MED ORDER — CHLORHEXIDINE GLUCONATE CLOTH 2 % EX PADS: 6.0000 | MEDICATED_PAD | Freq: Once | CUTANEOUS | Status: DC

## 2015-12-21 NOTE — Progress Notes (Signed)
Mupirocin Ointment Rx called into [facility] on [address] in [facility] for positive PCR of MRSA and Staph. Spoke with pt's daughter and informed her of need to pick up Rx. She voiced understanding.

## 2015-12-21 NOTE — Pre-Procedure Instructions (Signed)
Zandrea Oles  12/21/2015      [facility] [place] [address] [place] [address] Phone: [phone] Fax: [phone]    Your procedure is scheduled on Friday, April 7th, 2017.  Report to [facility] Admitting at 7:30 A.M.  Call this number if you have problems the morning of surgery:  [phone]   Remember:  Do not eat food or drink liquids after midnight.   Take these medicines the morning of surgery with A SIP OF WATER: Acetaminophen (Tylenol) if needed, Albuterol inhaler if needed (please bring with you), Amlodipine (Norvasc), Baclofen (Lioresal), Hydrocodone-acetaminophen (Norco) if needed, Pantoprazole (Protonix).  DO NOT Take Metformin the day of surgery.   Stop taking: Aspirin, NSAIDS, Aleve, Naproxen, Ibuprofen, Advil, Motrin, BC's, Goody's, Fish oil, all herbal medications, and all vitamins.    Do not wear jewelry, make-up or nail polish.  Do not wear lotions, powders, or perfumes.    Do not shave 48 hours prior to surgery.    Do not bring valuables to the hospital.   [facility] is not responsible for any belongings or valuables.  Contacts, dentures or bridgework may not be worn into surgery.  Leave your suitcase in the car.  After surgery it may be brought to your room.  For patients admitted to the hospital, discharge time will be determined by your treatment team.  Patients discharged the day of surgery will not be allowed to drive home.   Special instructions:  See attached.   Please read over the following fact sheets that you were given. Pain Booklet, Coughing and Deep Breathing, MRSA Information and Surgical Site Infection Prevention      How to Manage Your Diabetes Before and After Surgery  Why is it important to control my blood sugar before and after surgery? . Improving blood sugar levels before and after surgery helps healing and can limit problems. . A way of  improving blood sugar control is eating a healthy diet by: o  Eating less sugar and carbohydrates o  Increasing activity/exercise o  Talking with your doctor about reaching your blood sugar goals . High blood sugars (greater than 180 mg/dL) can raise your risk of infections and slow your recovery, so you will need to focus on controlling your diabetes during the weeks before surgery. . Make sure that the doctor who takes care of your diabetes knows about your planned surgery including the date and location.  How do I manage my blood sugar before surgery? . Check your blood sugar at least 4 times a day, starting 2 days before surgery, to make sure that the level is not too high or low. o Check your blood sugar the morning of your surgery when you wake up and every 2 hours until you get to the Short Stay unit. . If your blood sugar is less than 70 mg/dL, you will need to treat for low blood sugar: o Do not take insulin. o Treat a low blood sugar (less than 70 mg/dL) with  cup of clear juice (cranberry or apple), 4 glucose tablets, OR glucose gel. o Recheck blood sugar in 15 minutes after treatment (to make sure it is greater than 70 mg/dL). If your blood sugar is not greater than 70 mg/dL on recheck, call [phone] for further instructions. . Report your blood sugar to the short stay nurse when you get to Short Stay.  . If you are admitted  to the hospital after surgery: o Your blood sugar will be checked by the staff and you will probably be given insulin after surgery (instead of oral diabetes medicines) to make sure you have good blood sugar levels. o The goal for blood sugar control after surgery is 80-180 mg/dL.   WHAT DO I DO ABOUT MY DIABETES MEDICATION?   Marland Kitchen Do not take oral diabetes medicines (pills) the morning of surgery.

## 2015-12-21 NOTE — Progress Notes (Signed)
PCP - Dr. Doug Sou Cardiologist - denies  EKG - 11/24/15 CXR - denies  Echo- 11/25/15 Stress test/Cardiac Cath - denies  Patient denies chest pain and shortness of breath at PAT appointment.

## 2015-12-22 ENCOUNTER — Other Ambulatory Visit: Payer: Self-pay

## 2015-12-22 ENCOUNTER — Encounter (HOSPITAL_COMMUNITY): Payer: Self-pay

## 2015-12-22 LAB — HEMOGLOBIN A1C
HEMOGLOBIN A1C: 6.3 % — AB (ref 4.8–5.6)
MEAN PLASMA GLUCOSE: 134 mg/dL

## 2015-12-22 MED ORDER — DEXTROSE 5 % IV SOLN
1.5000 g | INTRAVENOUS | Status: AC
  Administered 2015-12-23: 1.5 g via INTRAVENOUS
  Filled 2015-12-22: qty 1.5

## 2015-12-22 MED ORDER — SODIUM CHLORIDE 0.9 % IV SOLN: INTRAVENOUS | Status: DC

## 2015-12-22 NOTE — Progress Notes (Signed)
Anesthesia Chart Review: Patient is a 75 year old female scheduled for right AKA on 12/23/15 by Dr. Scot Dock (was previously scheduled under Dr. Trula Slade).  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 11/25/15. Hospitalized 11/22/15-11/29/15 at [facility] for right foot gangrene and GI bleed (hgb 3.3). EGD/colonscopy/capsule was non-diagnositic for source of bleeding. She received 4 Units PRBCs and 2 FFP. Warfarin was held (which had been started 04/2015 for LLE graft patency). 11/25/15 RBC scan was negative. Echo during that admission showed moderate AR/TR and severe pulmonary hypertension. PCP is listed as Dr. Marlou Starks [facility], see Care Everywhere). Her 12/07/15 hospital follow-up note says that she is normally followed by Dr. Lajoyce Corners.   PAT Vitals: BP 138/43, HR 70, RR 18, T 36.8C, O2 sat 100%. CBG 120.  Meds include albuterol, amlodipine, ASA, baclofen, 65 Fe, Norco, lisinopril, metformin, Protonix.  11/22/15 EKG: SR, borderline ST depression anterolateral leads. No significant change since last tracing (12/07/99).    11/25/15 Echo: Study Conclusions - Left ventricle: The cavity size was normal. Systolic function was  normal. The estimated ejection fraction was in the range of 60%  to 65%. Wall motion was normal; there were no regional wall  motion abnormalities. Doppler parameters are consistent with  abnormal left ventricular relaxation (grade 1 diastolic  dysfunction). There was no evidence of elevated ventricular  filling pressure by Doppler parameters. - Aortic valve: There was moderate regurgitation. - Aortic root: The aortic root was normal in size. - Mitral valve: Structurally normal valve. There was mild  regurgitation. - Left atrium: The atrium was normal in size. - Right ventricle: The cavity size was normal. Wall thickness  was  normal. Systolic function was normal. - Tricuspid valve: There was moderate regurgitation. - Pulmonary arteries: The main pulmonary artery was normal-sized.  Systolic pressure was severely increased. PA peak pressure: 66 mm  Hg (S). - Inferior vena cava: The vessel was dilated. The respirophasic  diameter changes were blunted (< 50%), consistent with elevated  central venous pressure.  Preoperative labs noted. Cr 0.63. Glucose 117. WBC 11.0. H/H 8.5/27.5 (down from 8.9/29.6 on 11/29/15), PLT 542K. A1c 6.3. I will add a T&S and T&C order for the day of surgery, but will defer transfusion order to surgeon and/or anesthesiologist.  Above discussed with anesthesiologist Dr. Lissa Hoard. Patient tolerated GETA with [facility] last year, but with finding of severe pulmonary hypertension on recent echo her anesthesiologist may be inclined to recommend case be done under a regional anesthesia with no sedation. Defer decision to assigned anesthesiologist following evaluation. I have notified [facility] RN Colletta Maryland of H/H and Dr. Grayce Sessions input. I will also send Dr. Scot Dock a staff message.  Jessica Abbott [facility]/Anesthesiology Phone [phone] 12/22/2015 12:07 PM

## 2015-12-23 ENCOUNTER — Inpatient Hospital Stay (HOSPITAL_COMMUNITY): Payer: Medicare Other | Admitting: Vascular Surgery

## 2015-12-23 ENCOUNTER — Encounter (HOSPITAL_COMMUNITY): Payer: Self-pay | Admitting: Certified Registered Nurse Anesthetist

## 2015-12-23 ENCOUNTER — Encounter (HOSPITAL_COMMUNITY)
Admission: RE | Disposition: A | Discharge status: Other Acute Inpatient Hospital | Payer: Self-pay | Source: Ambulatory Visit | Attending: Vascular Surgery

## 2015-12-23 ENCOUNTER — Inpatient Hospital Stay (HOSPITAL_COMMUNITY)
Admission: RE | Admit: 2015-12-23 | Discharge: 2015-12-26 | DRG: 240 | Disposition: A | Discharge status: Rehabilitation Facility | Payer: Medicare Other | Source: Ambulatory Visit | Attending: Vascular Surgery | Admitting: Vascular Surgery

## 2015-12-23 DIAGNOSIS — I129 Hypertensive chronic kidney disease with stage 1 through stage 4 chronic kidney disease, or unspecified chronic kidney disease: Secondary | ICD-10-CM | POA: Diagnosis present

## 2015-12-23 DIAGNOSIS — R531 Weakness: Secondary | ICD-10-CM | POA: Diagnosis not present

## 2015-12-23 DIAGNOSIS — I272 Other secondary pulmonary hypertension: Secondary | ICD-10-CM | POA: Diagnosis present

## 2015-12-23 DIAGNOSIS — Z87891 Personal history of nicotine dependence: Secondary | ICD-10-CM

## 2015-12-23 DIAGNOSIS — N390 Urinary tract infection, site not specified: Secondary | ICD-10-CM | POA: Diagnosis not present

## 2015-12-23 DIAGNOSIS — I70261 Atherosclerosis of native arteries of extremities with gangrene, right leg: Secondary | ICD-10-CM | POA: Diagnosis present

## 2015-12-23 DIAGNOSIS — Z95828 Presence of other vascular implants and grafts: Secondary | ICD-10-CM

## 2015-12-23 DIAGNOSIS — L97519 Non-pressure chronic ulcer of other part of right foot with unspecified severity: Secondary | ICD-10-CM | POA: Diagnosis present

## 2015-12-23 DIAGNOSIS — E11621 Type 2 diabetes mellitus with foot ulcer: Secondary | ICD-10-CM | POA: Diagnosis present

## 2015-12-23 DIAGNOSIS — D649 Anemia, unspecified: Secondary | ICD-10-CM | POA: Diagnosis present

## 2015-12-23 DIAGNOSIS — G546 Phantom limb syndrome with pain: Secondary | ICD-10-CM | POA: Diagnosis not present

## 2015-12-23 DIAGNOSIS — M24561 Contracture, right knee: Secondary | ICD-10-CM | POA: Diagnosis present

## 2015-12-23 DIAGNOSIS — E118 Type 2 diabetes mellitus with unspecified complications: Secondary | ICD-10-CM | POA: Diagnosis not present

## 2015-12-23 DIAGNOSIS — R030 Elevated blood-pressure reading, without diagnosis of hypertension: Secondary | ICD-10-CM | POA: Diagnosis not present

## 2015-12-23 DIAGNOSIS — D638 Anemia in other chronic diseases classified elsewhere: Secondary | ICD-10-CM | POA: Diagnosis not present

## 2015-12-23 DIAGNOSIS — E1122 Type 2 diabetes mellitus with diabetic chronic kidney disease: Secondary | ICD-10-CM | POA: Diagnosis present

## 2015-12-23 DIAGNOSIS — E1152 Type 2 diabetes mellitus with diabetic peripheral angiopathy with gangrene: Secondary | ICD-10-CM | POA: Diagnosis present

## 2015-12-23 DIAGNOSIS — Z89611 Acquired absence of right leg above knee: Secondary | ICD-10-CM | POA: Diagnosis not present

## 2015-12-23 DIAGNOSIS — Z7984 Long term (current) use of oral hypoglycemic drugs: Secondary | ICD-10-CM | POA: Diagnosis not present

## 2015-12-23 DIAGNOSIS — R269 Unspecified abnormalities of gait and mobility: Secondary | ICD-10-CM | POA: Diagnosis not present

## 2015-12-23 DIAGNOSIS — N189 Chronic kidney disease, unspecified: Secondary | ICD-10-CM | POA: Diagnosis present

## 2015-12-23 DIAGNOSIS — E1151 Type 2 diabetes mellitus with diabetic peripheral angiopathy without gangrene: Secondary | ICD-10-CM | POA: Diagnosis present

## 2015-12-23 DIAGNOSIS — G8918 Other acute postprocedural pain: Secondary | ICD-10-CM | POA: Diagnosis not present

## 2015-12-23 DIAGNOSIS — I10 Essential (primary) hypertension: Secondary | ICD-10-CM | POA: Diagnosis not present

## 2015-12-23 HISTORY — PX: AMPUTATION: SHX166

## 2015-12-23 LAB — CBC
HCT: 23.6 % — ABNORMAL LOW (ref 36.0–46.0)
Hemoglobin: 7.1 g/dL — ABNORMAL LOW (ref 12.0–15.0)
MCH: 22.2 pg — AB (ref 26.0–34.0)
MCHC: 30.1 g/dL (ref 30.0–36.0)
MCV: 73.8 fL — ABNORMAL LOW (ref 78.0–100.0)
PLATELETS: 440 10*3/uL — AB (ref 150–400)
RBC: 3.2 MIL/uL — ABNORMAL LOW (ref 3.87–5.11)
RDW: 23 % — AB (ref 11.5–15.5)
WBC: 8.4 10*3/uL (ref 4.0–10.5)

## 2015-12-23 LAB — GLUCOSE, CAPILLARY
GLUCOSE-CAPILLARY: 103 mg/dL — AB (ref 65–99)
Glucose-Capillary: 104 mg/dL — ABNORMAL HIGH (ref 65–99)
Glucose-Capillary: 112 mg/dL — ABNORMAL HIGH (ref 65–99)
Glucose-Capillary: 111 mg/dL — ABNORMAL HIGH (ref 65–99)
Glucose-Capillary: 173 mg/dL — ABNORMAL HIGH (ref 65–99)

## 2015-12-23 LAB — CREATININE, SERUM
CREATININE: 0.57 mg/dL (ref 0.44–1.00)
GFR calc Af Amer: >60 mL/min (ref >60)
GFR calc non Af Amer: >60 mL/min (ref >60)

## 2015-12-23 LAB — PREPARE RBC (CROSSMATCH)

## 2015-12-23 SURGERY — AMPUTATION, ABOVE KNEE
Anesthesia: General | Site: Leg Upper | Laterality: Right

## 2015-12-23 MED ORDER — PHENYLEPHRINE 40 MCG/ML (10ML) SYRINGE FOR IV PUSH (FOR BLOOD PRESSURE SUPPORT)
PREFILLED_SYRINGE | INTRAVENOUS | Status: AC
  Filled 2015-12-23: qty 10

## 2015-12-23 MED ORDER — ASPIRIN EC 81 MG PO TBEC
81.0000 mg | DELAYED_RELEASE_TABLET | Freq: Every day | ORAL | Status: DC
  Administered 2015-12-23 – 2015-12-26 (×4): 81 mg via ORAL
  Filled 2015-12-23 (×4): qty 1

## 2015-12-23 MED ORDER — BACLOFEN 10 MG PO TABS
5.0000 mg | ORAL_TABLET | Freq: Two times a day (BID) | ORAL | Status: DC
  Administered 2015-12-23 – 2015-12-26 (×6): 5 mg via ORAL
  Filled 2015-12-23 (×6): qty 1

## 2015-12-23 MED ORDER — VITAMIN D3 25 MCG (1000 UNIT) PO TABS
2000.0000 [IU] | ORAL_TABLET | Freq: Every day | ORAL | Status: DC
  Administered 2015-12-23 – 2015-12-26 (×4): 2000 [IU] via ORAL
  Filled 2015-12-23 (×7): qty 2

## 2015-12-23 MED ORDER — FENTANYL CITRATE (PF) 100 MCG/2ML IJ SOLN
50.0000 ug | INTRAMUSCULAR | Status: DC | PRN
  Administered 2015-12-23 (×2): 50 ug via INTRAVENOUS

## 2015-12-23 MED ORDER — HYDROMORPHONE HCL 1 MG/ML IJ SOLN
0.5000 mg | INTRAMUSCULAR | Status: DC | PRN
  Administered 2015-12-23 – 2015-12-25 (×3): 1 mg via INTRAVENOUS
  Filled 2015-12-23 (×3): qty 1

## 2015-12-23 MED ORDER — DEXTROSE 5 % IV SOLN
1.5000 g | Freq: Two times a day (BID) | INTRAVENOUS | Status: AC
  Administered 2015-12-23 – 2015-12-24 (×2): 1.5 g via INTRAVENOUS
  Filled 2015-12-23 (×2): qty 1.5

## 2015-12-23 MED ORDER — DOCUSATE SODIUM 100 MG PO CAPS: 100.0000 mg | ORAL_CAPSULE | Freq: Every day | ORAL | Status: DC | PRN

## 2015-12-23 MED ORDER — CHLORHEXIDINE GLUCONATE CLOTH 2 % EX PADS: 6.0000 | MEDICATED_PAD | Freq: Once | CUTANEOUS | Status: DC

## 2015-12-23 MED ORDER — OXYCODONE HCL 5 MG/5ML PO SOLN
5.0000 mg | Freq: Once | ORAL | Status: DC | PRN
Start: 2015-12-23 — End: 2015-12-23

## 2015-12-23 MED ORDER — BOOST PO LIQD
237.0000 mL | Freq: Three times a day (TID) | ORAL | Status: DC
  Administered 2015-12-23 – 2015-12-26 (×8): 237 mL via ORAL
  Filled 2015-12-23 (×12): qty 237

## 2015-12-23 MED ORDER — FENTANYL CITRATE (PF) 100 MCG/2ML IJ SOLN
INTRAMUSCULAR | Status: AC
  Filled 2015-12-23: qty 2

## 2015-12-23 MED ORDER — ALBUTEROL SULFATE (2.5 MG/3ML) 0.083% IN NEBU: 2.5000 mg | INHALATION_SOLUTION | Freq: Four times a day (QID) | RESPIRATORY_TRACT | Status: DC | PRN

## 2015-12-23 MED ORDER — FENTANYL CITRATE (PF) 250 MCG/5ML IJ SOLN
INTRAMUSCULAR | Status: DC | PRN
  Administered 2015-12-23: 25 ug via INTRAVENOUS
  Administered 2015-12-23: 50 ug via INTRAVENOUS
  Administered 2015-12-23: 25 ug via INTRAVENOUS
  Administered 2015-12-23 (×2): 50 ug via INTRAVENOUS

## 2015-12-23 MED ORDER — EPHEDRINE SULFATE 50 MG/ML IJ SOLN
INTRAMUSCULAR | Status: DC | PRN
  Administered 2015-12-23 (×2): 5 mg via INTRAVENOUS

## 2015-12-23 MED ORDER — LACTATED RINGERS IV SOLN
INTRAVENOUS | Status: DC
Start: 2015-12-23 — End: 2015-12-26
  Administered 2015-12-23 (×3): via INTRAVENOUS

## 2015-12-23 MED ORDER — METFORMIN HCL 500 MG PO TABS
500.0000 mg | ORAL_TABLET | Freq: Every day | ORAL | Status: DC
  Administered 2015-12-24 – 2015-12-26 (×3): 500 mg via ORAL
  Filled 2015-12-23 (×3): qty 1

## 2015-12-23 MED ORDER — PHENOL 1.4 % MT LIQD: 1.0000 | OROMUCOSAL | Status: DC | PRN

## 2015-12-23 MED ORDER — BACITRACIN ZINC 500 UNIT/GM EX OINT
TOPICAL_OINTMENT | CUTANEOUS | Status: AC
  Filled 2015-12-23: qty 28.35

## 2015-12-23 MED ORDER — SODIUM CHLORIDE 0.9 % IV SOLN: INTRAVENOUS | Status: DC

## 2015-12-23 MED ORDER — 0.9 % SODIUM CHLORIDE (POUR BTL) OPTIME
TOPICAL | Status: DC | PRN
Start: 2015-12-23 — End: 2015-12-23
  Administered 2015-12-23: 1000 mL

## 2015-12-23 MED ORDER — LISINOPRIL 10 MG PO TABS
10.0000 mg | ORAL_TABLET | Freq: Every day | ORAL | Status: DC
  Administered 2015-12-24 – 2015-12-26 (×3): 10 mg via ORAL
  Filled 2015-12-23 (×3): qty 1

## 2015-12-23 MED ORDER — HYDROCODONE-ACETAMINOPHEN 5-325 MG PO TABS
1.0000 | ORAL_TABLET | Freq: Four times a day (QID) | ORAL | Status: DC | PRN
  Administered 2015-12-24: 1 via ORAL
  Filled 2015-12-23: qty 1

## 2015-12-23 MED ORDER — FENTANYL CITRATE (PF) 250 MCG/5ML IJ SOLN
INTRAMUSCULAR | Status: AC
  Filled 2015-12-23: qty 5

## 2015-12-23 MED ORDER — ACETAMINOPHEN 650 MG RE SUPP: 325.0000 mg | RECTAL | Status: DC | PRN

## 2015-12-23 MED ORDER — HYDRALAZINE HCL 20 MG/ML IJ SOLN: 5.0000 mg | INTRAMUSCULAR | Status: DC | PRN

## 2015-12-23 MED ORDER — FENTANYL CITRATE (PF) 100 MCG/2ML IJ SOLN
100.0000 ug | Freq: Once | INTRAMUSCULAR | Status: AC
Start: 2015-12-23 — End: 2015-12-23
  Administered 2015-12-23: 50 ug via INTRAVENOUS

## 2015-12-23 MED ORDER — FENTANYL CITRATE (PF) 100 MCG/2ML IJ SOLN
25.0000 ug | INTRAMUSCULAR | Status: DC | PRN
  Administered 2015-12-23: 25 ug via INTRAVENOUS

## 2015-12-23 MED ORDER — KCL IN DEXTROSE-NACL 20-5-0.45 MEQ/L-%-% IV SOLN
INTRAVENOUS | Status: DC
  Administered 2015-12-24: 02:00:00 via INTRAVENOUS
  Filled 2015-12-23: qty 1000

## 2015-12-23 MED ORDER — OXYCODONE-ACETAMINOPHEN 5-325 MG PO TABS
1.0000 | ORAL_TABLET | ORAL | Status: DC | PRN
  Administered 2015-12-23 – 2015-12-26 (×9): 2 via ORAL
  Filled 2015-12-23 (×9): qty 2

## 2015-12-23 MED ORDER — ONDANSETRON HCL 4 MG/2ML IJ SOLN: 4.0000 mg | Freq: Four times a day (QID) | INTRAMUSCULAR | Status: DC | PRN

## 2015-12-23 MED ORDER — LIDOCAINE HCL (CARDIAC) 20 MG/ML IV SOLN
INTRAVENOUS | Status: DC | PRN
  Administered 2015-12-23: 50 mg via INTRAVENOUS

## 2015-12-23 MED ORDER — AMLODIPINE BESYLATE 5 MG PO TABS
5.0000 mg | ORAL_TABLET | Freq: Every day | ORAL | Status: DC
  Administered 2015-12-24 – 2015-12-26 (×3): 5 mg via ORAL
  Filled 2015-12-23 (×3): qty 1

## 2015-12-23 MED ORDER — GUAIFENESIN-DM 100-10 MG/5ML PO SYRP: 15.0000 mL | ORAL_SOLUTION | ORAL | Status: DC | PRN

## 2015-12-23 MED ORDER — PANTOPRAZOLE SODIUM 40 MG PO TBEC: 40.0000 mg | DELAYED_RELEASE_TABLET | Freq: Every day | ORAL | Status: DC

## 2015-12-23 MED ORDER — FERROUS SULFATE 325 (65 FE) MG PO TABS
325.0000 mg | ORAL_TABLET | Freq: Every day | ORAL | Status: DC
  Administered 2015-12-24 – 2015-12-26 (×3): 325 mg via ORAL
  Filled 2015-12-23 (×3): qty 1

## 2015-12-23 MED ORDER — ENOXAPARIN SODIUM 40 MG/0.4ML ~~LOC~~ SOLN
40.0000 mg | SUBCUTANEOUS | Status: DC
  Administered 2015-12-24 – 2015-12-26 (×3): 40 mg via SUBCUTANEOUS
  Filled 2015-12-23 (×3): qty 0.4

## 2015-12-23 MED ORDER — PROPOFOL 10 MG/ML IV BOLUS
INTRAVENOUS | Status: DC | PRN
  Administered 2015-12-23: 120 mg via INTRAVENOUS

## 2015-12-23 MED ORDER — ALUM & MAG HYDROXIDE-SIMETH 200-200-20 MG/5ML PO SUSP: 15.0000 mL | ORAL | Status: DC | PRN

## 2015-12-23 MED ORDER — LABETALOL HCL 5 MG/ML IV SOLN
10.0000 mg | INTRAVENOUS | Status: DC | PRN
  Filled 2015-12-23: qty 4

## 2015-12-23 MED ORDER — OXYCODONE HCL 5 MG PO TABS: 5.0000 mg | ORAL_TABLET | Freq: Once | ORAL | Status: DC | PRN

## 2015-12-23 MED ORDER — LIDOCAINE HCL (CARDIAC) 20 MG/ML IV SOLN
INTRAVENOUS | Status: AC
  Filled 2015-12-23: qty 5

## 2015-12-23 MED ORDER — ONDANSETRON HCL 4 MG/2ML IJ SOLN
INTRAMUSCULAR | Status: DC | PRN
  Administered 2015-12-23: 4 mg via INTRAVENOUS

## 2015-12-23 MED ORDER — MAGNESIUM SULFATE 2 GM/50ML IV SOLN
2.0000 g | Freq: Once | INTRAVENOUS | Status: DC | PRN
  Filled 2015-12-23: qty 50

## 2015-12-23 MED ORDER — DOCUSATE SODIUM 100 MG PO CAPS
100.0000 mg | ORAL_CAPSULE | Freq: Every day | ORAL | Status: DC
  Administered 2015-12-24 – 2015-12-26 (×3): 100 mg via ORAL
  Filled 2015-12-23 (×3): qty 1

## 2015-12-23 MED ORDER — PANTOPRAZOLE SODIUM 40 MG PO TBEC
40.0000 mg | DELAYED_RELEASE_TABLET | Freq: Two times a day (BID) | ORAL | Status: DC
  Administered 2015-12-23 – 2015-12-26 (×6): 40 mg via ORAL
  Filled 2015-12-23 (×6): qty 1

## 2015-12-23 MED ORDER — METOPROLOL TARTRATE 1 MG/ML IV SOLN
2.0000 mg | INTRAVENOUS | Status: DC | PRN
Start: 2015-12-23 — End: 2015-12-26
  Filled 2015-12-23: qty 5

## 2015-12-23 MED ORDER — PROPOFOL 10 MG/ML IV BOLUS
INTRAVENOUS | Status: AC
  Filled 2015-12-23: qty 20

## 2015-12-23 MED ORDER — POTASSIUM CHLORIDE CRYS ER 20 MEQ PO TBCR: 20.0000 meq | EXTENDED_RELEASE_TABLET | Freq: Once | ORAL | Status: DC | PRN

## 2015-12-23 MED ORDER — ACETAMINOPHEN 500 MG PO TABS: 500.0000 mg | ORAL_TABLET | Freq: Four times a day (QID) | ORAL | Status: DC | PRN

## 2015-12-23 MED ORDER — ONDANSETRON HCL 4 MG/2ML IJ SOLN
INTRAMUSCULAR | Status: AC
  Filled 2015-12-23: qty 2

## 2015-12-23 MED ORDER — ACETAMINOPHEN 325 MG PO TABS
325.0000 mg | ORAL_TABLET | ORAL | Status: DC | PRN
  Administered 2015-12-24: 650 mg via ORAL
  Filled 2015-12-23: qty 2

## 2015-12-23 SURGICAL SUPPLY — 55 items
BANDAGE ACE 6X5 VEL STRL LF (GAUZE/BANDAGES/DRESSINGS) ×3 IMPLANT
BANDAGE ELASTIC 4 VELCRO ST LF (GAUZE/BANDAGES/DRESSINGS) ×3 IMPLANT
BANDAGE ELASTIC 6 VELCRO ST LF (GAUZE/BANDAGES/DRESSINGS) ×6 IMPLANT
BANDAGE ESMARK 6X9 LF (GAUZE/BANDAGES/DRESSINGS) IMPLANT
BLADE SAW RECIP 87.9 MT (BLADE) ×3 IMPLANT
BNDG CMPR 9X6 STRL LF SNTH (GAUZE/BANDAGES/DRESSINGS)
BNDG COHESIVE 6X5 TAN STRL LF (GAUZE/BANDAGES/DRESSINGS) ×3 IMPLANT
BNDG ESMARK 6X9 LF (GAUZE/BANDAGES/DRESSINGS)
BNDG GAUZE ELAST 4 BULKY (GAUZE/BANDAGES/DRESSINGS) ×3 IMPLANT
CANISTER SUCTION 2500CC (MISCELLANEOUS) ×3 IMPLANT
CLIP TI MEDIUM 6 (CLIP) IMPLANT
COVER SURGICAL LIGHT HANDLE (MISCELLANEOUS) ×3 IMPLANT
CUFF TOURNIQUET SINGLE 18IN (TOURNIQUET CUFF) IMPLANT
CUFF TOURNIQUET SINGLE 24IN (TOURNIQUET CUFF) IMPLANT
CUFF TOURNIQUET SINGLE 34IN LL (TOURNIQUET CUFF) IMPLANT
CUFF TOURNIQUET SINGLE 44IN (TOURNIQUET CUFF) IMPLANT
DRAIN CHANNEL 19F RND (DRAIN) IMPLANT
DRAPE ORTHO SPLIT 77X108 STRL (DRAPES) ×6
DRAPE PROXIMA HALF (DRAPES) ×3 IMPLANT
DRAPE SURG ORHT 6 SPLT 77X108 (DRAPES) ×2 IMPLANT
DRAPE U-SHAPE 47X51 STRL (DRAPES) ×3 IMPLANT
DRSG ADAPTIC 3X8 NADH LF (GAUZE/BANDAGES/DRESSINGS) ×3 IMPLANT
DRSG PAD ABDOMINAL 8X10 ST (GAUZE/BANDAGES/DRESSINGS) ×2 IMPLANT
ELECT REM PT RETURN 9FT ADLT (ELECTROSURGICAL) ×3
ELECTRODE REM PT RTRN 9FT ADLT (ELECTROSURGICAL) ×1 IMPLANT
EVACUATOR SILICONE 100CC (DRAIN) IMPLANT
GAUZE SPONGE 4X4 12PLY STRL (GAUZE/BANDAGES/DRESSINGS) ×3 IMPLANT
GLOVE BIO SURGEON STRL SZ7.5 (GLOVE) ×3 IMPLANT
GLOVE BIOGEL PI IND STRL 6.5 (GLOVE) ×3 IMPLANT
GLOVE BIOGEL PI IND STRL 8 (GLOVE) ×1 IMPLANT
GLOVE BIOGEL PI INDICATOR 6.5 (GLOVE) ×6
GLOVE BIOGEL PI INDICATOR 8 (GLOVE) ×2
GLOVE ECLIPSE 6.5 STRL STRAW (GLOVE) ×3 IMPLANT
GLOVE OPTIFIT SS 6.5 STRL BRWN (GLOVE) ×2 IMPLANT
GOWN STRL REUS W/ TWL LRG LVL3 (GOWN DISPOSABLE) ×3 IMPLANT
GOWN STRL REUS W/TWL LRG LVL3 (GOWN DISPOSABLE) ×9
KIT BASIN OR (CUSTOM PROCEDURE TRAY) ×3 IMPLANT
KIT ROOM TURNOVER OR (KITS) ×3 IMPLANT
NS IRRIG 1000ML POUR BTL (IV SOLUTION) ×3 IMPLANT
PACK GENERAL/GYN (CUSTOM PROCEDURE TRAY) ×3 IMPLANT
PAD ARMBOARD 7.5X6 YLW CONV (MISCELLANEOUS) ×6 IMPLANT
PADDING CAST COTTON 6X4 STRL (CAST SUPPLIES) IMPLANT
SPONGE GAUZE 4X4 12PLY STER LF (GAUZE/BANDAGES/DRESSINGS) ×2 IMPLANT
STAPLER VISISTAT (STAPLE) ×3 IMPLANT
STOCKINETTE IMPERVIOUS LG (DRAPES) ×3 IMPLANT
SUT ETHILON 3 0 PS 1 (SUTURE) IMPLANT
SUT SILK 0 TIES 10X30 (SUTURE) ×3 IMPLANT
SUT SILK 2 0 (SUTURE) ×3
SUT SILK 2 0 SH CR/8 (SUTURE) ×3 IMPLANT
SUT SILK 2-0 18XBRD TIE 12 (SUTURE) ×1 IMPLANT
SUT SILK 3 0 (SUTURE) ×3
SUT SILK 3-0 18XBRD TIE 12 (SUTURE) ×1 IMPLANT
SUT VIC AB 2-0 CT1 18 (SUTURE) ×3 IMPLANT
UNDERPAD 30X30 INCONTINENT (UNDERPADS AND DIAPERS) ×3 IMPLANT
WATER STERILE IRR 1000ML POUR (IV SOLUTION) IMPLANT

## 2015-12-23 NOTE — Op Note (Signed)
    NAME: Jessica Abbott    MRN: [id number] DOB: November 06, 1940    DATE OF OPERATION: 12/23/2015  PREOP DIAGNOSIS: Gangrene right leg  POSTOP DIAGNOSIS: Same  PROCEDURE: Right above-the-knee amputation  SURGEON: Judeth Cornfield. Scot Dock, MD, FACS  ASSIST: Delena Serve, RNFA  ANESTHESIA: Gen.   EBL: Minimal  INDICATIONS: [name] is a 75 y.o. female Who presented with gangrene of the right leg. 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  FINDINGS: The muscle was well perfused with no signs of infection above the knee.  TECHNIQUE: The patient was taken to the operating room and received a general anesthetic. The right lower extremity was prepped and draped in the usual sterile fashion. A fishmouth incision was marked above the level of the patella. A tourniquet was placed on the upper thigh. The leg was exsanguinated with an Esmarch bandage and the tourniquet inflated to 300 mmHg. A tourniquet control, the incision was carried down through the skin, subcutaneous tissue, fascia, muscle, to the femur which was dissected free circumferentially. The periosteum was elevated and the bone divided proximal to the level of skin division. The femoral artery and vein were individually suture ligated with 2-0 silk ties. The tourniquet was then released. Additional hemostasis was obtained using electrocautery and 2-0 silk ties. The edges of the bone were rasped. The wound was irrigated with copious amounts of saline. The fascial layer was closed with interrupted 2-0 Vicryl. The skin was closed with staples. Sterile dressing was applied. The patient tolerated the procedure well and was transferred to the recovery room in stable condition. All needle and sponge counts were correct.  [name], MD, FACS [facility]  DATE OF DICTATION:   12/23/2015

## 2015-12-23 NOTE — Anesthesia Postprocedure Evaluation (Signed)
Anesthesia Post Note  Patient: Jessica Abbott  Procedure(s) Performed: Procedure(s) (LRB): RIGHT  ABOVE KNEE AMPUTATION (Right)  Patient location during evaluation: PACU Anesthesia Type: General Level of consciousness: awake and alert Pain management: pain level controlled Vital Signs Assessment: post-procedure vital signs reviewed and stable Respiratory status: spontaneous breathing, nonlabored ventilation, respiratory function stable and patient connected to nasal cannula oxygen Cardiovascular status: blood pressure returned to baseline and stable Postop Assessment: no signs of nausea or vomiting Anesthetic complications: no    Last Vitals:  Filed Vitals:   12/23/15 1510 12/23/15 1533  BP: 122/55 127/46  Pulse: 79 77  Temp:  36.9 C  Resp: 13 16    Last Pain:  Filed Vitals:   12/23/15 1534  PainSc: Asleep                 [name]

## 2015-12-23 NOTE — Interval H&P Note (Signed)
History and Physical Interval Note:  12/23/2015 8:43 AM  Jessica Abbott  has presented today for surgery, with the diagnosis of Peripheral vascular disease with right toe ulcers I70.235  The various methods of treatment have been discussed with the patient and family. After consideration of risks, benefits and other options for treatment, the patient has consented to  Procedure(s): AMPUTATION ABOVE KNEE (Right) as a surgical intervention .  [name] history has been reviewed, patient examined, no change in status, stable for surgery.  I have reviewed the patient's chart and labs.  Questions were answered to the patient's satisfaction.     [name]

## 2015-12-23 NOTE — Anesthesia Preprocedure Evaluation (Signed)
Anesthesia Evaluation  Patient identified by MRN, date of birth, ID band Patient awake    Reviewed: Allergy & Precautions, NPO status , Patient's Chart, lab work & pertinent test results  Airway Mallampati: II   Neck ROM: full    Dental   Pulmonary shortness of breath, former smoker,    breath sounds clear to auscultation       Cardiovascular hypertension, + Peripheral Vascular Disease   Rhythm:regular Rate:Normal     Neuro/Psych    GI/Hepatic   Endo/Other  diabetes, Type 2  Renal/GU      Musculoskeletal  (+) Arthritis ,   Abdominal   Peds  Hematology   Anesthesia Other Findings   Reproductive/Obstetrics                             Anesthesia Physical Anesthesia Plan  ASA: III  Anesthesia Plan: General   Post-op Pain Management:    Induction: Intravenous  Airway Management Planned: LMA  Additional Equipment:   Intra-op Plan:   Post-operative Plan:   Informed Consent: I have reviewed the patients History and Physical, chart, labs and discussed the procedure including the risks, benefits and alternatives for the proposed anesthesia with the patient or authorized representative who has indicated his/her understanding and acceptance.     Plan Discussed with: CRNA, Anesthesiologist and Surgeon  Anesthesia Plan Comments:         Anesthesia Quick Evaluation

## 2015-12-23 NOTE — Anesthesia Procedure Notes (Signed)
Procedure Name: LMA Insertion Date/Time: 12/23/2015 1:32 PM Performed by: Rejeana Brock L Pre-anesthesia Checklist: Patient identified, Timeout performed, Emergency Drugs available, Suction available and Patient being monitored Patient Re-evaluated:Patient Re-evaluated prior to inductionOxygen Delivery Method: Circle system utilized Preoxygenation: Pre-oxygenation with 100% oxygen Intubation Type: IV induction Ventilation: Mask ventilation without difficulty LMA: LMA inserted LMA Size: 4.0 Number of attempts: 1 Placement Confirmation: positive ETCO2 and breath sounds checked- equal and bilateral Tube secured with: Tape Dental Injury: Teeth and Oropharynx as per pre-operative assessment

## 2015-12-23 NOTE — Transfer of Care (Signed)
Immediate Anesthesia Transfer of Care Note  Patient: Jessica Abbott  Procedure(s) Performed: Procedure(s): RIGHT  ABOVE KNEE AMPUTATION (Right)  Patient Location: PACU  Anesthesia Type:General  Level of Consciousness: awake  Airway & Oxygen Therapy: Patient Spontanous Breathing and Patient connected to nasal cannula oxygen  Post-op Assessment: Report given to RN and Post -op Vital signs reviewed and stable  Post vital signs: stable  Last Vitals:  Filed Vitals:   12/23/15 1230 12/23/15 1438  BP:  121/51  Pulse: 79 86  Temp:  36.2 C  Resp: 17 15    Complications: No apparent anesthesia complications

## 2015-12-23 NOTE — Progress Notes (Signed)
Utilization review completed.  

## 2015-12-23 NOTE — H&P (View-Only) (Signed)
Patient name: Jessica Abbott MRN: [id number] DOB: [birth year] Sex: female     Chief Complaint  Patient presents with  . PVD    1 week f/u    HISTORY OF PRESENT ILLNESS: This is a 75 year old female that I initially saw as a second opinion for right leg ischemia.  She had previously undergone a left femoral to dorsalis pedis bypass graft and was sent [facility] for limb salvage.  She had developed a new wound on her right foot.  Angiography revealed superficial femoral-popliteal artery occlusion with single vessel runoff via the peroneal artery.  She was offered an above-knee amputation and at [facility], but wanted to see if I would consider a bypass.  She was tentatively placed on the schedule for a femoral peroneal bypass and transmetatarsal amputation.  In the interim, she was admitted to the hospital with a GI bleed with a hemoglobin of 3.5.  Her foot has deteriorated and prior to discharge I had recommended a right above-knee amputation.  She is here today for further discussions  Past Medical History  Diagnosis Date  . Hypertension   . Peripheral vascular disease ([place])   . Diabetes mellitus without complication ([place])   . Arthritis   . Peripheral arterial disease ([place])   . Chronic kidney disease   . SOB (shortness of breath)     Past Surgical History  Procedure Laterality Date  . Back surgery  2002  . Abdominal hysterectomy    . Femoral artery arteriogram  04/20/2015  . Esophagogastroduodenoscopy (egd) with propofol N/A 11/23/2015    Procedure: ESOPHAGOGASTRODUODENOSCOPY (EGD) WITH PROPOFOL;  Surgeon: Ronald Lobo, MD;  Location: [place];  Service: Endoscopy;  Laterality: N/A;  . Colonoscopy N/A 11/24/2015    Procedure: COLONOSCOPY;  Surgeon: Ronald Lobo, MD;  Location: [facility] ENDOSCOPY;  Service: Endoscopy;  Laterality: N/A;  . Givens capsule study N/A 11/26/2015    Procedure: GIVENS CAPSULE STUDY;  Surgeon: Ronald Lobo, MD;  Location: [place];  Service: Endoscopy;   Laterality: N/A;    Social History   Social History  . Marital Status: Married    Spouse Name: N/A  . Number of Children: N/A  . Years of Education: N/A   Occupational History  . Not on file.   Social History Main Topics  . Smoking status: Former Smoker    Quit date: 10/27/2013  . Smokeless tobacco: Never Used  . Alcohol Use: No  . Drug Use: No  . Sexual Activity: Not on file   Other Topics Concern  . Not on file   Social History Narrative    Family History  Problem Relation Age of Onset  . Heart disease Father     Allergies as of 12/09/2015  . (No Known Allergies)    Current Outpatient Prescriptions on File Prior to Visit  Medication Sig Dispense Refill  . acetaminophen (TYLENOL) 500 MG tablet Take 500 mg by mouth every 6 (six) hours as needed (pain).    Marland Kitchen amLODipine (NORVASC) 10 MG tablet Take 10 mg by mouth daily.    Marland Kitchen aspirin EC 81 MG tablet Take 81 mg by mouth daily.     . baclofen (LIORESAL) 10 MG tablet Take 0.5 tablets (5 mg total) by mouth 2 (two) times daily. 30 each 0  . Cholecalciferol (VITAMIN D3) 2000 units capsule Take 2,000 Units by mouth daily.     Marland Kitchen docusate sodium (COLACE) 100 MG capsule Take 100 mg by mouth daily as needed (constipation).    Marland Kitchen  ferrous sulfate 325 (65 FE) MG tablet Take 325 mg by mouth daily with breakfast.    . HYDROcodone-acetaminophen (NORCO/VICODIN) 5-325 MG tablet Take 1 tablet by mouth every 6 (six) hours as needed (pain). Reported on 10/28/2015    . lisinopril (PRINIVIL,ZESTRIL) 10 MG tablet Take 10 mg by mouth daily.    . metFORMIN (GLUCOPHAGE) 500 MG tablet Take 500 mg by mouth daily with breakfast.    . pantoprazole (PROTONIX) 40 MG tablet Take 1 tablet (40 mg total) by mouth 2 (two) times daily. 60 tablet 0  . albuterol (PROVENTIL HFA;VENTOLIN HFA) 108 (90 Base) MCG/ACT inhaler Inhale 2 puffs into the lungs every 6 (six) hours as needed for wheezing or shortness of breath.    . feeding supplement, GLUCERNA SHAKE,  (GLUCERNA SHAKE) LIQD Take 237 mLs by mouth 3 (three) times daily between meals. (Patient not taking: Reported on 12/09/2015) 20 Can 0   No current facility-administered medications on file prior to visit.     REVIEW OF SYSTEMS: Cardiovascular: No chest pain, chest pressure, palpitations, orthopnea, or dyspnea on exertion. No claudication or rest pain,  No history of DVT or phlebitis. Pulmonary: No productive cough, asthma or wheezing. Neurologic: No weakness, paresthesias, aphasia, or amaurosis. No dizziness. Hematologic: No bleeding problems or clotting disorders. Musculoskeletal: No joint pain or joint swelling. Gastrointestinal: No blood in stool or hematemesis Genitourinary: No dysuria or hematuria. Psychiatric:: No history of major depression. Integumentary: Right foot wound Constitutional: No fever or chills.  PHYSICAL EXAMINATION:   Vital signs are  Filed Vitals:   12/09/15 1419  BP: 127/52  Pulse: 80  Temp: 97.7 F (36.5 C)  Resp: 18  Height: 5\' 2"  (1.575 m)  Weight: 137 lb (62.143 kg)  SpO2: 98%   Body mass index is 25.05 kg/(m^2). General: The patient appears their stated age. HEENT:  No gross abnormalities Pulmonary:  Non labored breathing Musculoskeletal: There are no major deformities. Neurologic: No focal weakness or paresthesias are detected, Skin: Ischemic changes to right forefoot extending up on to the dorsum with necrosis of the toes no active infection Psychiatric: The patient has normal affect. Cardiovascular: There is a regular rate and rhythm without significant murmur appreciated.   Diagnostic Studies None  Assessment: Right leg/foot ischemia Plan: I discussed again proceeding with a right above-knee amputation.  We discussed the details of the operation as well as the recovery.  She is currently off her Coumadin which was initiated after her stem distal bypass.  She is likely not to restart this.  I would consider starting her on Plavix in  addition to her aspirin after her amputation.  This is been scheduled for April.  All of her questions were answered today.  Eldridge Abrahams, M.D. [facility] Office: [phone] Pager:  [phone]

## 2015-12-23 NOTE — Progress Notes (Signed)
Pt having constant pain in right foot.  Dr Marcie Bal notified and orders received and given.  Temporary relief with each dose. VSS.

## 2015-12-24 LAB — BASIC METABOLIC PANEL
ANION GAP: 9 (ref 5–15)
BUN: 10 mg/dL (ref 6–20)
CALCIUM: 8.4 mg/dL — AB (ref 8.9–10.3)
CO2: 24 mmol/L (ref 22–32)
Chloride: 104 mmol/L (ref 101–111)
Creatinine, Ser: 0.56 mg/dL (ref 0.44–1.00)
GFR calc Af Amer: >60 mL/min (ref >60)
GFR calc non Af Amer: >60 mL/min (ref >60)
GLUCOSE: 196 mg/dL — AB (ref 65–99)
Potassium: 4.2 mmol/L (ref 3.5–5.1)
Sodium: 137 mmol/L (ref 135–145)

## 2015-12-24 LAB — CBC
HEMATOCRIT: 23.2 % — AB (ref 36.0–46.0)
Hemoglobin: 7.1 g/dL — ABNORMAL LOW (ref 12.0–15.0)
MCH: 22.9 pg — AB (ref 26.0–34.0)
MCHC: 30.6 g/dL (ref 30.0–36.0)
MCV: 74.8 fL — AB (ref 78.0–100.0)
Platelets: 480 10*3/uL — ABNORMAL HIGH (ref 150–400)
RBC: 3.1 MIL/uL — ABNORMAL LOW (ref 3.87–5.11)
RDW: 23.1 % — AB (ref 11.5–15.5)
WBC: 9.4 10*3/uL (ref 4.0–10.5)

## 2015-12-24 LAB — GLUCOSE, CAPILLARY: Glucose-Capillary: 218 mg/dL — ABNORMAL HIGH (ref 65–99)

## 2015-12-24 NOTE — Evaluation (Signed)
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 PMH: HTN, PVD, DM II, CKD, and arthritis.  Clinical Impression  Patient is s/p above surgery resulting in functional limitations due to the deficits listed below (see PT Problem List). Pt was indep PTA with use of RW. Anticipate pt can achieve safe supervision, w/c level of function upon complete of CIR stay and would be safe to transition home with children. Patient will benefit from skilled PT to increase their independence and safety with mobility to allow discharge to the venue listed below.       Follow Up Recommendations CIR    Equipment Recommendations  Wheelchair (measurements PT);Wheelchair cushion (measurements PT);Rolling walker with 5" wheels    Recommendations for Other Services Rehab consult     Precautions / Restrictions Precautions Precautions: Fall Precaution Comments: new R AKA Restrictions Weight Bearing Restrictions: Yes RLE Weight Bearing: Non weight bearing      Mobility  Bed Mobility Overal bed mobility: Needs Assistance Bed Mobility: Supine to Sit     Supine to sit: Mod assist     General bed mobility comments: v/c's for long sit technique, modA to bring hips to EOB  Transfers Overall transfer level: Needs assistance Equipment used: Rolling walker (2 wheeled) Transfers: Sit to/from [facility] Sit to Stand: Mod assist;+2 safety/equipment Stand pivot transfers: Min assist;+2 safety/equipment;+2 physical assistance       General transfer comment: minA for walker management, pt able to complete 5 hops on L LE with minA to steady pt and guide hips to chair. max v/c's to reach back for chair. modA to steady pt during transition of hands from walker to chair. pt with minimal foot clearance  Ambulation/Gait             General  Gait Details: pt unable to tolerate at this time  Stairs            Wheelchair Mobility    Modified Rankin (Stroke Patients Only)       Balance Overall balance assessment: Needs assistance Sitting-balance support: Feet supported;Bilateral upper extremity supported Sitting balance-Leahy Scale: Fair Sitting balance - Comments: pt required min guard to maintain EOB balance due to not having R LE adn finding new genter of gravity   Standing balance support: No upper extremity supported;During functional activity Standing balance-Leahy Scale: Poor Standing balance comment: pt requires RW for safe standing                             Pertinent Vitals/Pain Pain Assessment: 0-10 Pain Score: 3  Pain Location: R residual limb, phantom limb pain Pain Intervention(s): Monitored during session    Home Living Family/patient expects to be discharged to:: Inpatient rehab Living Arrangements: Children               Additional Comments: pt lives in a home with her children in which someone is always there. Pt was using a RW PTA due to R LE pain    Prior Function Level of Independence: Independent with assistive device(s)         Comments: pt was indep PTA but needed RW due to R LE pain     Hand Dominance   Dominant Hand: Right    Extremity/Trunk Assessment   Upper Extremity Assessment: Generalized weakness  Lower Extremity Assessment: RLE deficits/detail;LLE deficits/detail RLE Deficits / Details: AKA LLE Deficits / Details: generalized weakness  Cervical / Trunk Assessment: Normal  Communication   Communication: No difficulties  Cognition Arousal/Alertness: Awake/alert Behavior During Therapy: WFL for tasks assessed/performed (pt tearful regarding recent loss of limb) Overall Cognitive Status: Within Functional Limits for tasks assessed                      General Comments General comments (skin integrity, edema, etc.): pt  very emotional this date regarding ampulation    Exercises Amputee Exercises Gluteal Sets: AROM;Both;5 reps;Seated Hip Flexion/Marching: AROM;Right;5 reps;Seated      Assessment/Plan    PT Assessment Patient needs continued PT services  PT Diagnosis Difficulty walking;Generalized weakness   PT Problem List Decreased strength;Decreased activity tolerance;Decreased balance;Decreased mobility;Decreased knowledge of use of DME  PT Treatment Interventions DME instruction;Gait training;Functional mobility training;Therapeutic activities;Therapeutic exercise;Balance training;Wheelchair mobility training   PT Goals (Current goals can be found in the Care Plan section) Acute Rehab PT Goals Patient Stated Goal: get home PT Goal Formulation: With patient Time For Goal Achievement: 12/31/15 Potential to Achieve Goals: Good    Frequency Min 4X/week   Barriers to discharge        Co-evaluation               End of Session Equipment Utilized During Treatment: Gait belt Activity Tolerance: Patient tolerated treatment well Patient left: in chair;with call bell/phone within reach Nurse Communication: Mobility status         Time: [id number] PT Time Calculation (min) (ACUTE ONLY): 23 min   Charges:   PT Evaluation $PT Eval Moderate Complexity: 1 Procedure PT Treatments $Therapeutic Activity: 8-22 mins   PT G CodesKingsley Callander 12/24/2015, 2:32 PM   Kittie Plater, PT, DPT Pager #: [phone] Office #: [phone]

## 2015-12-24 NOTE — Progress Notes (Signed)
   VASCULAR SURGERY ASSESSMENT & PLAN:  * 1 Day Post-Op s/p: Right AKA  *  Dressing change tomorrow.  * Anemia: Hgb = 7.1. Asymptomatic. Guiac stool. Was anemic preop and there was minimal blood loss intra-op (used tourniquet).   * CIR consulted.   SUBJECTIVE: Pain well controlled.   PHYSICAL EXAM: Filed Vitals:   12/23/15 1510 12/23/15 1533 12/23/15 1947 12/24/15 0206  BP: 122/55 127/46 116/87 112/49  Pulse: 79 77 80 74  Temp:  98.4 F (36.9 C) 98.1 F (36.7 C) 98.4 F (36.9 C)  TempSrc:   Oral   Resp: 13 16 17 16   SpO2: 99% 100% 100% 100%   Dressing on right AKA is dry.   LABS: Lab Results  Component Value Date   WBC 9.4 12/24/2015   HGB 7.1* 12/24/2015   HCT 23.2* 12/24/2015   MCV 74.8* 12/24/2015   PLT 480* 12/24/2015   Lab Results  Component Value Date   CREATININE 0.56 12/24/2015   Lab Results  Component Value Date   INR 1.56* 11/25/2015   CBG (last 3)   Recent Labs  12/23/15 1439 12/23/15 1711 12/24/15 0653  GLUCAP 111* 173* 218*    Active Problems:   Atherosclerosis of native arteries of extremities with gangrene, right leg ([place])   Gae Gallop [id number] 12/24/2015

## 2015-12-25 LAB — BASIC METABOLIC PANEL
ANION GAP: 9 (ref 5–15)
BUN: 9 mg/dL (ref 6–20)
CALCIUM: 8.9 mg/dL (ref 8.9–10.3)
CO2: 25 mmol/L (ref 22–32)
Chloride: 102 mmol/L (ref 101–111)
Creatinine, Ser: 0.57 mg/dL (ref 0.44–1.00)
Glucose, Bld: 119 mg/dL — ABNORMAL HIGH (ref 65–99)
Potassium: 4.3 mmol/L (ref 3.5–5.1)
SODIUM: 136 mmol/L (ref 135–145)

## 2015-12-25 LAB — CBC
HEMATOCRIT: 25.2 % — AB (ref 36.0–46.0)
Hemoglobin: 7.8 g/dL — ABNORMAL LOW (ref 12.0–15.0)
MCH: 23.1 pg — ABNORMAL LOW (ref 26.0–34.0)
MCHC: 31 g/dL (ref 30.0–36.0)
MCV: 74.8 fL — ABNORMAL LOW (ref 78.0–100.0)
Platelets: 537 10*3/uL — ABNORMAL HIGH (ref 150–400)
RBC: 3.37 MIL/uL — ABNORMAL LOW (ref 3.87–5.11)
RDW: 23.1 % — ABNORMAL HIGH (ref 11.5–15.5)
WBC: 10.3 10*3/uL (ref 4.0–10.5)

## 2015-12-25 MED ORDER — CHLORHEXIDINE GLUCONATE CLOTH 2 % EX PADS: 6.0000 | MEDICATED_PAD | Freq: Every day | CUTANEOUS | Status: DC

## 2015-12-25 MED ORDER — MUPIROCIN 2 % EX OINT
1.0000 "application " | TOPICAL_OINTMENT | Freq: Two times a day (BID) | CUTANEOUS | Status: DC
  Filled 2015-12-25: qty 22

## 2015-12-25 NOTE — Progress Notes (Signed)
   VASCULAR SURGERY ASSESSMENT & PLAN:  * 2 Days Post-Op s/p: Right above-the-knee amputation. I changed the dressing today and the amputation site so far looks fine. She has urinary incontinence and it will be important to try to isolate her incision from this.  *  Begin daily dressing changes which I have ordered.  * Appreciate physical therapy's help. [facility] inpatient rehabilitation evaluation pending.  * Anemia: hemoglobin is up today. Her fecal occult blood was positive. She will need follow up with Dr. Ronald Lobo as an outpatient.   SUBJECTIVE: Pain well controlled.  PHYSICAL EXAM: Filed Vitals:   12/24/15 1600 12/24/15 2016 12/24/15 2130 12/25/15 0744  BP: 114/58 120/44 117/48 135/51  Pulse: 78 96 89 80  Temp: 98.2 F (36.8 C) 99.7 F (37.6 C) 100 F (37.8 C) 98.6 F (37 C)  TempSrc:  Oral Oral Oral  Resp: 16 16 16 16   SpO2: 100% 99% 99% 100%   Right above-the-knee amputation site was inspected and looks good.  LABS: Lab Results  Component Value Date   WBC 10.3 12/25/2015   HGB 7.8* 12/25/2015   HCT 25.2* 12/25/2015   MCV 74.8* 12/25/2015   PLT 537* 12/25/2015   Lab Results  Component Value Date   CREATININE 0.57 12/25/2015   CBG (last 3)   Recent Labs  12/23/15 1439 12/23/15 1711 12/24/15 0653  GLUCAP 111* 173* 218*    Active Problems:   Atherosclerosis of native arteries of extremities with gangrene, right leg [facility])   Gae Gallop Beeper: [id number] 12/25/2015

## 2015-12-25 NOTE — Progress Notes (Signed)
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 PMH: HTN, PVD, DM II, CKD, and arthritis.   Clinical Impression   PTA, pt was independent with ADLs and mobility. Pt currently requires mod +2 assist for sit-stand and stand-pivot transfers and min-mod assist for LB ADLs. Pt continues to be tearful of losing R leg and reports still feeling like the extremity is there and trapped underneath something. Feel pt is an excellent candidate for CIR for post-acute rehab stay as anticipate pt can achieve safe supervision, w/c level of function on completion of stay. Pt will benefit from continued acute OT to increase independence and safety with ADLs and mobility to allow for safe discharge to the venue listed below.    Follow Up Recommendations  CIR    Equipment Recommendations  3 in 1 bedside comode;Tub/shower bench    Recommendations for Other Services       Precautions / Restrictions Precautions Precautions: Fall Precaution Comments: new R AKA Restrictions Weight Bearing Restrictions: Yes RLE Weight Bearing: Non weight bearing      Mobility Bed Mobility Overal bed mobility: Needs Assistance;+2 for physical assistance Bed Mobility: Supine to Sit     Supine to sit: Mod assist;+2 for physical assistance     General bed mobility comments: Mod +2 assist to scoot hips EOB with bed pad and for trunk support to come to full sitting position. Verbal cues for hand placement and sequencing through tasks.  Transfers Overall transfer level: Needs assistance Equipment used: Rolling walker (2 wheeled) Transfers: Sit to/from [facility] Sit to Stand: Mod assist;+2 physical assistance Stand pivot transfers: Mod assist;+2 physical assistance       General transfer comment: Mod +2 assist for boost to stand, stabilize  balance, and to pivot hips. Verbal cues for proper hand placement and to stand up straight to facilitate hip extension during transfers.    Balance Overall balance assessment: Needs assistance Sitting-balance support: No upper extremity supported;Feet supported Sitting balance-Leahy Scale: Fair Sitting balance - Comments: Min assist initially for sitting balance - pt with posterior lean. Able to maintain with min guard assist after 3 minutes sitting EOB. Postural control: Posterior lean Standing balance support: Bilateral upper extremity supported;During functional activity Standing balance-Leahy Scale: Poor Standing balance comment: Heavy reliance on bilateral UE support to achieve standing position - even still pt in hip extension and forward head position despite mod verbal cues for proper positioning during transfers.                            ADL Overall ADL's : Needs assistance/impaired     Grooming: Wash/dry hands;Wash/dry face;Set up;Sitting   Upper Body Bathing: Set up;Sitting   Lower Body Bathing: Set up;Sitting/lateral leans Lower Body Bathing Details (indicate cue type and reason): Sitting on BSC, pt propped L leg up on chair and was able to weight shift laterally to complete Upper Body Dressing : Set up;Sitting   Lower Body Dressing: Set up;Sit to/from stand Lower Body Dressing Details (indicate cue type and reason): Propped L leg on recliner and leaned onto [facility] handle to don sock Toilet Transfer: Moderate assistance;+2 for physical assistance;Stand-pivot;BSC Toilet Transfer Details (indicate cue type and reason): Cues to pivot or twist foot, pt unable to hop Toileting- Clothing Manipulation and Hygiene: Minimal assistance;Sitting/lateral lean  Toileting - Clothing Manipulation Details (indicate cue type and reason): Able to complete pericare same way as LB bathing, required assist for clothing manipulation due to requiring bilateral UE support to maintain  upright standing position safely.     Functional mobility during ADLs: Moderate assistance;+2 for physical assistance       Vision Vision Assessment?: No apparent visual deficits   Perception     Praxis      Pertinent Vitals/Pain Pain Assessment: Faces Faces Pain Scale: Hurts little more Pain Location: R residual limb, phantom limb pain Pain Descriptors / Indicators: Discomfort;Guarding;Grimacing Pain Intervention(s): Limited activity within patient's tolerance;Monitored during session;Repositioned;Premedicated before session     Hand Dominance Right   Extremity/Trunk Assessment Upper Extremity Assessment Upper Extremity Assessment: Generalized weakness   Lower Extremity Assessment Lower Extremity Assessment: RLE deficits/detail;LLE deficits/detail RLE Deficits / Details: AKA LLE Deficits / Details: generalized weakness   Cervical / Trunk Assessment Cervical / Trunk Assessment: Kyphotic   Communication Communication Communication: No difficulties   Cognition Arousal/Alertness: Lethargic Behavior During Therapy: WFL for tasks assessed/performed Overall Cognitive Status: Within Functional Limits for tasks assessed                     General Comments       Exercises       Shoulder Instructions      Home Living Family/patient expects to be discharged to:: Inpatient rehab Living Arrangements: Children                               Additional Comments: pt lives in a home with her children in which someone is always there. Pt was using a RW PTA due to R LE pain      Prior Functioning/Environment Level of Independence: Independent with assistive device(s)        Comments: pt was indep PTA but needed RW due to R LE pain    OT Diagnosis: Generalized weakness;Acute pain   OT Problem List: Decreased strength;Decreased range of motion;Decreased activity tolerance;Impaired balance (sitting and/or standing);Decreased coordination;Decreased  safety awareness;Decreased knowledge of use of DME or AE;Decreased knowledge of precautions;Pain   OT Treatment/Interventions: Self-care/ADL training;Therapeutic exercise;Energy conservation;DME and/or AE instruction;Therapeutic activities;Patient/family education;Balance training    OT Goals(Current goals can be found in the care plan section) Acute Rehab OT Goals Patient Stated Goal: to go to rehab then go home OT Goal Formulation: With patient Time For Goal Achievement: 01/08/16 Potential to Achieve Goals: Good ADL Goals Pt Will Perform Grooming: sitting;with modified independence Pt Will Perform Upper Body Bathing: sitting;with modified independence Pt Will Perform Lower Body Bathing: sitting/lateral leans;with modified independence Pt Will Transfer to Toilet: with min guard assist;stand pivot transfer;bedside commode Pt Will Perform Toileting - Clothing Manipulation and hygiene: with modified independence;sitting/lateral leans Additional ADL Goal #1: Pt will transfer in/out of wheelchair with min guard assist to increase independence with ADLs and mobility.  OT Frequency: Min 2X/week   Barriers to D/C:            Co-evaluation              End of Session Equipment Utilized During Treatment: Gait belt Nurse Communication: Mobility status  Activity Tolerance: Patient tolerated treatment well Patient left: in chair;with call bell/phone within reach;with chair alarm set   Time: [id number] OT Time Calculation (min): 23 min Charges:  OT General Charges $OT Visit: 1 Procedure OT Evaluation $OT Eval Moderate Complexity: 1 Procedure OT  Treatments $Self Care/Home Management : 8-22 mins G-Codes:    Redmond Baseman, OTR/L Pager: [phone] 12/25/2015, 3:05 PM

## 2015-12-25 NOTE — Progress Notes (Signed)
Rehab Admissions Coordinator Note:  Patient was screened by Retta Diones for appropriateness for an Inpatient Acute Rehab Consult.  At this time, a rehab consult has been ordered and is pending completion.  We will follow up once consult is completed.  Retta Diones 12/25/2015, 9:49 AM  I can be reached at [phone].

## 2015-12-26 ENCOUNTER — Encounter (HOSPITAL_COMMUNITY): Payer: Self-pay | Admitting: Physical Medicine & Rehabilitation

## 2015-12-26 ENCOUNTER — Inpatient Hospital Stay (HOSPITAL_COMMUNITY)
Admission: AD | Admit: 2015-12-26 | Discharge: 2016-01-05 | DRG: 300 | Disposition: A | Discharge status: Home Health | Payer: Medicare Other | Source: Intra-hospital | Attending: Physical Medicine & Rehabilitation | Admitting: Physical Medicine & Rehabilitation

## 2015-12-26 DIAGNOSIS — I10 Essential (primary) hypertension: Secondary | ICD-10-CM | POA: Diagnosis present

## 2015-12-26 DIAGNOSIS — Z87891 Personal history of nicotine dependence: Secondary | ICD-10-CM

## 2015-12-26 DIAGNOSIS — R32 Unspecified urinary incontinence: Secondary | ICD-10-CM | POA: Diagnosis present

## 2015-12-26 DIAGNOSIS — R195 Other fecal abnormalities: Secondary | ICD-10-CM | POA: Diagnosis present

## 2015-12-26 DIAGNOSIS — R030 Elevated blood-pressure reading, without diagnosis of hypertension: Secondary | ICD-10-CM | POA: Diagnosis not present

## 2015-12-26 DIAGNOSIS — R269 Unspecified abnormalities of gait and mobility: Secondary | ICD-10-CM | POA: Diagnosis present

## 2015-12-26 DIAGNOSIS — D62 Acute posthemorrhagic anemia: Secondary | ICD-10-CM

## 2015-12-26 DIAGNOSIS — S78119A Complete traumatic amputation at level between unspecified hip and knee, initial encounter: Secondary | ICD-10-CM

## 2015-12-26 DIAGNOSIS — R531 Weakness: Secondary | ICD-10-CM | POA: Insufficient documentation

## 2015-12-26 DIAGNOSIS — Z7984 Long term (current) use of oral hypoglycemic drugs: Secondary | ICD-10-CM | POA: Diagnosis not present

## 2015-12-26 DIAGNOSIS — D75839 Thrombocytosis, unspecified: Secondary | ICD-10-CM | POA: Diagnosis present

## 2015-12-26 DIAGNOSIS — G546 Phantom limb syndrome with pain: Secondary | ICD-10-CM | POA: Diagnosis present

## 2015-12-26 DIAGNOSIS — D7589 Other specified diseases of blood and blood-forming organs: Secondary | ICD-10-CM | POA: Diagnosis present

## 2015-12-26 DIAGNOSIS — Z89611 Acquired absence of right leg above knee: Secondary | ICD-10-CM | POA: Diagnosis not present

## 2015-12-26 DIAGNOSIS — D638 Anemia in other chronic diseases classified elsewhere: Secondary | ICD-10-CM | POA: Diagnosis present

## 2015-12-26 DIAGNOSIS — I739 Peripheral vascular disease, unspecified: Secondary | ICD-10-CM | POA: Diagnosis present

## 2015-12-26 DIAGNOSIS — Z8249 Family history of ischemic heart disease and other diseases of the circulatory system: Secondary | ICD-10-CM | POA: Diagnosis not present

## 2015-12-26 DIAGNOSIS — K921 Melena: Secondary | ICD-10-CM

## 2015-12-26 DIAGNOSIS — R0989 Other specified symptoms and signs involving the circulatory and respiratory systems: Secondary | ICD-10-CM | POA: Insufficient documentation

## 2015-12-26 DIAGNOSIS — Z7982 Long term (current) use of aspirin: Secondary | ICD-10-CM

## 2015-12-26 DIAGNOSIS — B9620 Unspecified Escherichia coli [E. coli] as the cause of diseases classified elsewhere: Secondary | ICD-10-CM | POA: Diagnosis present

## 2015-12-26 DIAGNOSIS — Z89619 Acquired absence of unspecified leg above knee: Secondary | ICD-10-CM

## 2015-12-26 DIAGNOSIS — E118 Type 2 diabetes mellitus with unspecified complications: Secondary | ICD-10-CM

## 2015-12-26 DIAGNOSIS — I272 Other secondary pulmonary hypertension: Secondary | ICD-10-CM | POA: Diagnosis present

## 2015-12-26 DIAGNOSIS — Z79899 Other long term (current) drug therapy: Secondary | ICD-10-CM

## 2015-12-26 DIAGNOSIS — E1142 Type 2 diabetes mellitus with diabetic polyneuropathy: Secondary | ICD-10-CM | POA: Diagnosis present

## 2015-12-26 DIAGNOSIS — I351 Nonrheumatic aortic (valve) insufficiency: Secondary | ICD-10-CM | POA: Diagnosis present

## 2015-12-26 DIAGNOSIS — G8918 Other acute postprocedural pain: Secondary | ICD-10-CM

## 2015-12-26 DIAGNOSIS — D473 Essential (hemorrhagic) thrombocythemia: Secondary | ICD-10-CM | POA: Diagnosis present

## 2015-12-26 DIAGNOSIS — N390 Urinary tract infection, site not specified: Secondary | ICD-10-CM | POA: Diagnosis present

## 2015-12-26 HISTORY — DX: Acquired absence of unspecified leg above knee: Z89.619

## 2015-12-26 LAB — GLUCOSE, CAPILLARY
GLUCOSE-CAPILLARY: 116 mg/dL — AB (ref 65–99)
Glucose-Capillary: 131 mg/dL — ABNORMAL HIGH (ref 65–99)

## 2015-12-26 MED ORDER — ALUM & MAG HYDROXIDE-SIMETH 200-200-20 MG/5ML PO SUSP: 15.0000 mL | ORAL | Status: DC | PRN

## 2015-12-26 MED ORDER — INSULIN ASPART 100 UNIT/ML ~~LOC~~ SOLN: 0.0000 [IU] | Freq: Every day | SUBCUTANEOUS | Status: DC

## 2015-12-26 MED ORDER — ASPIRIN EC 81 MG PO TBEC
81.0000 mg | DELAYED_RELEASE_TABLET | Freq: Every day | ORAL | Status: DC
  Administered 2015-12-27 – 2016-01-05 (×10): 81 mg via ORAL
  Filled 2015-12-26 (×10): qty 1

## 2015-12-26 MED ORDER — PHENOL 1.4 % MT LIQD
1.0000 | OROMUCOSAL | Status: DC | PRN
Start: 2015-12-26 — End: 2016-01-05

## 2015-12-26 MED ORDER — PROCHLORPERAZINE MALEATE 5 MG PO TABS: 5.0000 mg | ORAL_TABLET | Freq: Four times a day (QID) | ORAL | Status: DC | PRN

## 2015-12-26 MED ORDER — VITAMIN D3 25 MCG (1000 UNIT) PO TABS
2000.0000 [IU] | ORAL_TABLET | Freq: Every day | ORAL | Status: DC
  Administered 2015-12-27 – 2016-01-05 (×10): 2000 [IU] via ORAL
  Filled 2015-12-26 (×20): qty 2

## 2015-12-26 MED ORDER — AMLODIPINE BESYLATE 5 MG PO TABS
5.0000 mg | ORAL_TABLET | Freq: Every day | ORAL | Status: DC
  Administered 2015-12-27 – 2016-01-05 (×10): 5 mg via ORAL
  Filled 2015-12-26 (×10): qty 1

## 2015-12-26 MED ORDER — FLEET ENEMA 7-19 GM/118ML RE ENEM
1.0000 | ENEMA | Freq: Once | RECTAL | Status: AC | PRN
  Administered 2015-12-28: 1 via RECTAL
  Filled 2015-12-26: qty 1

## 2015-12-26 MED ORDER — BOOST / RESOURCE BREEZE PO LIQD
237.0000 mL | Freq: Three times a day (TID) | ORAL | Status: DC
  Administered 2015-12-26 – 2015-12-27 (×2): 1 via ORAL
  Filled 2015-12-26 (×3): qty 237

## 2015-12-26 MED ORDER — METFORMIN HCL 500 MG PO TABS
500.0000 mg | ORAL_TABLET | Freq: Every day | ORAL | Status: DC
  Administered 2015-12-27 – 2016-01-05 (×10): 500 mg via ORAL
  Filled 2015-12-26 (×10): qty 1

## 2015-12-26 MED ORDER — DOCUSATE SODIUM 100 MG PO CAPS
100.0000 mg | ORAL_CAPSULE | Freq: Every day | ORAL | Status: DC
  Administered 2015-12-27 – 2015-12-28 (×2): 100 mg via ORAL
  Filled 2015-12-26 (×2): qty 1

## 2015-12-26 MED ORDER — INSULIN ASPART 100 UNIT/ML ~~LOC~~ SOLN
0.0000 [IU] | Freq: Three times a day (TID) | SUBCUTANEOUS | Status: DC
  Administered 2015-12-27 – 2016-01-03 (×5): 1 [IU] via SUBCUTANEOUS
  Administered 2016-01-04: 2 [IU] via SUBCUTANEOUS
  Administered 2016-01-04: 1 [IU] via SUBCUTANEOUS

## 2015-12-26 MED ORDER — BISACODYL 10 MG RE SUPP: 10.0000 mg | Freq: Every day | RECTAL | Status: DC | PRN

## 2015-12-26 MED ORDER — ENOXAPARIN SODIUM 40 MG/0.4ML ~~LOC~~ SOLN
40.0000 mg | SUBCUTANEOUS | Status: DC
  Administered 2015-12-27 – 2016-01-05 (×10): 40 mg via SUBCUTANEOUS
  Filled 2015-12-26 (×10): qty 0.4

## 2015-12-26 MED ORDER — FERROUS SULFATE 325 (65 FE) MG PO TABS
325.0000 mg | ORAL_TABLET | Freq: Two times a day (BID) | ORAL | Status: DC
  Administered 2015-12-27 – 2016-01-05 (×19): 325 mg via ORAL
  Filled 2015-12-26 (×20): qty 1

## 2015-12-26 MED ORDER — LISINOPRIL 10 MG PO TABS
10.0000 mg | ORAL_TABLET | Freq: Every day | ORAL | Status: DC
  Administered 2015-12-27 – 2016-01-05 (×10): 10 mg via ORAL
  Filled 2015-12-26 (×10): qty 1

## 2015-12-26 MED ORDER — PROCHLORPERAZINE EDISYLATE 5 MG/ML IJ SOLN: 5.0000 mg | Freq: Four times a day (QID) | INTRAMUSCULAR | Status: DC | PRN

## 2015-12-26 MED ORDER — GUAIFENESIN-DM 100-10 MG/5ML PO SYRP
15.0000 mL | ORAL_SOLUTION | ORAL | Status: DC | PRN
  Administered 2015-12-30 – 2016-01-02 (×8): 15 mL via ORAL
  Filled 2015-12-26 (×9): qty 15

## 2015-12-26 MED ORDER — PANTOPRAZOLE SODIUM 40 MG PO TBEC
40.0000 mg | DELAYED_RELEASE_TABLET | Freq: Two times a day (BID) | ORAL | Status: DC
  Administered 2015-12-26 – 2016-01-05 (×20): 40 mg via ORAL
  Filled 2015-12-26 (×20): qty 1

## 2015-12-26 MED ORDER — SENNOSIDES-DOCUSATE SODIUM 8.6-50 MG PO TABS: 1.0000 | ORAL_TABLET | Freq: Every evening | ORAL | Status: DC | PRN

## 2015-12-26 MED ORDER — ALUM & MAG HYDROXIDE-SIMETH 200-200-20 MG/5ML PO SUSP: 30.0000 mL | ORAL | Status: DC | PRN

## 2015-12-26 MED ORDER — BACLOFEN 5 MG HALF TABLET
5.0000 mg | ORAL_TABLET | Freq: Two times a day (BID) | ORAL | Status: DC
  Administered 2015-12-26 – 2015-12-28 (×4): 5 mg via ORAL
  Filled 2015-12-26 (×4): qty 1

## 2015-12-26 MED ORDER — OXYCODONE-ACETAMINOPHEN 5-325 MG PO TABS
1.0000 | ORAL_TABLET | ORAL | Status: DC | PRN
  Administered 2015-12-26: 2 via ORAL
  Administered 2015-12-27: 1 via ORAL
  Administered 2015-12-27 (×2): 2 via ORAL
  Administered 2015-12-28: 1 via ORAL
  Administered 2015-12-28 (×2): 2 via ORAL
  Administered 2015-12-28: 1 via ORAL
  Administered 2015-12-29 (×2): 2 via ORAL
  Filled 2015-12-26: qty 2
  Filled 2015-12-26 (×2): qty 1
  Filled 2015-12-26 (×3): qty 2
  Filled 2015-12-26: qty 1
  Filled 2015-12-26 (×3): qty 2

## 2015-12-26 MED ORDER — ENOXAPARIN SODIUM 40 MG/0.4ML ~~LOC~~ SOLN: 40.0000 mg | SUBCUTANEOUS | Status: DC

## 2015-12-26 MED ORDER — TRAZODONE HCL 50 MG PO TABS: 25.0000 mg | ORAL_TABLET | Freq: Every evening | ORAL | Status: DC | PRN

## 2015-12-26 MED ORDER — ALBUTEROL SULFATE (2.5 MG/3ML) 0.083% IN NEBU: 2.5000 mg | INHALATION_SOLUTION | Freq: Four times a day (QID) | RESPIRATORY_TRACT | Status: DC | PRN

## 2015-12-26 MED ORDER — PROCHLORPERAZINE 25 MG RE SUPP: 12.5000 mg | Freq: Four times a day (QID) | RECTAL | Status: DC | PRN

## 2015-12-26 MED ORDER — ACETAMINOPHEN 325 MG PO TABS
325.0000 mg | ORAL_TABLET | ORAL | Status: DC | PRN
  Filled 2015-12-26: qty 2

## 2015-12-26 MED ORDER — OXYCODONE HCL 5 MG PO TABS
5.0000 mg | ORAL_TABLET | ORAL | Status: DC | PRN
  Administered 2015-12-31 – 2016-01-05 (×3): 5 mg via ORAL
  Filled 2015-12-26 (×3): qty 1

## 2015-12-26 NOTE — Progress Notes (Signed)
   VASCULAR SURGERY ASSESSMENT & PLAN:  * 3 Days Post-Op s/p: Right AKA  *  Begin daily dressing changes today.   * ANEMIA: Her hemoglobin yesterday had improved. Her stool was guaiac positive. She has been followed by Dr. Cristina Gong as an outpatient. I will recheck her hemoglobin tomorrow. If it drops that she may need evaluation by GI while she is in the hospital.  * CIR evaluation pending.   SUBJECTIVE: No complaints.  PHYSICAL EXAM: Filed Vitals:   12/25/15 0744 12/25/15 2057 12/26/15 0308 12/26/15 0600  BP: 135/51 126/53 112/63   Pulse: 80 79 77   Temp: 98.6 F (37 C) 98.6 F (37 C) 97.7 F (36.5 C)   TempSrc: Oral Oral Oral   Resp: 16 16 18 16   SpO2: 100% 97% 97%    Dressing is dry. I inspected wound yesterday and it looks good.  LABS: Lab Results  Component Value Date   WBC 10.3 12/25/2015   HGB 7.8* 12/25/2015   HCT 25.2* 12/25/2015   MCV 74.8* 12/25/2015   PLT 537* 12/25/2015   Lab Results  Component Value Date   CREATININE 0.57 12/25/2015   CBG (last 3)   Recent Labs  12/23/15 1439 12/23/15 1711 12/24/15 0653  GLUCAP 111* 173* 218*    Active Problems:   Atherosclerosis of native arteries of extremities with gangrene, right leg [facility])  Gae Gallop Beeper: [id number] 12/26/2015

## 2015-12-26 NOTE — Consult Note (Addendum)
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 Scot Dock.    HPI: Jessica Abbott is a 75 y.o. female with history of HTN, DMT2, CKD, pulmonary HTN, chronic right knee contracture, PVD s/p left femoral to dorsalis pedis bypass who developed right foot wound and superficial occlusion but declined AKA and anemia due to subacute GIB 11/2015 (negative EGD/colonoscopy). Mobility limited in past 1-2 months to few steps with walker and wheelchair due to RLE pain and ischemia. 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 Scot Dock.  Hospital course complicated by acute on chronic anemia noted with heme positive stool, pain management.  Denies associated phantom limb pain. Therapy evaluations done and CIR recommended to help patient regain prior level of independence.     Review of Systems  HENT: Negative for hearing loss.   Eyes: Negative for blurred vision and double vision.  Respiratory: Negative for cough and shortness of breath.   Cardiovascular: Negative for chest pain and palpitations.  Gastrointestinal: Negative for heartburn and nausea.  Genitourinary: Negative for dysuria and urgency.  Musculoskeletal: Positive for myalgias and back pain (chronic).  Skin: Negative for itching and rash.  Neurological: Positive for sensory change (left hand--question due to IV). Negative for dizziness and headaches.  Psychiatric/Behavioral: Positive for depression. The patient does not have insomnia.   All other systems reviewed and are negative.     Past Medical History  Diagnosis Date  . Hypertension   . Peripheral vascular disease ([place])   . Diabetes mellitus without complication ([place])   . Arthritis   . Peripheral arterial disease ([place])   . Chronic kidney disease   . SOB (shortness of breath)   . History of pneumonia   . History of bronchitis   . Stress incontinence   .  Anemia   . History of blood transfusion   . Pulmonary hypertension ([place])     [id number] echo: PA systolic pressure severely increaed, PA peak pressure 66 mm Hg.   Marland Kitchen Aortic regurgitation     moderate AR by 11/25/15 echo    Past Surgical History  Procedure Laterality Date  . Back surgery  2002  . Abdominal hysterectomy    . Femoral artery arteriogram  04/20/2015  . Esophagogastroduodenoscopy (egd) with propofol N/A 11/23/2015    Procedure: ESOPHAGOGASTRODUODENOSCOPY (EGD) WITH PROPOFOL;  Surgeon: Ronald Lobo, MD;  Location: [place];  Service: Endoscopy;  Laterality: N/A;  . Colonoscopy N/A 11/24/2015    Procedure: COLONOSCOPY;  Surgeon: Ronald Lobo, MD;  Location: [facility] ENDOSCOPY;  Service: Endoscopy;  Laterality: N/A;  . Givens capsule study N/A 11/26/2015    Procedure: GIVENS CAPSULE STUDY;  Surgeon: Ronald Lobo, MD;  Location: [place];  Service: Endoscopy;  Laterality: N/A;    Family History  Problem Relation Age of Onset  . Heart disease Father     Social History:   Family has been living with her for past couple of months to help with housework.  Independent for walking short distances with walker.  She reports that she quit smoking about 1 year ago. She has never used smokeless tobacco. She reports that she does not drink alcohol or use illicit drugs.    Allergies: No Known Allergies    Medications Prior to Admission  Medication Sig Dispense Refill  . acetaminophen (TYLENOL) 500 MG tablet Take 500 mg by mouth every 6 (six) hours as needed (pain).    Marland Kitchen  albuterol (PROVENTIL HFA;VENTOLIN HFA) 108 (90 Base) MCG/ACT inhaler Inhale 2 puffs into the lungs every 6 (six) hours as needed for wheezing or shortness of breath.    Marland Kitchen amLODipine (NORVASC) 5 MG tablet Take 5 mg by mouth daily.    Marland Kitchen aspirin EC 81 MG tablet Take 81 mg by mouth daily.     . baclofen (LIORESAL) 10 MG tablet Take 0.5 tablets (5 mg total) by mouth 2 (two) times daily. 30 each 0  . Cholecalciferol (VITAMIN  D3) 2000 units capsule Take 2,000 Units by mouth daily.     Marland Kitchen docusate sodium (COLACE) 100 MG capsule Take 100 mg by mouth daily as needed (constipation).    . ferrous sulfate 325 (65 FE) MG tablet Take 325 mg by mouth daily with breakfast.    . HYDROcodone-acetaminophen (NORCO/VICODIN) 5-325 MG tablet Take 1 tablet by mouth every 6 (six) hours as needed (pain). Reported on 10/28/2015    . lactose free nutrition (BOOST) LIQD Take 237 mLs by mouth daily as needed.    Marland Kitchen lisinopril (PRINIVIL,ZESTRIL) 10 MG tablet Take 10 mg by mouth daily.    . metFORMIN (GLUCOPHAGE) 500 MG tablet Take 500 mg by mouth daily with breakfast.    . pantoprazole (PROTONIX) 40 MG tablet Take 1 tablet (40 mg total) by mouth 2 (two) times daily. 60 tablet 0    Home: Home Living Family/patient expects to be discharged to:: Inpatient rehab Living Arrangements: Children Additional Comments: pt lives in a home with her children in which someone is always there. Pt was using a RW PTA due to R LE pain  Functional History: Prior Function Level of Independence: Independent with assistive device(s) Comments: pt was indep PTA but needed RW due to R LE pain Functional Status:  Mobility: Bed Mobility Overal bed mobility: Needs Assistance, +2 for physical assistance Bed Mobility: Supine to Sit Supine to sit: Mod assist, +2 for physical assistance General bed mobility comments: Mod +2 assist to scoot hips EOB with bed pad and for trunk support to come to full sitting position. Verbal cues for hand placement and sequencing through tasks. Transfers Overall transfer level: Needs assistance Equipment used: Rolling walker (2 wheeled) Transfers: Sit to/from Stand, [employer] Transfers Sit to Stand: Mod assist, +2 physical assistance Stand pivot transfers: Mod assist, +2 physical assistance General transfer comment: Mod +2 assist for boost to stand, stabilize balance, and to pivot hips. Verbal cues for proper hand placement and to  stand up straight to facilitate hip extension during transfers. Ambulation/Gait General Gait Details: pt unable to tolerate at this time    ADL: ADL Overall ADL's : Needs assistance/impaired Grooming: Wash/dry hands, Wash/dry face, Set up, Sitting Upper Body Bathing: Set up, Sitting Lower Body Bathing: Set up, Sitting/lateral leans Lower Body Bathing Details (indicate cue type and reason): Sitting on BSC, pt propped L leg up on chair and was able to weight shift laterally to complete Upper Body Dressing : Set up, Sitting Lower Body Dressing: Set up, Sit to/from stand Lower Body Dressing Details (indicate cue type and reason): Propped L leg on recliner and leaned onto [facility] handle to don sock Toilet Transfer: Moderate assistance, +2 for physical assistance, Stand-pivot, BSC Toilet Transfer Details (indicate cue type and reason): Cues to pivot or twist foot, pt unable to hop Toileting- Clothing Manipulation and Hygiene: Minimal assistance, Sitting/lateral lean Toileting - Clothing Manipulation Details (indicate cue type and reason): Able to complete pericare same way as LB bathing, required assist for clothing  manipulation due to requiring bilateral UE support to maintain upright standing position safely. Functional mobility during ADLs: Moderate assistance, +2 for physical assistance  Cognition: Cognition Overall Cognitive Status: Within Functional Limits for tasks assessed Orientation Level: Oriented X4 Cognition Arousal/Alertness: Lethargic Behavior During Therapy: WFL for tasks assessed/performed Overall Cognitive Status: Within Functional Limits for tasks assessed   Blood pressure 112/63, pulse 77, temperature 97.7 F (36.5 C), temperature source Oral, resp. rate 16, SpO2 97 %. Physical Exam  Nursing note and vitals reviewed. Constitutional: She is oriented to person, place, and time. She appears well-developed and well-nourished.  Flat affect but appropriate. Got teary  discussing limb loss.   HENT:  Head: Normocephalic and atraumatic.  Mouth/Throat: Oropharynx is clear and moist.  Eyes: Conjunctivae and EOM are normal. Pupils are equal, round, and reactive to light.  Neck: Normal range of motion. Neck supple.  Cardiovascular: Normal rate and regular rhythm.   Respiratory: Effort normal and breath sounds normal. No respiratory distress. She has no wheezes.  GI: Soft. Bowel sounds are normal. She exhibits no distension. There is no tenderness.  Musculoskeletal: She exhibits tenderness.  Min edema R-AKA--covered with stocking.   Neurological: She is alert and oriented to person, place, and time.  Speech clear.  Able to follow basic commands without difficulty.   Motor: B/l UE 5/5 LLE hip flexion 4-/5, knee extension 4/5, ankle dorsi/plantar flexion 4+/5 Right hip flexion 3+/5 (pain inhibition)  Skin: Skin is warm and dry.  Psychiatric: She has a normal mood and affect. Thought content normal.    No results found for this or any previous visit (from the past 24 hour(s)). No results found.  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  Records reviewed and summated above. PT/OT for mobility, ADL's, strengthening,and wheelchair training Clean amputation daily with soap and water Monitor incision site for signs of infection or impending skin breakdown. Staples to remain in place for 3-4 weeks Stump shrinker, for edema control  Scar mobilization massaging to prevent soft tissue adherence Stump protector during therapies Prevent flexion contractures by implementing the following:   Encourage prone lying for 20-30 mins per day BID to avoid hip flexion  Contractures if medically appropriate;  Avoid prolonged sitting Post surgical pain control with oral medication Phantom limb pain control with physical modalities including desensitization techniques (gentle self massage to the residual stump,hot packs if  sensation iintact, [place]) and mirror therapy, TENS. If ineffective, consider pharmacological treatment for neuropathic pain (e.g gabapentin, pregabalin, amytriptalyine, duloxetine).  Avoid injury to contralateral side  1. Does the need for close, 24 hr/day medical supervision in concert with the patient's rehab needs make it unreasonable for this patient to be served in a less intensive setting? Yes  2. Co-Morbidities requiring supervision/potential complications: HTN (monitor and provide prns in accordance with increased physical exertion and pain), DMT2 DM (Monitor in accordance with exercise and adjust meds as necessary), CKD (avoid nephrotoxic meds), pulmonary HTN, chronic right knee contracture, PVD s/p left femoral to dorsalis pedis bypass (cont meds), acute on chronic anemia (transfuse if necessary to ensure appropriate perfusion for increased activity tolerance), heme positive stool (monitor Hb, consider further workup if necessary), pain management (Biofeedback training with therapies to help reduce reliance on opiate pain medications, monitor pain control during therapies, and sedation at rest and titrate to maximum efficacy to ensure participation and gains in therapies) 3. Due to bladder management, safety, skin/wound care, disease management, medication administration, pain management and patient education,  does the patient require 24 hr/day rehab nursing? Yes 4. Does the patient require coordinated care of a physician, rehab nurse, PT (1-2 hrs/day, 5 days/week) and OT (1-2 hrs/day, 5 days/week) to address physical and functional deficits in the context of the above medical diagnosis(es)? Yes Addressing deficits in the following areas: balance, endurance, locomotion, strength, transferring, bathing, dressing, toileting and psychosocial support 5. Can the patient actively participate in an intensive therapy program of at least 3 hrs of therapy per day at least 5 days per week? Yes 6. The potential  for patient to make measurable gains while on inpatient rehab is excellent 7. Anticipated functional outcomes upon discharge from inpatient rehab are supervision and min assist  with PT, supervision and min assist with OT, n/a with SLP. 8. Estimated rehab length of stay to reach the above functional goals is: 10-14 days. 9. Does the patient have adequate social supports and living environment to accommodate these discharge functional goals? Yes 10. Anticipated D/C setting: Home 11. Anticipated post D/C treatments: HH therapy and Home excercise program 12. Overall Rehab/Functional Prognosis: good  RECOMMENDATIONS: This patient's condition is appropriate for continued rehabilitative care in the following setting: CIR Patient has agreed to participate in recommended program. Yes Note that insurance prior authorization may be required for reimbursement for recommended care.  Comment: Rehab Admissions Coordinator to follow up.  Delice Lesch, MD 12/26/2015

## 2015-12-26 NOTE — Progress Notes (Signed)
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 Scot Dock.    HPI: Jessica Abbott is a 75 y.o. female with history of HTN, DMT2, CKD, pulmonary HTN, chronic right knee contracture, PVD s/p left femoral to dorsalis pedis bypass who developed right foot wound and superficial occlusion but declined AKA and anemia due to subacute GIB 11/2015 (negative EGD/colonoscopy). Mobility limited in past 1-2 months to few steps with walker and wheelchair due to RLE pain and ischemia. 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 Scot Dock. Hospital course complicated by acute on chronic anemia noted with heme positive stool, pain management. Denies associated phantom limb pain. Therapy evaluations done and CIR recommended to help patient regain prior level of independence.    Review of Systems  HENT: Negative for hearing loss.  Eyes: Negative for blurred vision and double vision.  Respiratory: Negative for cough and shortness of breath.  Cardiovascular: Negative for chest pain and palpitations.  Gastrointestinal: Negative for heartburn and nausea.  Genitourinary: Negative for dysuria and urgency.  Musculoskeletal: Positive for myalgias and back pain (chronic).  Skin: Negative for itching and rash.  Neurological: Positive for sensory change (left hand--question due to IV). Negative for dizziness and headaches.  Psychiatric/Behavioral: Positive for depression. The patient does not have insomnia.  All other systems reviewed and are negative.     Past Medical History  Diagnosis Date  . Hypertension   . Peripheral vascular disease  ([place])   . Diabetes mellitus without complication ([place])   . Arthritis   . Peripheral arterial disease ([place])   . Chronic kidney disease   . SOB (shortness of breath)   . History of pneumonia   . History of bronchitis   . Stress incontinence   . Anemia   . History of blood transfusion   . Pulmonary hypertension ([place])     [id number] echo: PA systolic pressure severely increaed, PA peak pressure 66 mm Hg.   Marland Kitchen Aortic regurgitation     moderate AR by 11/25/15 echo    Past Surgical History  Procedure Laterality Date  . Back surgery  2002  . Abdominal hysterectomy    . Femoral artery arteriogram  04/20/2015  . Esophagogastroduodenoscopy (egd) with propofol N/A 11/23/2015    Procedure: ESOPHAGOGASTRODUODENOSCOPY (EGD) WITH PROPOFOL; Surgeon: Ronald Lobo, MD; Location: [facility]; Service: Endoscopy; Laterality: N/A;  . Colonoscopy N/A 11/24/2015    Procedure: COLONOSCOPY; Surgeon: Ronald Lobo, MD; Location: [facility] END[facility]COPY; Service: Endoscopy; Laterality: N/A;  . Givens capsule study N/A 11/26/2015    Procedure: GIVENS CAPSULE STUDY; Surgeon: Ronald Lobo, MD; Location: [place]; Service: Endoscopy; Laterality: N/A;    Family History  Problem Relation Age of Onset  . Heart disease Father     Social History: Family has been living with her for past couple of months to help with housework. Independent for walking short distances with walker. She reports that she quit smoking about 1 year ago. She has never used smokeless tobacco. She reports that she does not drink alcohol or use illicit drugs.    Allergies: No Known Allergies    Medications Prior to Admission  Medication Sig Dispense Refill  .  acetaminophen (TYLENOL) 500 MG tablet Take 500 mg by mouth every 6 (six) hours as needed (pain).    Marland Kitchen albuterol (PROVENTIL HFA;VENTOLIN HFA) 108 (90 Base) MCG/ACT inhaler  Inhale 2 puffs into the lungs every 6 (six) hours as needed for wheezing or shortness of breath.    Marland Kitchen amLODipine (NORVASC) 5 MG tablet Take 5 mg by mouth daily.    Marland Kitchen aspirin EC 81 MG tablet Take 81 mg by mouth daily.     . baclofen (LIORESAL) 10 MG tablet Take 0.5 tablets (5 mg total) by mouth 2 (two) times daily. 30 each 0  . Cholecalciferol (VITAMIN D3) 2000 units capsule Take 2,000 Units by mouth daily.     Marland Kitchen docusate sodium (COLACE) 100 MG capsule Take 100 mg by mouth daily as needed (constipation).    . ferrous sulfate 325 (65 FE) MG tablet Take 325 mg by mouth daily with breakfast.    . HYDROcodone-acetaminophen (NORCO/VICODIN) 5-325 MG tablet Take 1 tablet by mouth every 6 (six) hours as needed (pain). Reported on 10/28/2015    . lactose free nutrition (BOOST) LIQD Take 237 mLs by mouth daily as needed.    Marland Kitchen lisinopril (PRINIVIL,ZESTRIL) 10 MG tablet Take 10 mg by mouth daily.    . metFORMIN (GLUCOPHAGE) 500 MG tablet Take 500 mg by mouth daily with breakfast.    . pantoprazole (PROTONIX) 40 MG tablet Take 1 tablet (40 mg total) by mouth 2 (two) times daily. 60 tablet 0    Home: Home Living Family/patient expects to be discharged to:: Inpatient rehab Living Arrangements: Children Additional Comments: pt lives in a home with her children in which someone is always there. Pt was using a RW PTA due to R LE pain  Functional History: Prior Function Level of Independence: Independent with assistive device(s) Comments: pt was indep PTA but needed RW due to R LE pain Functional Status:  Mobility: Bed Mobility Overal bed mobility: Needs Assistance, +2 for physical assistance Bed Mobility: Supine to Sit Supine to sit: Mod assist, +2 for physical assistance General bed mobility comments: Mod +2 assist to scoot hips EOB with bed pad and for trunk support to come to full sitting position. Verbal cues for hand placement and sequencing  through tasks. Transfers Overall transfer level: Needs assistance Equipment used: Rolling walker (2 wheeled) Transfers: Sit to/from Stand, [employer] Transfers Sit to Stand: Mod assist, +2 physical assistance Stand pivot transfers: Mod assist, +2 physical assistance General transfer comment: Mod +2 assist for boost to stand, stabilize balance, and to pivot hips. Verbal cues for proper hand placement and to stand up straight to facilitate hip extension during transfers. Ambulation/Gait General Gait Details: pt unable to tolerate at this time    ADL: ADL Overall ADL's : Needs assistance/impaired Grooming: Wash/dry hands, Wash/dry face, Set up, Sitting Upper Body Bathing: Set up, Sitting Lower Body Bathing: Set up, Sitting/lateral leans Lower Body Bathing Details (indicate cue type and reason): Sitting on BSC, pt propped L leg up on chair and was able to weight shift laterally to complete Upper Body Dressing : Set up, Sitting Lower Body Dressing: Set up, Sit to/from stand Lower Body Dressing Details (indicate cue type and reason): Propped L leg on recliner and leaned onto [facility] handle to don sock Toilet Transfer: Moderate assistance, +2 for physical assistance, Stand-pivot, BSC Toilet Transfer Details (indicate cue type and reason): Cues to pivot or twist foot, pt unable to hop Toileting- Clothing Manipulation and Hygiene: Minimal assistance, Sitting/lateral lean Toileting -  Clothing Manipulation Details (indicate cue type and reason): Able to complete pericare same way as LB bathing, required assist for clothing manipulation due to requiring bilateral UE support to maintain upright standing position safely. Functional mobility during ADLs: Moderate assistance, +2 for physical assistance  Cognition: Cognition Overall Cognitive Status: Within Functional Limits for tasks assessed Orientation Level: Oriented X4 Cognition Arousal/Alertness: Lethargic Behavior During Therapy: WFL for tasks  assessed/performed Overall Cognitive Status: Within Functional Limits for tasks assessed   Blood pressure 112/63, pulse 77, temperature 97.7 F (36.5 C), temperature source Oral, resp. rate 16, SpO2 97 %. Physical Exam  Nursing note and vitals reviewed. Constitutional: She is oriented to person, place, and time. She appears well-developed and well-nourished.  Flat affect but appropriate. Got teary discussing limb loss.  HENT:  Head: Normocephalic and atraumatic.  Mouth/Throat: Oropharynx is clear and moist.  Eyes: Conjunctivae and EOM are normal. Pupils are equal, round, and reactive to light.  Neck: Normal range of motion. Neck supple.  Cardiovascular: Normal rate and regular rhythm.  Respiratory: Effort normal and breath sounds normal. No respiratory distress. She has no wheezes.  GI: Soft. Bowel sounds are normal. She exhibits no distension. There is no tenderness.  Musculoskeletal: She exhibits tenderness.  Min edema R-AKA--covered with stocking.  Neurological: She is alert and oriented to person, place, and time.  Speech clear.  Able to follow basic commands without difficulty.  Motor: B/l UE 5/5 LLE hip flexion 4-/5, knee extension 4/5, ankle dorsi/plantar flexion 4+/5 Right hip flexion 3+/5 (pain inhibition)  Skin: Skin is warm and dry.  Psychiatric: She has a normal mood and affect. Thought content normal.     Lab Results Last 24 Hours    No results found for this or any previous visit (from the past 24 hour(s)).    Imaging Results (Last 48 hours)    No results found.    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 Records reviewed and summated above. PT/OT for mobility, ADL's, strengthening,and wheelchair training Clean amputation daily with soap and water Monitor incision site for signs of infection or impending skin breakdown. Staples to remain in place for 3-4 weeks Stump shrinker, for edema control   Scar mobilization massaging to prevent soft tissue adherence Stump protector during therapies Prevent flexion contractures by implementing the following:  Encourage prone lying for 20-30 mins per day BID to avoid hip flexion Contractures if medically appropriate; Avoid prolonged sitting Post surgical pain control with oral medication Phantom limb pain control with physical modalities including desensitization techniques (gentle self massage to the residual stump,hot packs if sensation iintact, [place]) and mirror therapy, TENS. If ineffective, consider pharmacological treatment for neuropathic pain (e.g gabapentin, pregabalin, amytriptalyine, duloxetine).  Avoid injury to contralateral side  1. Does the need for close, 24 hr/day medical supervision in concert with the patient's rehab needs make it unreasonable for this patient to be served in a less intensive setting? Yes  2. Co-Morbidities requiring supervision/potential complications: HTN (monitor and provide prns in accordance with increased physical exertion and pain), DMT2 DM (Monitor in accordance with exercise and adjust meds as necessary), CKD (avoid nephrotoxic meds), pulmonary HTN, chronic right knee contracture, PVD s/p left femoral to dorsalis pedis bypass (cont meds), acute on chronic anemia (transfuse if necessary to ensure appropriate perfusion for increased activity tolerance), heme positive stool (monitor Hb, consider further workup if necessary), pain management (Biofeedback training with therapies to help reduce reliance on opiate pain medications, monitor pain control  during therapies, and sedation at rest and titrate to maximum efficacy to ensure participation and gains in therapies) 3. Due to bladder management, safety, skin/wound care, disease management, medication administration, pain management and patient education, does the patient require 24 hr/day rehab nursing? Yes 4. Does the patient  require coordinated care of a physician, rehab nurse, PT (1-2 hrs/day, 5 days/week) and OT (1-2 hrs/day, 5 days/week) to address physical and functional deficits in the context of the above medical diagnosis(es)? Yes Addressing deficits in the following areas: balance, endurance, locomotion, strength, transferring, bathing, dressing, toileting and psychosocial support 5. Can the patient actively participate in an intensive therapy program of at least 3 hrs of therapy per day at least 5 days per week? Yes 6. The potential for patient to make measurable gains while on inpatient rehab is excellent 7. Anticipated functional outcomes upon discharge from inpatient rehab are supervision and min assist with PT, supervision and min assist with OT, n/a with SLP. 8. Estimated rehab length of stay to reach the above functional goals is: 10-14 days. 9. Does the patient have adequate social supports and living environment to accommodate these discharge functional goals? Yes 10. Anticipated D/C setting: Home 11. Anticipated post D/C treatments: HH therapy and Home excercise program 12. Overall Rehab/Functional Prognosis: good  RECOMMENDATIONS: This patient's condition is appropriate for continued rehabilitative care in the following setting: CIR Patient has agreed to participate in recommended program. Yes Note that insurance prior authorization may be required for reimbursement for recommended care.  Comment: Rehab Admissions Coordinator to follow up.  Delice Lesch, MD 12/26/2015       Revision History     Date/Time User Provider Type Action   12/26/2015 3:04 PM Jessica Lorie Phenix, MD Physician Addend   12/26/2015 10:52 AM Jessica Lorie Phenix, MD Physician Sign   12/26/2015 10:32 AM Bary Leriche, PA-C Physician Assistant Share   View Details Report       Routing History     Date/Time From To Method   12/26/2015 3:04 PM Jessica Lorie Phenix, MD Nicola Girt, DO Fax

## 2015-12-26 NOTE — Progress Notes (Addendum)
I spoke with Dr. Scot Dock by phone and he is in agreement to d/c pt to inpt rehab today. He asks me to contact PA to assist with d/c. [phone]. I will make the arrangements to admit pt today. [id number]

## 2015-12-26 NOTE — H&P (View-Only) (Signed)
Physical Medicine and Rehabilitation Admission H&P    CC : R-AKA   HPI:  Jessica Abbott is a 75 y.o. female with history of HTN, DMT2, CKD, pulmonary HTN, chronic right knee contracture, PVD s/p left femoral to dorsalis pedis bypass who developed right foot wound and superficial occlusion but declined AKA and anemia due to subacute GIB 11/2015 (negative EGD/colonoscopy). Mobility limited in past 1-2 months to few steps with walker and wheelchair due to RLE pain and ischemia. 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 Scot Dock. Hospital course complicated by acute on chronic anemia noted with heme positive stool, pain management. Denies associated phantom limb pain. Therapy evaluations done and CIR recommended to help patient regain prior level of independence.    Review of Systems  HENT: Negative for hearing loss.   Eyes: Negative for blurred vision and double vision.  Respiratory: Negative for cough and shortness of breath.   Cardiovascular: Positive for leg swelling. Negative for chest pain and palpitations.  Gastrointestinal: Negative for heartburn, nausea and abdominal pain.  Genitourinary: Negative for dysuria and urgency.  Musculoskeletal: Positive for myalgias and joint pain (chronic back pain).  Neurological: Negative for headaches.  Psychiatric/Behavioral: Negative for memory loss. The patient is nervous/anxious. The patient does not have insomnia.       Past Medical History  Diagnosis Date  . Hypertension   . Peripheral vascular disease ([place])   . Diabetes mellitus without complication ([place])   . Arthritis   . Peripheral arterial disease ([place])   . Chronic kidney disease   . SOB (shortness of breath)   . History of pneumonia   . History of bronchitis   . Stress incontinence   . Anemia   . History of blood transfusion   . Pulmonary hypertension ([place])     5/62/56 echo: PA systolic pressure severely increaed, PA peak  pressure 66 mm Hg.   Marland Kitchen Aortic regurgitation     moderate AR by 11/25/15 echo    Past Surgical History  Procedure Laterality Date  . Back surgery  2002  . Abdominal hysterectomy    . Femoral artery arteriogram  04/20/2015  . Esophagogastroduodenoscopy (egd) with propofol N/A 11/23/2015    Procedure: ESOPHAGOGASTRODUODENOSCOPY (EGD) WITH PROPOFOL;  Surgeon: Ronald Lobo, MD;  Location: [facility];  Service: Endoscopy;  Laterality: N/A;  . Colonoscopy N/A 11/24/2015    Procedure: COLONOSCOPY;  Surgeon: Ronald Lobo, MD;  Location: [facility] ENDOSCOPY;  Service: Endoscopy;  Laterality: N/A;  . Givens capsule study N/A 11/26/2015    Procedure: GIVENS CAPSULE STUDY;  Surgeon: Ronald Lobo, MD;  Location: [facility];  Service: Endoscopy;  Laterality: N/A;    Family History  Problem Relation Age of Onset  . Heart disease Father     Social History:  Family has been living with her for past couple of months to help with housework. Independent for walking short distances with walker. She reports that she quit smoking about 1 year ago. She has never used smokeless tobacco. She reports that she does not drink alcohol or use illicit drugs.   Allergies: No Known Allergies    Medications Prior to Admission  Medication Sig Dispense Refill  . acetaminophen (TYLENOL) 500 MG tablet Take 500 mg by mouth every 6 (six) hours as needed (pain).    Marland Kitchen albuterol (PROVENTIL HFA;VENTOLIN HFA) 108 (90 Base) MCG/ACT inhaler Inhale 2 puffs into the lungs every 6 (six) hours as needed for wheezing or  shortness of breath.    Marland Kitchen amLODipine (NORVASC) 5 MG tablet Take 5 mg by mouth daily.    Marland Kitchen aspirin EC 81 MG tablet Take 81 mg by mouth daily.     . baclofen (LIORESAL) 10 MG tablet Take 0.5 tablets (5 mg total) by mouth 2 (two) times daily. 30 each 0  . Cholecalciferol (VITAMIN D3) 2000 units capsule Take 2,000 Units by mouth daily.     Marland Kitchen docusate sodium (COLACE) 100 MG capsule Take 100 mg by mouth daily as needed  (constipation).    . ferrous sulfate 325 (65 FE) MG tablet Take 325 mg by mouth daily with breakfast.    . HYDROcodone-acetaminophen (NORCO/VICODIN) 5-325 MG tablet Take 1 tablet by mouth every 6 (six) hours as needed (pain). Reported on 10/28/2015    . lactose free nutrition (BOOST) LIQD Take 237 mLs by mouth daily as needed.    Marland Kitchen lisinopril (PRINIVIL,ZESTRIL) 10 MG tablet Take 10 mg by mouth daily.    . metFORMIN (GLUCOPHAGE) 500 MG tablet Take 500 mg by mouth daily with breakfast.    . pantoprazole (PROTONIX) 40 MG tablet Take 1 tablet (40 mg total) by mouth 2 (two) times daily. 60 tablet 0    Home: Home Living Family/patient expects to be discharged to:: Inpatient rehab Living Arrangements: Spouse/significant other (lived with spouse until 3 months ago. Now two daughters stay) Available Help at Discharge: Family, Available 24 hours/day Type of Home: House Home Access: Stairs to enter [employer] of Steps: 1 Entrance Stairs-Rails: None Home Layout: One level Bathroom Shower/Tub: Tub/shower unit, Architectural technologist: Standard Bathroom Accessibility: Yes Additional Comments: pt lives in a home with her children in which someone is always there. Pt was using a RW PTA due to R LE pain  Lives With: Spouse (two daughters, [name] and Ivin Booty, rotate staying with her to)   Functional History: Prior Function Level of Independence: Independent with assistive device(s) Comments: pt was indep PTA but needed RW due to R LE pain  Functional Status:  Mobility: Bed Mobility Overal bed mobility: Needs Assistance, +2 for physical assistance Bed Mobility: Supine to Sit Supine to sit: Mod assist, +2 for physical assistance General bed mobility comments: Mod +2 assist to scoot hips EOB with bed pad and for trunk support to come to full sitting position. Verbal cues for hand placement and sequencing through tasks. Transfers Overall transfer level: Needs assistance Equipment used:  Rolling walker (2 wheeled) Transfers: Sit to/from Stand, [employer] Transfers Sit to Stand: Mod assist, +2 physical assistance Stand pivot transfers: Mod assist, +2 physical assistance General transfer comment: Mod +2 assist for boost to stand, stabilize balance, and to pivot hips. Verbal cues for proper hand placement and to stand up straight to facilitate hip extension during transfers. Ambulation/Gait General Gait Details: pt unable to tolerate at this time    ADL: ADL Overall ADL's : Needs assistance/impaired Grooming: Wash/dry hands, Wash/dry face, Set up, Sitting Upper Body Bathing: Set up, Sitting Lower Body Bathing: Set up, Sitting/lateral leans Lower Body Bathing Details (indicate cue type and reason): Sitting on BSC, pt propped L leg up on chair and was able to weight shift laterally to complete Upper Body Dressing : Set up, Sitting Lower Body Dressing: Set up, Sit to/from stand Lower Body Dressing Details (indicate cue type and reason): Propped L leg on recliner and leaned onto [facility] handle to don sock Toilet Transfer: Moderate assistance, +2 for physical assistance, Stand-pivot, [facility] Toilet Transfer Details (indicate cue type  and reason): Cues to pivot or twist foot, pt unable to hop Toileting- Clothing Manipulation and Hygiene: Minimal assistance, Sitting/lateral lean Toileting - Clothing Manipulation Details (indicate cue type and reason): Able to complete pericare same way as LB bathing, required assist for clothing manipulation due to requiring bilateral UE support to maintain upright standing position safely. Functional mobility during ADLs: Moderate assistance, +2 for physical assistance  Cognition: Cognition Overall Cognitive Status: Within Functional Limits for tasks assessed Orientation Level: Oriented X4 Cognition Arousal/Alertness: Lethargic Behavior During Therapy: WFL for tasks assessed/performed Overall Cognitive Status: Within Functional Limits for tasks  assessed    Blood pressure 136/46, pulse 79, temperature 97.1 F (36.2 C), temperature source Oral, resp. rate 18, SpO2 100 %. Physical Exam  Nursing note and vitals reviewed. Constitutional: She is oriented to person, place, and time. She appears well-developed and well-nourished.  HENT:  Head: Normocephalic and atraumatic.  Mouth/Throat: Oropharynx is clear and moist.  Eyes: Conjunctivae are normal. Pupils are equal, round, and reactive to light.  Neck: Normal range of motion. Neck supple.  Cardiovascular: Normal rate and regular rhythm.   No murmur heard. Respiratory: Effort normal and breath sounds normal. No stridor. No respiratory distress. She has no wheezes.  GI: Soft. Bowel sounds are normal. She exhibits no distension. There is no tenderness. There is no guarding.  Musculoskeletal: She exhibits edema and tenderness.  LLE with well healed old surgical incisions.  R-AKA  Neurological: She is alert and oriented to person, place, and time.  Labile at times but able to follow commands without difficulty. Speech clear.  Motor: B/l UE 5/5 LLE hip flexion 4-/5, knee extension 4/5, ankle dorsi/plantar flexion 4+/5 Right hip flexion 3+/5 (pain inhibition)   Skin: Skin is warm and dry.  Psychiatric: Her speech is normal. Thought content normal. Her affect is blunt and labile. Cognition and memory are normal.    Results for orders placed or performed during the hospital encounter of 12/23/15 (from the past 48 hour(s))  CBC     Status: Abnormal   Collection Time: 12/25/15  6:07 AM  Result Value Ref Range   WBC 10.3 4.0 - 10.5 K/uL   RBC 3.37 (L) 3.87 - 5.11 MIL/uL   Hemoglobin 7.8 (L) 12.0 - 15.0 g/dL   HCT 25.2 (L) 36.0 - 46.0 %   MCV 74.8 (L) 78.0 - 100.0 fL   MCH 23.1 (L) 26.0 - 34.0 pg   MCHC 31.0 30.0 - 36.0 g/dL   RDW 23.1 (H) 11.5 - 15.5 %   Platelets 537 (H) 150 - 400 K/uL    Comment: PLATELET COUNT CONFIRMED BY SMEAR  Basic metabolic panel     Status: Abnormal    Collection Time: 12/25/15  6:07 AM  Result Value Ref Range   Sodium 136 135 - 145 mmol/L   Potassium 4.3 3.5 - 5.1 mmol/L   Chloride 102 101 - 111 mmol/L   CO2 25 22 - 32 mmol/L   Glucose, Bld 119 (H) 65 - 99 mg/dL   BUN 9 6 - 20 mg/dL   Creatinine, Ser 0.57 0.44 - 1.00 mg/dL   Calcium 8.9 8.9 - 10.3 mg/dL   GFR calc non Af Amer >60 >60 mL/min   GFR calc Af Amer >60 >60 mL/min    Comment: (NOTE) The eGFR has been calculated using the CKD EPI equation. This calculation has not been validated in all clinical situations. eGFR's persistently <60 mL/min signify possible Chronic Kidney Disease.    Anion gap 9  5 - 15   No results found.     Medical Problem List and Plan: 1.  Weakness, abnormality of gait, pain secondary to R-AKA 2.  DVT Prophylaxis/Anticoagulation: Pharmaceutical: Lovenox 3. Pain Management: Continue oxycodone prn.  4. Mood: Team to provide ego support. LCSW to follow for evaluation and support.  5. Neuropsych: This patient is capable of making decisions on her own behalf. 6. Skin/Wound Care: Monitor wound for healing daily.  7. Fluids/Electrolytes/Nutrition: Monitor I/O. Continue  boost for supplements and to promote wound healing.  8. HTN: Monitor BP bid. Continue Norvasc and lisinopril  9. Recent subacute GIB: Had negative work up. Monitor H/H with serial checks. Monitor for recurrent melena/GIB. Continue Protonix BID.  10. Acute on chronic anemia: Increase iron to bid.   11. Heme positive stool: Monitor for melena or hematochezia.    13.  DMT2: Monitor BS with ac/hs checks.  Continue metformin 500 mg bid.  14. Thrombocytosis: Likely reactive. Continue to monitor.    Post Admission Physician Evaluation: 1. Functional deficits secondary  to  R-AKA. 2. Patient is admitted to receive collaborative, interdisciplinary care between the physiatrist, rehab nursing staff, and therapy team. 3. Patient's level of medical complexity and substantial therapy needs in  context of that medical necessity cannot be provided at a lesser intensity of care such as a SNF. 4. Patient has experienced substantial functional loss from his/her baseline which was documented above under the "Functional History" and "Functional Status" headings.  Judging by the patient's diagnosis, physical exam, and functional history, the patient has potential for functional progress which will result in measurable gains while on inpatient rehab.  These gains will be of substantial and practical use upon discharge  in facilitating mobility and self-care at the household level. 5. Physiatrist will provide 24 hour management of medical needs as well as oversight of the therapy plan/treatment and provide guidance as appropriate regarding the interaction of the two. 6. 24 hour rehab nursing will assist with bladder management, safety, skin/wound care, disease management, medication administration, pain management and patient education and help integrate therapy concepts, techniques,education, etc. 7. PT will assess and treat for/with: Lower extremity strength, range of motion, stamina, balance, functional mobility, safety, adaptive techniques and equipment, woundcare, coping skills, pain control, pre-prosthetic education.   Goals are: supervision/Min A. 8. OT will assess and treat for/with: ADL's, functional mobility, safety, upper extremity strength, adaptive techniques and equipment, wound mgt, ego support, and community reintegration.   Goals are: supervision/MinA. Therapy may not proceed with showering this patient. 9. Case Management and Social Worker will assess and treat for psychological issues and discharge planning. 10. Team conference will be held weekly to assess progress toward goals and to determine barriers to discharge. 11. Patient will receive at least 3 hours of therapy per day at least 5 days per week. 12. ELOS: 10-14 days.        13. Prognosis:  good  Delice Lesch, MD 12/26/2015

## 2015-12-26 NOTE — Progress Notes (Signed)
Jessica Gong, RN Rehab Admission Coordinator Signed Physical Medicine and Rehabilitation PMR Pre-admission 12/26/2015 1:34 PM  Related encounter: Admission (Current) from 12/23/2015 in [facility]   PMR Admission Coordinator Pre-Admission Assessment  Patient: Jessica Abbott is an 75 y.o., female MRN: [id number] DOB: 1941-04-02 Height:   Weight:    Insurance Information HMO: PPO: PCP: IPA: 80/20: yes OTHER: no HMO PRIMARY: Medicare a and b Policy#: [id number] Subscriber: pt Benefits: Phone #: passport one Name: 12/26/2015 Eff. Date: 07/18/2006 Deduct: $1316 Out of Pocket Max: none Life Max: none CIR: 100% SNF: 20 full days Outpatient: 80% Co-Pay: 20% Home Health: 100% Co-Pay: none DME: 80% Co-Pay: 20% Providers: pt choice  SECONDARY: none    Medicaid Application Date: Case Manager:  Disability Application Date: Case Worker:   Emergency Contact Information Contact Information    Name Relation Home Work Jessica Abbott [phone]     Alycia Rossetti Abbott [phone]       Current Medical History  Patient Admitting Diagnosis: Right AKA  History of Present Illness: Jessica Abbott is a 75 y.o. female with history of HTN, DMT2, CKD, pulmonary HTN, chronic right knee contracture, PVD s/p left femoral to dorsalis pedis bypass who developed right foot wound and superficial occlusion but declined AKA and anemia due to subacute GIB 11/2015 (negative EGD/colonoscopy). 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 Scot Dock. Hospital course complicated by acute on chronic anemia noted with heme positive stool, pain  management. Denies associated phantom limb pain.   Past Medical History  Past Medical History  Diagnosis Date  . Hypertension   . Peripheral vascular disease ([place])   . Diabetes mellitus without complication ([place])   . Arthritis   . Peripheral arterial disease ([place])   . Chronic kidney disease   . SOB (shortness of breath)   . History of pneumonia   . History of bronchitis   . Stress incontinence   . Anemia   . History of blood transfusion   . Pulmonary hypertension ([place])     [id number] echo: PA systolic pressure severely increaed, PA peak pressure 66 mm Hg.   Marland Kitchen Aortic regurgitation     moderate AR by 11/25/15 echo    Family History  family history includes Heart disease in her father.  Prior Rehab/Hospitalizations:  Has the patient had major surgery during 100 days prior to admission? Yes  Current Medications   Current facility-administered medications:  . acetaminophen (TYLENOL) tablet 325-650 mg, 325-650 mg, Oral, Q4H PRN, 650 mg at 12/24/15 2310 **OR** acetaminophen (TYLENOL) suppository 325-650 mg, 325-650 mg, Rectal, Q4H PRN, Jessica Mould, MD . albuterol (PROVENTIL) (2.5 MG/3ML) 0.083% nebulizer solution 2.5 mg, 2.5 mg, Nebulization, Q6H PRN, Jessica Mould, MD . alum & mag hydroxide-simeth (MAALOX/MYLANTA) 200-200-20 MG/5ML suspension 15-30 mL, 15-30 mL, Oral, Q2H PRN, Jessica Mould, MD . amLODipine (NORVASC) tablet 5 mg, 5 mg, Oral, Daily, Jessica Mould, MD, 5 mg at 12/26/15 [id number] . aspirin EC tablet 81 mg, 81 mg, Oral, Daily, Jessica Mould, MD, 81 mg at 12/26/15 0902 . baclofen (LIORESAL) tablet 5 mg, 5 mg, Oral, BID, Jessica Mould, MD, 5 mg at 12/26/15 0902 . Chlorhexidine Gluconate Cloth 2 % PADS 6 each, 6 each, Topical, Q0600, Jessica Mould, MD . cholecalciferol (VITAMIN D) tablet 2,000 Units, 2,000 Units, Oral, Daily, Jessica Mould, MD, 2,000 Units  at  12/26/15 0902 . dextrose 5 % and 0.45 % NaCl with KCl 20 mEq/L infusion, , Intravenous, Continuous, Jessica Mould, MD, Last Rate: 75 mL/hr at 12/24/15 0222 . docusate sodium (COLACE) capsule 100 mg, 100 mg, Oral, Daily PRN, Jessica Mould, MD . docusate sodium (COLACE) capsule 100 mg, 100 mg, Oral, Daily, Jessica Mould, MD, 100 mg at 12/26/15 0903 . enoxaparin (LOVENOX) injection 40 mg, 40 mg, Subcutaneous, Q24H, Jessica Mould, MD, 40 mg at 12/26/15 0903 . ferrous sulfate tablet 325 mg, 325 mg, Oral, Q breakfast, Jessica Mould, MD, 325 mg at 12/26/15 0903 . guaiFENesin-dextromethorphan (ROBITUSSIN DM) 100-10 MG/5ML syrup 15 mL, 15 mL, Oral, Q4H PRN, Jessica Mould, MD . hydrALAZINE (APRESOLINE) injection 5 mg, 5 mg, Intravenous, Q20 Min PRN, Jessica Mould, MD . HYDROcodone-acetaminophen (NORCO/VICODIN) 5-325 MG per tablet 1 tablet, 1 tablet, Oral, Q6H PRN, Jessica Mould, MD, 1 tablet at 12/24/15 0450 . HYDROmorphone (DILAUDID) injection 0.5-1 mg, 0.5-1 mg, Intravenous, Q2H PRN, Jessica Mould, MD, 1 mg at 12/25/15 2109 . labetalol (NORMODYNE,TRANDATE) injection 10 mg, 10 mg, Intravenous, Q10 min PRN, Jessica Mould, MD . lactated ringers infusion, , Intravenous, Continuous, Albertha Ghee, MD, Last Rate: 10 mL/hr at 12/23/15 0753 . lactose free nutrition (Boost) liquid 237 mL, 237 mL, Oral, TID BM, Jessica Mould, MD, 237 mL at 12/26/15 0904 . lisinopril (PRINIVIL,ZESTRIL) tablet 10 mg, 10 mg, Oral, Daily, Jessica Mould, MD, 10 mg at 12/26/15 0903 . magnesium sulfate IVPB 2 g 50 mL, 2 g, Intravenous, Once PRN, Jessica Mould, MD . metFORMIN (GLUCOPHAGE) tablet 500 mg, 500 mg, Oral, Q breakfast, Jessica Mould, MD, 500 mg at 12/26/15 0902 . metoprolol (LOPRESSOR) injection 2-5 mg, 2-5 mg, Intravenous, Q2H PRN, Jessica Mould, MD . ondansetron [facility]) injection 4 mg, 4  mg, Intravenous, Q6H PRN, Jessica Mould, MD . oxyCODONE-acetaminophen (PERCOCET/ROXICET) 5-325 MG per tablet 1-2 tablet, 1-2 tablet, Oral, Q4H PRN, Jessica Mould, MD, 2 tablet at 12/26/15 1129 . pantoprazole (PROTONIX) EC tablet 40 mg, 40 mg, Oral, BID, Jessica Mould, MD, 40 mg at 12/26/15 0902 . phenol (CHLORASEPTIC) mouth spray 1 spray, 1 spray, Mouth/Throat, PRN, Jessica Mould, MD . potassium chloride SA (K-DUR,KLOR-CON) CR tablet 20-40 mEq, 20-40 mEq, Oral, Once PRN, Jessica Mould, MD  Facility-Administered Medications Ordered in Other Encounters:  . 6 CHG cloth bath night before surgery, , , Once **AND** 6 CHG cloth bath AM of surgery, , , Once **AND** Chlorhexidine Gluconate Cloth 2 % PADS 6 each, 6 each, Topical, Once **AND** Chlorhexidine Gluconate Cloth 2 % PADS 6 each, 6 each, Topical, Once, Serafina Mitchell, MD  Patients Current Diet: Diet Carb Modified Fluid consistency:: Thin; Room service appropriate?: Yes  Precautions / Restrictions Precautions Precautions: Fall Precaution Comments: new R AKA Restrictions Weight Bearing Restrictions: Yes RLE Weight Bearing: Non weight bearing   Has the patient had 2 or more falls or a fall with injury in the past year?No  Prior Activity Level Limited Community (1-2x/wk): limited over past 3 months due to RLE issues. Did not drive before that Had been to SNF for 20 days 04/2015  Home Assistive Devices / Equipment    Prior Device Use: Indicate devices/aids used by the patient prior to current illness, exacerbation or injury? Walker  Prior Functional Level Prior Function Level of Independence: Independent with assistive device(s) Comments: pt was indep PTA but needed RW due to R LE pain Declining function over past  3 months so two daughters have been providing 24/7 assist. Pt was hopping with RW short distances  Self Care: Did the patient need help bathing, dressing, using the toilet or  eating? Needed some help  Indoor Mobility: Did the patient need assistance with walking from room to room (with or without device)? Needed some help  Stairs: Did the patient need assistance with internal or external stairs (with or without device)? Needed some help  Functional Cognition: Did the patient need help planning regular tasks such as shopping or remembering to take medications? Independent  Current Functional Level Cognition  Overall Cognitive Status: Within Functional Limits for tasks assessed Orientation Level: Oriented X4   Extremity Assessment (includes Sensation/Coordination)  Upper Extremity Assessment: Generalized weakness  Lower Extremity Assessment: RLE deficits/detail, LLE deficits/detail RLE Deficits / Details: AKA LLE Deficits / Details: generalized weakness    ADLs  Overall ADL's : Needs assistance/impaired Grooming: Wash/dry hands, Wash/dry face, Set up, Sitting Upper Body Bathing: Set up, Sitting Lower Body Bathing: Set up, Sitting/lateral leans Lower Body Bathing Details (indicate cue type and reason): Sitting on BSC, pt propped L leg up on chair and was able to weight shift laterally to complete Upper Body Dressing : Set up, Sitting Lower Body Dressing: Set up, Sit to/from stand Lower Body Dressing Details (indicate cue type and reason): Propped L leg on recliner and leaned onto [facility] handle to don sock Toilet Transfer: Moderate assistance, +2 for physical assistance, Stand-pivot, BSC Toilet Transfer Details (indicate cue type and reason): Cues to pivot or twist foot, pt unable to hop Toileting- Clothing Manipulation and Hygiene: Minimal assistance, Sitting/lateral lean Toileting - Clothing Manipulation Details (indicate cue type and reason): Able to complete pericare same way as LB bathing, required assist for clothing manipulation due to requiring bilateral UE support to maintain upright standing position safely. Functional mobility during ADLs:  Moderate assistance, +2 for physical assistance    Mobility  Overal bed mobility: Needs Assistance, +2 for physical assistance Bed Mobility: Supine to Sit Supine to sit: Mod assist, +2 for physical assistance General bed mobility comments: Mod +2 assist to scoot hips EOB with bed pad and for trunk support to come to full sitting position. Verbal cues for hand placement and sequencing through tasks.    Transfers  Overall transfer level: Needs assistance Equipment used: Rolling walker (2 wheeled) Transfers: Sit to/from Stand, [employer] Transfers Sit to Stand: Mod assist, +2 physical assistance Stand pivot transfers: Mod assist, +2 physical assistance General transfer comment: Mod +2 assist for boost to stand, stabilize balance, and to pivot hips. Verbal cues for proper hand placement and to stand up straight to facilitate hip extension during transfers.    Ambulation / Gait / Stairs / Wheelchair Mobility  Ambulation/Gait General Gait Details: pt unable to tolerate at this time    Posture / Balance Dynamic Sitting Balance Sitting balance - Comments: Min assist initially for sitting balance - pt with posterior lean. Able to maintain with min guard assist after 3 minutes sitting EOB. Balance Overall balance assessment: Needs assistance Sitting-balance support: No upper extremity supported, Feet supported Sitting balance-Leahy Scale: Fair Sitting balance - Comments: Min assist initially for sitting balance - pt with posterior lean. Able to maintain with min guard assist after 3 minutes sitting EOB. Postural control: Posterior lean Standing balance support: Bilateral upper extremity supported, During functional activity Standing balance-Leahy Scale: Poor Standing balance comment: Heavy reliance on bilateral UE support to achieve standing position - even still pt in hip  extension and forward head position despite mod verbal cues for proper positioning during transfers.      Special needs/care consideration Skin surgical incision and dressing bowel mgmt: LBM 4/6  Bladder mgmt: some incontinence Diabetic mgmt yes Contact Precautions MRSA   Previous Home Environment Living Arrangements: Spouse/significant other (lived with spouse until 3 months ago. Now two daughters stay) Lives With: Spouse (two daughters, [name] and Ivin Booty, rotate staying with her to) Available Help at Discharge: Family, Available 24 hours/day Type of Home: House Home Layout: One level Home Access: Stairs to enter Entrance Stairs-Rails: None Entrance Stairs-Number of Steps: 1 Bathroom Shower/Tub: Public librarian, Architectural technologist: Standard Bathroom Accessibility: Yes How Accessible: Accessible via walker Penelope: No Additional Comments: pt lives in a home with her children in which someone is always there. Pt was using a RW PTA due to R LE pain Pt states her spouse lives in the home but are seperated. He uses a RW and is Mod I.(CVA). She assisted him until December 2016 when she required assist from her two daughters  Discharge Living Setting Plans for Discharge Living Setting: Patient's home, Lives with (comment) (lives with spouse but he is self sufficient on RW. Pt was hi) Type of Home at Discharge: House Discharge Home Layout: One level Discharge Home Access: Stairs to enter Entrance Stairs-Rails: None Entrance Stairs-Number of Steps: 1 Discharge Bathroom Shower/Tub: Tub/shower unit, Curtain Discharge Bathroom Toilet: Standard Discharge Bathroom Accessibility: Yes How Accessible: Accessible via walker Does the patient have any problems obtaining your medications?: No  Social/Family/Support Systems Patient Roles: Spouse, Parent Contact Information: Abelina Bachelor, daughters Anticipated Caregiver: [name] and SHaron Anticipated Caregiver's Contact Information: see above Ability/Limitations of Caregiver: they rotate with their work to provide for  pt Caregiver Availability: 24/7 Discharge Plan Discussed with Primary Caregiver: Yes Is Caregiver In Agreement with Plan?: Yes Does Caregiver/Family have Issues with Lodging/Transportation while Pt is in Rehab?: No  Goals/Additional Needs Patient/Family Goal for Rehab: Mod I to superivision with PT and OT Expected length of stay: ELOS 10- 14 days Pt/Family Agrees to Admission and willing to participate: Yes Program Orientation Provided & Reviewed with Pt/Caregiver Including Roles & Responsibilities: Yes  Decrease burden of Care through IP rehab admission: n/a  Possible need for SNF placement upon discharge: not anticipated  Patient Condition: This patient's condition remains as documented in the consult dated 12/26/2015, in which the Rehabilitation Physician determined and documented that the patient's condition is appropriate for intensive rehabilitative care in an inpatient rehabilitation facility. Will admit to inpatient rehab today.  Preadmission Screen Completed By: Cleatrice Burke, 12/26/2015 1:50 PM ______________________________________________________________________  Discussed status with Dr. Posey Pronto on 12/26/2015 at 1349 and received telephone approval for admission today.  Admission Coordinator: Cleatrice Burke, time [id number] Date 12/26/2015          Cosigned by: [name], MD at 12/26/2015 1:57 PM  Revision History     Date/Time User Provider Type Action   12/26/2015 1:57 PM [name], MD Physician Cosign   12/26/2015 1:54 PM Jessica Gong, RN Rehab Admission Coordinator Sign   12/26/2015 1:51 PM Jessica Gong, RN Rehab Admission Coordinator Sign   View Details Report

## 2015-12-26 NOTE — PMR Pre-admission (Signed)
PMR Admission Coordinator Pre-Admission Assessment  Patient: Jessica Abbott is an 75 y.o., female MRN: [id number] DOB: 06-05-1941 Height:   Weight:                Insurance Information HMO:     PPO:      PCP:      IPA:      80/20: yes     OTHER: no HMO PRIMARY: Medicare a and b      Policy#: [id number]      Subscriber: pt Benefits:  Phone #: passport one     Name: 12/26/2015 Eff. Date: 07/18/2006     Deduct: $1316      Out of Pocket Max: none      Life Max: none CIR: 100%      SNF: 20 full days Outpatient: 80%     Co-Pay: 20% Home Health: 100%      Co-Pay: none DME: 80%     Co-Pay: 20% Providers: pt choice  SECONDARY: none        Medicaid Application Date:       Case Manager:  Disability Application Date:       Case Worker:   Emergency Contact Information Contact Information    Name Relation Home Work Central Aguirre Daughter [phone]     Alycia Rossetti Daughter [phone]       Current Medical History  Patient Admitting Diagnosis: Right AKA  History of Present Illness: Jessica Abbott is a 75 y.o. female with history of HTN, DMT2, CKD, pulmonary HTN, chronic right knee contracture, PVD s/p left femoral to dorsalis pedis bypass who developed right foot wound and superficial occlusion but declined AKA and anemia due to subacute GIB 11/2015 (negative EGD/colonoscopy).  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 Scot Dock. Hospital course complicated by acute on chronic anemia noted with heme positive stool, pain management. Denies associated phantom limb pain.   Past Medical History  Past Medical History  Diagnosis Date  . Hypertension   . Peripheral vascular disease ([place])   . Diabetes mellitus without complication ([place])   . Arthritis   . Peripheral arterial disease ([place])   . Chronic kidney disease   . SOB (shortness of breath)   . History of pneumonia   . History of bronchitis   . Stress incontinence   . Anemia    . History of blood transfusion   . Pulmonary hypertension ([place])     Q000111Q echo: PA systolic pressure severely increaed, PA peak pressure 66 mm Hg.   Marland Kitchen Aortic regurgitation     moderate AR by 11/25/15 echo    Family History  family history includes Heart disease in her father.  Prior Rehab/Hospitalizations:  Has the patient had major surgery during 100 days prior to admission? Yes  Current Medications   Current facility-administered medications:  .  acetaminophen (TYLENOL) tablet 325-650 mg, 325-650 mg, Oral, Q4H PRN, 650 mg at 12/24/15 2310 **OR** acetaminophen (TYLENOL) suppository 325-650 mg, 325-650 mg, Rectal, Q4H PRN, Angelia Mould, MD .  albuterol (PROVENTIL) (2.5 MG/3ML) 0.083% nebulizer solution 2.5 mg, 2.5 mg, Nebulization, Q6H PRN, Angelia Mould, MD .  alum & mag hydroxide-simeth (MAALOX/MYLANTA) 200-200-20 MG/5ML suspension 15-30 mL, 15-30 mL, Oral, Q2H PRN, Angelia Mould, MD .  amLODipine (NORVASC) tablet 5 mg, 5 mg, Oral, Daily, Angelia Mould, MD, 5 mg at 12/26/15 [id number] .  aspirin EC tablet 81 mg, 81 mg, Oral, Daily, Harrell Gave  Nicole Cella, MD, 81 mg at 12/26/15 0902 .  baclofen (LIORESAL) tablet 5 mg, 5 mg, Oral, BID, Angelia Mould, MD, 5 mg at 12/26/15 0902 .  Chlorhexidine Gluconate Cloth 2 % PADS 6 each, 6 each, Topical, Q0600, Angelia Mould, MD .  cholecalciferol (VITAMIN D) tablet 2,000 Units, 2,000 Units, Oral, Daily, Angelia Mould, MD, 2,000 Units at 12/26/15 0902 .  dextrose 5 % and 0.45 % NaCl with KCl 20 mEq/L infusion, , Intravenous, Continuous, Angelia Mould, MD, Last Rate: 75 mL/hr at 12/24/15 0222 .  docusate sodium (COLACE) capsule 100 mg, 100 mg, Oral, Daily PRN, Angelia Mould, MD .  docusate sodium (COLACE) capsule 100 mg, 100 mg, Oral, Daily, Angelia Mould, MD, 100 mg at 12/26/15 0903 .  enoxaparin (LOVENOX) injection 40 mg, 40 mg, Subcutaneous, Q24H, Angelia Mould, MD,  40 mg at 12/26/15 0903 .  ferrous sulfate tablet 325 mg, 325 mg, Oral, Q breakfast, Angelia Mould, MD, 325 mg at 12/26/15 0903 .  guaiFENesin-dextromethorphan (ROBITUSSIN DM) 100-10 MG/5ML syrup 15 mL, 15 mL, Oral, Q4H PRN, Angelia Mould, MD .  hydrALAZINE (APRESOLINE) injection 5 mg, 5 mg, Intravenous, Q20 Min PRN, Angelia Mould, MD .  HYDROcodone-acetaminophen (NORCO/VICODIN) 5-325 MG per tablet 1 tablet, 1 tablet, Oral, Q6H PRN, Angelia Mould, MD, 1 tablet at 12/24/15 0450 .  HYDROmorphone (DILAUDID) injection 0.5-1 mg, 0.5-1 mg, Intravenous, Q2H PRN, Angelia Mould, MD, 1 mg at 12/25/15 2109 .  labetalol (NORMODYNE,TRANDATE) injection 10 mg, 10 mg, Intravenous, Q10 min PRN, Angelia Mould, MD .  lactated ringers infusion, , Intravenous, Continuous, Albertha Ghee, MD, Last Rate: 10 mL/hr at 12/23/15 0753 .  lactose free nutrition (Boost) liquid 237 mL, 237 mL, Oral, TID BM, Angelia Mould, MD, 237 mL at 12/26/15 0904 .  lisinopril (PRINIVIL,ZESTRIL) tablet 10 mg, 10 mg, Oral, Daily, Angelia Mould, MD, 10 mg at 12/26/15 0903 .  magnesium sulfate IVPB 2 g 50 mL, 2 g, Intravenous, Once PRN, Angelia Mould, MD .  metFORMIN (GLUCOPHAGE) tablet 500 mg, 500 mg, Oral, Q breakfast, Angelia Mould, MD, 500 mg at 12/26/15 0902 .  metoprolol (LOPRESSOR) injection 2-5 mg, 2-5 mg, Intravenous, Q2H PRN, Angelia Mould, MD .  ondansetron [facility]) injection 4 mg, 4 mg, Intravenous, Q6H PRN, Angelia Mould, MD .  oxyCODONE-acetaminophen (PERCOCET/ROXICET) 5-325 MG per tablet 1-2 tablet, 1-2 tablet, Oral, Q4H PRN, Angelia Mould, MD, 2 tablet at 12/26/15 1129 .  pantoprazole (PROTONIX) EC tablet 40 mg, 40 mg, Oral, BID, Angelia Mould, MD, 40 mg at 12/26/15 0902 .  phenol (CHLORASEPTIC) mouth spray 1 spray, 1 spray, Mouth/Throat, PRN, Angelia Mould, MD .  potassium chloride SA (K-DUR,KLOR-CON) CR tablet  20-40 mEq, 20-40 mEq, Oral, Once PRN, Angelia Mould, MD  Facility-Administered Medications Ordered in Other Encounters:  .  6 CHG cloth bath night before surgery, , , Once **AND** 6 CHG cloth bath AM of surgery, , , Once **AND** Chlorhexidine Gluconate Cloth 2 % PADS 6 each, 6 each, Topical, Once **AND** Chlorhexidine Gluconate Cloth 2 % PADS 6 each, 6 each, Topical, Once, Serafina Mitchell, MD  Patients Current Diet: Diet Carb Modified Fluid consistency:: Thin; Room service appropriate?: Yes  Precautions / Restrictions Precautions Precautions: Fall Precaution Comments: new R AKA Restrictions Weight Bearing Restrictions: Yes RLE Weight Bearing: Non weight bearing   Has the patient had 2 or more falls or a fall with injury in  the past year?No  Prior Activity Level Limited Community (1-2x/wk): limited over past 3 months due to RLE issues. Did not drive before that Had been to SNF for 20 days 04/2015  Home Assistive Devices / Equipment    Prior Device Use: Indicate devices/aids used by the patient prior to current illness, exacerbation or injury? Walker  Prior Functional Level Prior Function Level of Independence: Independent with assistive device(s) Comments: pt was indep PTA but needed RW due to R LE pain Declining function over past 3 months so two daughters have been providing 24/7 assist. Pt was hopping with RW short distances  Self Care: Did the patient need help bathing, dressing, using the toilet or eating?  Needed some help  Indoor Mobility: Did the patient need assistance with walking from room to room (with or without device)? Needed some help  Stairs: Did the patient need assistance with internal or external stairs (with or without device)? Needed some help  Functional Cognition: Did the patient need help planning regular tasks such as shopping or remembering to take medications? Independent  Current Functional Level Cognition  Overall Cognitive Status: Within  Functional Limits for tasks assessed Orientation Level: Oriented X4    Extremity Assessment (includes Sensation/Coordination)  Upper Extremity Assessment: Generalized weakness  Lower Extremity Assessment: RLE deficits/detail, LLE deficits/detail RLE Deficits / Details: AKA LLE Deficits / Details: generalized weakness    ADLs  Overall ADL's : Needs assistance/impaired Grooming: Wash/dry hands, Wash/dry face, Set up, Sitting Upper Body Bathing: Set up, Sitting Lower Body Bathing: Set up, Sitting/lateral leans Lower Body Bathing Details (indicate cue type and reason): Sitting on BSC, pt propped L leg up on chair and was able to weight shift laterally to complete Upper Body Dressing : Set up, Sitting Lower Body Dressing: Set up, Sit to/from stand Lower Body Dressing Details (indicate cue type and reason): Propped L leg on recliner and leaned onto [facility] handle to don sock Toilet Transfer: Moderate assistance, +2 for physical assistance, Stand-pivot, BSC Toilet Transfer Details (indicate cue type and reason): Cues to pivot or twist foot, pt unable to hop Toileting- Clothing Manipulation and Hygiene: Minimal assistance, Sitting/lateral lean Toileting - Clothing Manipulation Details (indicate cue type and reason): Able to complete pericare same way as LB bathing, required assist for clothing manipulation due to requiring bilateral UE support to maintain upright standing position safely. Functional mobility during ADLs: Moderate assistance, +2 for physical assistance    Mobility  Overal bed mobility: Needs Assistance, +2 for physical assistance Bed Mobility: Supine to Sit Supine to sit: Mod assist, +2 for physical assistance General bed mobility comments: Mod +2 assist to scoot hips EOB with bed pad and for trunk support to come to full sitting position. Verbal cues for hand placement and sequencing through tasks.    Transfers  Overall transfer level: Needs assistance Equipment used: Rolling  walker (2 wheeled) Transfers: Sit to/from Stand, [employer] Transfers Sit to Stand: Mod assist, +2 physical assistance Stand pivot transfers: Mod assist, +2 physical assistance General transfer comment: Mod +2 assist for boost to stand, stabilize balance, and to pivot hips. Verbal cues for proper hand placement and to stand up straight to facilitate hip extension during transfers.    Ambulation / Gait / Stairs / Wheelchair Mobility  Ambulation/Gait General Gait Details: pt unable to tolerate at this time    Posture / Balance Dynamic Sitting Balance Sitting balance - Comments: Min assist initially for sitting balance - pt with posterior lean. Able to maintain with  min guard assist after 3 minutes sitting EOB. Balance Overall balance assessment: Needs assistance Sitting-balance support: No upper extremity supported, Feet supported Sitting balance-Leahy Scale: Fair Sitting balance - Comments: Min assist initially for sitting balance - pt with posterior lean. Able to maintain with min guard assist after 3 minutes sitting EOB. Postural control: Posterior lean Standing balance support: Bilateral upper extremity supported, During functional activity Standing balance-Leahy Scale: Poor Standing balance comment: Heavy reliance on bilateral UE support to achieve standing position - even still pt in hip extension and forward head position despite mod verbal cues for proper positioning during transfers.    Special needs/care consideration Skin surgical incision and dressing bowel mgmt: LBM 4/6  Bladder mgmt: some incontinence Diabetic mgmt yes Contact Precautions MRSA   Previous Home Environment Living Arrangements: Spouse/significant other (lived with spouse until 3 months ago. Now two daughters stay)  Lives With: Spouse (two daughters, [name] and Ivin Booty, rotate staying with her to) Available Help at Discharge: Family, Available 24 hours/day Type of Home: House Home Layout: One level Home  Access: Stairs to enter Entrance Stairs-Rails: None Entrance Stairs-Number of Steps: 1 Bathroom Shower/Tub: Public librarian, Architectural technologist: Standard Bathroom Accessibility: Yes How Accessible: Accessible via walker [place]: No Additional Comments: pt lives in a home with her children in which someone is always there. Pt was using a RW PTA due to R LE pain Pt states her spouse lives in the home but are seperated. He uses a RW and is Mod I.(CVA). She assisted him until December 2016 when she required assist from her two daughters  Discharge Living Setting Plans for Discharge Living Setting: Patient's home, Lives with (comment) (lives with spouse but he is self sufficient on RW. Pt was hi) Type of Home at Discharge: House Discharge Home Layout: One level Discharge Home Access: Stairs to enter Entrance Stairs-Rails: None Entrance Stairs-Number of Steps: 1 Discharge Bathroom Shower/Tub: Tub/shower unit, Curtain Discharge Bathroom Toilet: Standard Discharge Bathroom Accessibility: Yes How Accessible: Accessible via walker Does the patient have any problems obtaining your medications?: No  Social/Family/Support Systems Patient Roles: Spouse, Parent Contact Information: Abelina Bachelor, daughters Anticipated Caregiver: [name] and SHaron Anticipated Caregiver's Contact Information: see above Ability/Limitations of Caregiver: they rotate with their work to provide for pt Caregiver Availability: 24/7 Discharge Plan Discussed with Primary Caregiver: Yes Is Caregiver In Agreement with Plan?: Yes Does Caregiver/Family have Issues with Lodging/Transportation while Pt is in Rehab?: No  Goals/Additional Needs Patient/Family Goal for Rehab: Mod I to superivision with PT and OT Expected length of stay: ELOS 10- 14 days Pt/Family Agrees to Admission and willing to participate: Yes Program Orientation Provided & Reviewed with Pt/Caregiver Including Roles  & Responsibilities:  Yes  Decrease burden of Care through IP rehab admission: n/a  Possible need for SNF placement upon discharge: not anticipated  Patient Condition: This patient's condition remains as documented in the consult dated 12/26/2015, in which the Rehabilitation Physician determined and documented that the patient's condition is appropriate for intensive rehabilitative care in an inpatient rehabilitation facility. Will admit to inpatient rehab today.  Preadmission Screen Completed By:  Cleatrice Burke, 12/26/2015 1:50 PM ______________________________________________________________________   Discussed status with Dr. Posey Pronto on 12/26/2015 at  1349  and received telephone approval for admission today.  Admission Coordinator:  Cleatrice Burke, time [id number] Date 12/26/2015

## 2015-12-26 NOTE — Consult Note (Deleted)
[facility] Discharge Summary  Jessica Abbott [birth year] 75 y.o. female  [id number]  Admission Date: 12/23/2015  Discharge Date: 12/26/2015  Physician: Angelia Mould, MD  Admission Diagnosis: Peripheral vascular disease with right toe ulcers I70.235  HPI:   This is a 75 y.o. female who Dr. Trula Slade  initially saw as a second opinion for right leg ischemia. She had previously undergone a left femoral to dorsalis pedis bypass graft and was sent [facility] for limb salvage. She had developed a new wound on her right foot. Angiography revealed superficial femoral-popliteal artery occlusion with single vessel runoff via the peroneal artery. She was offered an above-knee amputation and at [facility], but wanted to see if Dr. Trula Slade would consider a bypass. She was tentatively placed on the schedule for a femoral peroneal bypass and transmetatarsal amputation. In the interim, she was admitted to the hospital with a GI bleed with a hemoglobin of 3.5. Her foot had deteriorated and prior to discharge and Dr. Trula Slade had recommended a right above-knee amputation.   Hospital Course:  The patient was admitted to the hospital and taken to the operating room on 12/23/2015 and underwent: right above-the-knee amputation    The patient tolerated the procedure well and was transported to the PACU in stable condition.   POD 1: The patient was anemic pre-operatively. There was minimal blood loss intraoperatively. Her Hgb on POD 1 was 7.1. A guiac stool was ordered.   POD 2: Her dressing was taken down. Her amputation site was healing well. Her fecal occult blood was positive. She was advised to follow-up with Dr. Cristina Gong as an outpatient. Her Hgb improved.   POD 3: Daily dressing changes were started to her right amputation site. She was evaluated by CIR and was felt to be a good candidate. She was discharged to CIR on POD 3 in good condition.     CBC    Component Value Date/Time     WBC 10.3 12/25/2015 0607   RBC 3.37* 12/25/2015 0607   HGB 7.8* 12/25/2015 0607   HCT 25.2* 12/25/2015 0607   PLT 537* 12/25/2015 0607   MCV 74.8* 12/25/2015 0607   MCH 23.1* 12/25/2015 0607   MCHC 31.0 12/25/2015 0607   RDW 23.1* 12/25/2015 0607   LYMPHSABS 1.1 11/25/2015 0602   MONOABS 0.5 11/25/2015 0602   EOSABS 0.2 11/25/2015 0602   BASOSABS 0.0 11/25/2015 0602    BMET    Component Value Date/Time   NA 136 12/25/2015 0607   K 4.3 12/25/2015 0607   CL 102 12/25/2015 0607   CO2 25 12/25/2015 0607   GLUCOSE 119* 12/25/2015 0607   BUN 9 12/25/2015 0607   CREATININE 0.57 12/25/2015 0607   CALCIUM 8.9 12/25/2015 0607   GFRNONAA >60 12/25/2015 0607   GFRAA >60 12/25/2015 [id number]     Discharge Instructions:   The patient is discharged to CIR with extensive instructions on wound care and progressive ambulation.  They are instructed not to drive or perform any heavy lifting until returning to see the physician in his office.    Discharge Diagnosis:  Peripheral vascular disease with right toe ulcers I70.235  Secondary Diagnosis: Patient Active Problem List   Diagnosis Date Noted  . 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 Bleeding gastrointestinal   . Controlled diabetes mellitus type 2 with complications ([place])   . 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 PVD (peripheral vascular disease) ([place])   .  Acute blood loss anemia   . Gangrene of foot ([place])   . Gastrointestinal hemorrhage with melena   . Lactic acidosis   . Acute renal failure ([place])   . Acute GI bleeding 11/22/2015  . Upper GI bleed 11/22/2015   Past Medical History  Diagnosis Date  . Hypertension   . Peripheral vascular disease ([place])   . Diabetes mellitus without complication ([place])   . Arthritis   . Peripheral arterial disease ([place])   . Chronic kidney disease   . SOB (shortness of breath)   . History of pneumonia   . History of  bronchitis   . Stress incontinence   . Anemia   . History of blood transfusion   . Pulmonary hypertension ([place])     [id number] echo: PA systolic pressure severely increaed, PA peak pressure 66 mm Hg.   Marland Kitchen Aortic regurgitation     moderate AR by 11/25/15 echo       Medication List    ASK your doctor about these medications        acetaminophen 500 MG tablet  Commonly known as:  TYLENOL  Take 500 mg by mouth every 6 (six) hours as needed (pain).     albuterol 108 (90 Base) MCG/ACT inhaler  Commonly known as:  PROVENTIL HFA;VENTOLIN HFA  Inhale 2 puffs into the lungs every 6 (six) hours as needed for wheezing or shortness of breath.     amLODipine 5 MG tablet  Commonly known as:  NORVASC  Take 5 mg by mouth daily.     aspirin EC 81 MG tablet  Take 81 mg by mouth daily.     baclofen 10 MG tablet  Commonly known as:  LIORESAL  Take 0.5 tablets (5 mg total) by mouth 2 (two) times daily.     docusate sodium 100 MG capsule  Commonly known as:  COLACE  Take 100 mg by mouth daily as needed (constipation).     ferrous sulfate 325 (65 FE) MG tablet  Take 325 mg by mouth daily with breakfast.     HYDROcodone-acetaminophen 5-325 MG tablet  Commonly known as:  NORCO/VICODIN  Take 1 tablet by mouth every 6 (six) hours as needed (pain). Reported on 10/28/2015     lactose free nutrition Liqd  Take 237 mLs by mouth daily as needed.     lisinopril 10 MG tablet  Commonly known as:  PRINIVIL,ZESTRIL  Take 10 mg by mouth daily.     metFORMIN 500 MG tablet  Commonly known as:  GLUCOPHAGE  Take 500 mg by mouth daily with breakfast.     pantoprazole 40 MG tablet  Commonly known as:  PROTONIX  Take 1 tablet (40 mg total) by mouth 2 (two) times daily.     Vitamin D3 2000 units capsule  Take 2,000 Units by mouth daily.        Disposition: CIR  Patient's condition: is Good  Follow up: 1. Dr. Scot Dock in 4 weeks 2. Dr. Cristina Gong (GI)  Virgina Jock, PA-C [facility] [phone] 12/26/2015  1:56 PM

## 2015-12-26 NOTE — Progress Notes (Signed)
Physical Therapy Treatment Patient Details Name: Jessica Abbott MRN: [id number] DOB: 1940-10-10 Today's Date: 12/26/2015    History of Present Illness Pt is a 75 yo female s/p R AKA due to R LE ischemia. PMH: HTN, PVD, DM II, CKD, and arthritis.    PT Comments    Pt is progressing well with gait and mobility. She was able to hop a short distance in her room with RW.  She is still processing (emotionally) her loss of limb, but talks openly about it to staff.  Pt is hopeful to return to walking again with prosthesis in the future.  She continues to be appropriate for CIR level therapies at discharge.   Follow Up Recommendations  CIR     Equipment Recommendations  Wheelchair (measurements PT);Wheelchair cushion (measurements PT);Rolling walker with 5" wheels    Recommendations for Other Services   NA     Precautions / Restrictions Precautions Precautions: Fall Precaution Comments: new R AKA Restrictions RLE Weight Bearing: Non weight bearing    Mobility  Bed Mobility Overal bed mobility: Needs Assistance Bed Mobility: Supine to Sit     Supine to sit: Min assist;HOB elevated     General bed mobility comments: Min assist to help progress hips to EOB using draw pad only.  Pt able to move legs and grab to bed rail with both arms.   Transfers Overall transfer level: Needs assistance Equipment used: Rolling walker (2 wheeled) Transfers: Sit to/from Stand Sit to Stand: +2 physical assistance;Mod assist         General transfer comment: Two person mod assist to support trunk and stabilize RW to get to standing.  Verbal cues for safe hand placment, assist at trunk to power up and for balance during transition of hands from bed to RW.   Ambulation/Gait Ambulation/Gait assistance: +2 safety/equipment;Min assist Ambulation Distance (Feet): 10 Feet Assistive device: Rolling walker (2 wheeled) Gait Pattern/deviations: Step-to pattern (hop to) Gait velocity: decreased Gait  velocity interpretation: Below normal speed for age/gender General Gait Details: Pt able to take a few hop steps forward with RW with chair to follow for safety and to encourage increased gait distance.            Balance Overall balance assessment: Needs assistance Sitting-balance support: Feet supported;Bilateral upper extremity supported Sitting balance-Leahy Scale: Fair     Standing balance support: Bilateral upper extremity supported Standing balance-Leahy Scale: Poor                      Cognition Arousal/Alertness: Awake/alert Behavior During Therapy:  (tearful) Overall Cognitive Status: Within Functional Limits for tasks assessed                         General Comments General comments (skin integrity, edema, etc.): Continued education on self massage of right residual limb and education on phantom limb pain.       Pertinent Vitals/Pain Pain Assessment: Faces Faces Pain Scale: Hurts even more Pain Location: right residual limb Pain Descriptors / Indicators: Guarding;Grimacing Pain Intervention(s): Limited activity within patient's tolerance;Monitored during session;Repositioned           PT Goals (current goals can now be found in the care plan section) Acute Rehab PT Goals Patient Stated Goal: to go to rehab then go home Progress towards PT goals: Progressing toward goals    Frequency  Min 4X/week    PT Plan Current plan remains appropriate  End of Session Equipment Utilized During Treatment: Gait belt Activity Tolerance: Patient tolerated treatment well Patient left: in chair;with call bell/phone within reach     Time: 1015-1036 PT Time Calculation (min) (ACUTE ONLY): 21 min  Charges:  $Therapeutic Activity: 8-22 mins                      Danene Montijo B. [place], [place], DPT [phone]   12/26/2015, 5:32 PM

## 2015-12-26 NOTE — Discharge Summary (Signed)
[facility] Discharge Summary  Jessica Abbott 03-Jan-1941 75 y.o. female  [id number]  Admission Date: 12/23/2015  Discharge Date: 12/26/2015  Physician: Angelia Mould, MD  Admission Diagnosis: Peripheral vascular disease with right toe ulcers I70.235  HPI:   This is a 75 y.o. female who Dr. Trula Slade  initially saw as a second opinion for right leg ischemia. She had previously undergone a left femoral to dorsalis pedis bypass graft and was sent [place] for limb salvage. She had developed a new wound on her right foot. Angiography revealed superficial femoral-popliteal artery occlusion with single vessel runoff via the peroneal artery. She was offered an above-knee amputation and at [facility], but wanted to see if Dr. Trula Slade would consider a bypass. She was tentatively placed on the schedule for a femoral peroneal bypass and transmetatarsal amputation. In the interim, she was admitted to the hospital with a GI bleed with a hemoglobin of 3.5. Her foot had deteriorated and prior to discharge and Dr. Trula Slade had recommended a right above-knee amputation.   Hospital Course:  The patient was admitted to the hospital and taken to the operating room on 12/23/2015 and underwent: right above-the-knee amputation    The patient tolerated the procedure well and was transported to the PACU in stable condition.   POD 1: The patient was anemic pre-operatively. There was minimal blood loss intraoperatively. Her Hgb on POD 1 was 7.1. A guiac stool was ordered.   POD 2: Her dressing was taken down. Her amputation site was healing well. Her fecal occult blood was positive. She was advised to follow-up with Dr. Cristina Gong as an outpatient. Her Hgb improved.   POD 3: Daily dressing changes were started to her right amputation site. She was evaluated by CIR and was felt to be a good candidate. She was discharged to CIR on POD 3 in good condition.     CBC    Component Value Date/Time     WBC 10.3 12/25/2015 0607   RBC 3.37* 12/25/2015 0607   HGB 7.8* 12/25/2015 0607   HCT 25.2* 12/25/2015 0607   PLT 537* 12/25/2015 0607   MCV 74.8* 12/25/2015 0607   MCH 23.1* 12/25/2015 0607   MCHC 31.0 12/25/2015 0607   RDW 23.1* 12/25/2015 0607   LYMPHSABS 1.1 11/25/2015 0602   MONOABS 0.5 11/25/2015 0602   EOSABS 0.2 11/25/2015 0602   BASOSABS 0.0 11/25/2015 0602    BMET    Component Value Date/Time   NA 136 12/25/2015 0607   K 4.3 12/25/2015 0607   CL 102 12/25/2015 0607   CO2 25 12/25/2015 0607   GLUCOSE 119* 12/25/2015 0607   BUN 9 12/25/2015 0607   CREATININE 0.57 12/25/2015 0607   CALCIUM 8.9 12/25/2015 0607   GFRNONAA >60 12/25/2015 0607   GFRAA >60 12/25/2015 [id number]     Discharge Instructions:   The patient is discharged to CIR with extensive instructions on wound care and progressive ambulation.  They are instructed not to drive or perform any heavy lifting until returning to see the physician in his office.    Discharge Diagnosis:  Peripheral vascular disease with right toe ulcers I70.235  Secondary Diagnosis: Patient Active Problem List   Diagnosis Date Noted  . Atherosclerosis of native arteries of extremities with gangrene, right leg ([place]) 12/23/2015  . Bleeding gastrointestinal   . Controlled diabetes mellitus type 2 with complications ([place])   . 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 PVD (peripheral vascular disease) ([place])   .  Acute blood loss anemia   . Gangrene of foot ([place])   . Gastrointestinal hemorrhage with melena   . Lactic acidosis   . Acute renal failure ([place])   . Acute GI bleeding 11/22/2015  . Upper GI bleed 11/22/2015   Past Medical History  Diagnosis Date  . Hypertension   . Peripheral vascular disease ([place])   . Diabetes mellitus without complication ([employer])   . Arthritis   . Peripheral arterial disease ([place])   . Chronic kidney disease   . SOB (shortness of breath)   . History of pneumonia   . History of  bronchitis   . Stress incontinence   . Anemia   . History of blood transfusion   . Pulmonary hypertension ([place])     [id number] echo: PA systolic pressure severely increaed, PA peak pressure 66 mm Hg.   Marland Kitchen Aortic regurgitation     moderate AR by 11/25/15 echo       Medication List    ASK your doctor about these medications        acetaminophen 500 MG tablet  Commonly known as:  TYLENOL  Take 500 mg by mouth every 6 (six) hours as needed (pain).     albuterol 108 (90 Base) MCG/ACT inhaler  Commonly known as:  PROVENTIL HFA;VENTOLIN HFA  Inhale 2 puffs into the lungs every 6 (six) hours as needed for wheezing or shortness of breath.     amLODipine 5 MG tablet  Commonly known as:  NORVASC  Take 5 mg by mouth daily.     aspirin EC 81 MG tablet  Take 81 mg by mouth daily.     baclofen 10 MG tablet  Commonly known as:  LIORESAL  Take 0.5 tablets (5 mg total) by mouth 2 (two) times daily.     docusate sodium 100 MG capsule  Commonly known as:  COLACE  Take 100 mg by mouth daily as needed (constipation).     ferrous sulfate 325 (65 FE) MG tablet  Take 325 mg by mouth daily with breakfast.     HYDROcodone-acetaminophen 5-325 MG tablet  Commonly known as:  NORCO/VICODIN  Take 1 tablet by mouth every 6 (six) hours as needed (pain). Reported on 10/28/2015     lactose free nutrition Liqd  Take 237 mLs by mouth daily as needed.     lisinopril 10 MG tablet  Commonly known as:  PRINIVIL,ZESTRIL  Take 10 mg by mouth daily.     metFORMIN 500 MG tablet  Commonly known as:  GLUCOPHAGE  Take 500 mg by mouth daily with breakfast.     pantoprazole 40 MG tablet  Commonly known as:  PROTONIX  Take 1 tablet (40 mg total) by mouth 2 (two) times daily.     Vitamin D3 2000 units capsule  Take 2,000 Units by mouth daily.        Disposition: CIR  Patient's condition: is Good  Follow up: 1. Dr. Scot Dock in 4 weeks 2. Dr. Cristina Gong (GI)  Virgina Jock, PA-C [facility] [phone] 12/26/2015  1:56 PM

## 2015-12-26 NOTE — Progress Notes (Signed)
Pt ready for d/c to CIR per MD. Report was called to Dolores Lory, RN on 4MW. Pt's belongings gathered and sent with pt, her IV was left per request of 4W RN. Pt transported to new room via bed by [facility] staff. Prior to transfer, pt's dressing was changed per Dr. Nicole Cella order.  [place], Jerry Caras

## 2015-12-26 NOTE — Interval H&P Note (Signed)
Jessica Abbott was admitted today to Inpatient Rehabilitation with the diagnosis of R-AKA.  [name] history has been reviewed, patient examined, and there is no change in status.  Patient continues to be appropriate for intensive inpatient rehabilitation.  I have reviewed the patient's chart and labs.  Questions were answered to the patient's satisfaction. The PAPE has been reviewed and assessment remains appropriate.  Jessica Abbott 12/26/2015, 4:13 PM

## 2015-12-26 NOTE — Progress Notes (Signed)
I met with pt at bedside to discuss a possible inpt rehab admission. I then called her daughter, Gloria, and both are in agreement to admission. I will contact Dr. Dickson to clarify if I can admit pt to inpt rehab today. [phone] 

## 2015-12-26 NOTE — H&P (Signed)
Physical Medicine and Rehabilitation Admission H&P    CC : R-AKA   HPI:  Jessica Abbott is a 75 y.o. female with history of HTN, DMT2, CKD, pulmonary HTN, chronic right knee contracture, PVD s/p left femoral to dorsalis pedis bypass who developed right foot wound and superficial occlusion but declined AKA and anemia due to subacute GIB 11/2015 (negative EGD/colonoscopy). Mobility limited in past 1-2 months to few steps with walker and wheelchair due to RLE pain and ischemia. 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 Scot Dock. Hospital course complicated by acute on chronic anemia noted with heme positive stool, pain management. Denies associated phantom limb pain. Therapy evaluations done and CIR recommended to help patient regain prior level of independence.    Review of Systems  HENT: Negative for hearing loss.   Eyes: Negative for blurred vision and double vision.  Respiratory: Negative for cough and shortness of breath.   Cardiovascular: Positive for leg swelling. Negative for chest pain and palpitations.  Gastrointestinal: Negative for heartburn, nausea and abdominal pain.  Genitourinary: Negative for dysuria and urgency.  Musculoskeletal: Positive for myalgias and joint pain (chronic back pain).  Neurological: Negative for headaches.  Psychiatric/Behavioral: Negative for memory loss. The patient is nervous/anxious. The patient does not have insomnia.       Past Medical History  Diagnosis Date  . Hypertension   . Peripheral vascular disease ([place])   . Diabetes mellitus without complication ([place])   . Arthritis   . Peripheral arterial disease ([place])   . Chronic kidney disease   . SOB (shortness of breath)   . History of pneumonia   . History of bronchitis   . Stress incontinence   . Anemia   . History of blood transfusion   . Pulmonary hypertension ([place])     5/62/56 echo: PA systolic pressure severely increaed, PA peak  pressure 66 mm Hg.   Marland Kitchen Aortic regurgitation     moderate AR by 11/25/15 echo    Past Surgical History  Procedure Laterality Date  . Back surgery  2002  . Abdominal hysterectomy    . Femoral artery arteriogram  04/20/2015  . Esophagogastroduodenoscopy (egd) with propofol N/A 11/23/2015    Procedure: ESOPHAGOGASTRODUODENOSCOPY (EGD) WITH PROPOFOL;  Surgeon: Ronald Lobo, MD;  Location: [facility];  Service: Endoscopy;  Laterality: N/A;  . Colonoscopy N/A 11/24/2015    Procedure: COLONOSCOPY;  Surgeon: Ronald Lobo, MD;  Location[facility] ENDOSCOPY;  Service: Endoscopy;  Laterality: N/A;  . Givens capsule study N/A 11/26/2015    Procedure: GIVENS CAPSULE STUDY;  Surgeon: Ronald Lobo, MD;  Location: [facility];  Service: Endoscopy;  Laterality: N/A;    Family History  Problem Relation Age of Onset  . Heart disease Father     Social History:  Family has been living with her for past couple of months to help with housework. Independent for walking short distances with walker. She reports that she quit smoking about 1 year ago. She has never used smokeless tobacco. She reports that she does not drink alcohol or use illicit drugs.   Allergies: No Known Allergies    Medications Prior to Admission  Medication Sig Dispense Refill  . acetaminophen (TYLENOL) 500 MG tablet Take 500 mg by mouth every 6 (six) hours as needed (pain).    Marland Kitchen albuterol (PROVENTIL HFA;VENTOLIN HFA) 108 (90 Base) MCG/ACT inhaler Inhale 2 puffs into the lungs every 6 (six) hours as needed for wheezing or  shortness of breath.    Marland Kitchen amLODipine (NORVASC) 5 MG tablet Take 5 mg by mouth daily.    Marland Kitchen aspirin EC 81 MG tablet Take 81 mg by mouth daily.     . baclofen (LIORESAL) 10 MG tablet Take 0.5 tablets (5 mg total) by mouth 2 (two) times daily. 30 each 0  . Cholecalciferol (VITAMIN D3) 2000 units capsule Take 2,000 Units by mouth daily.     Marland Kitchen docusate sodium (COLACE) 100 MG capsule Take 100 mg by mouth daily as needed  (constipation).    . ferrous sulfate 325 (65 FE) MG tablet Take 325 mg by mouth daily with breakfast.    . HYDROcodone-acetaminophen (NORCO/VICODIN) 5-325 MG tablet Take 1 tablet by mouth every 6 (six) hours as needed (pain). Reported on 10/28/2015    . lactose free nutrition (BOOST) LIQD Take 237 mLs by mouth daily as needed.    Marland Kitchen lisinopril (PRINIVIL,ZESTRIL) 10 MG tablet Take 10 mg by mouth daily.    . metFORMIN (GLUCOPHAGE) 500 MG tablet Take 500 mg by mouth daily with breakfast.    . pantoprazole (PROTONIX) 40 MG tablet Take 1 tablet (40 mg total) by mouth 2 (two) times daily. 60 tablet 0    Home: Home Living Family/patient expects to be discharged to:: Inpatient rehab Living Arrangements: Spouse/significant other (lived with spouse until 3 months ago. Now two daughters stay) Available Help at Discharge: Family, Available 24 hours/day Type of Home: House Home Access: Stairs to enter [employer] of Steps: 1 Entrance Stairs-Rails: None Home Layout: One level Bathroom Shower/Tub: Tub/shower unit, Architectural technologist: Standard Bathroom Accessibility: Yes Additional Comments: pt lives in a home with her children in which someone is always there. Pt was using a RW PTA due to R LE pain  Lives With: Spouse (two daughters, [name] and Ivin Booty, rotate staying with her to)   Functional History: Prior Function Level of Independence: Independent with assistive device(s) Comments: pt was indep PTA but needed RW due to R LE pain  Functional Status:  Mobility: Bed Mobility Overal bed mobility: Needs Assistance, +2 for physical assistance Bed Mobility: Supine to Sit Supine to sit: Mod assist, +2 for physical assistance General bed mobility comments: Mod +2 assist to scoot hips EOB with bed pad and for trunk support to come to full sitting position. Verbal cues for hand placement and sequencing through tasks. Transfers Overall transfer level: Needs assistance Equipment used:  Rolling walker (2 wheeled) Transfers: Sit to/from Stand, [employer] Transfers Sit to Stand: Mod assist, +2 physical assistance Stand pivot transfers: Mod assist, +2 physical assistance General transfer comment: Mod +2 assist for boost to stand, stabilize balance, and to pivot hips. Verbal cues for proper hand placement and to stand up straight to facilitate hip extension during transfers. Ambulation/Gait General Gait Details: pt unable to tolerate at this time    ADL: ADL Overall ADL's : Needs assistance/impaired Grooming: Wash/dry hands, Wash/dry face, Set up, Sitting Upper Body Bathing: Set up, Sitting Lower Body Bathing: Set up, Sitting/lateral leans Lower Body Bathing Details (indicate cue type and reason): Sitting on BSC, pt propped L leg up on chair and was able to weight shift laterally to complete Upper Body Dressing : Set up, Sitting Lower Body Dressing: Set up, Sit to/from stand Lower Body Dressing Details (indicate cue type and reason): Propped L leg on recliner and leaned onto [facility] handle to don sock Toilet Transfer: Moderate assistance, +2 for physical assistance, Stand-pivot, [facility] Toilet Transfer Details (indicate cue type  and reason): Cues to pivot or twist foot, pt unable to hop Toileting- Clothing Manipulation and Hygiene: Minimal assistance, Sitting/lateral lean Toileting - Clothing Manipulation Details (indicate cue type and reason): Able to complete pericare same way as LB bathing, required assist for clothing manipulation due to requiring bilateral UE support to maintain upright standing position safely. Functional mobility during ADLs: Moderate assistance, +2 for physical assistance  Cognition: Cognition Overall Cognitive Status: Within Functional Limits for tasks assessed Orientation Level: Oriented X4 Cognition Arousal/Alertness: Lethargic Behavior During Therapy: WFL for tasks assessed/performed Overall Cognitive Status: Within Functional Limits for tasks  assessed    Blood pressure 136/46, pulse 79, temperature 97.1 F (36.2 C), temperature source Oral, resp. rate 18, SpO2 100 %. Physical Exam  Nursing note and vitals reviewed. Constitutional: She is oriented to person, place, and time. She appears well-developed and well-nourished.  HENT:  Head: Normocephalic and atraumatic.  Mouth/Throat: Oropharynx is clear and moist.  Eyes: Conjunctivae are normal. Pupils are equal, round, and reactive to light.  Neck: Normal range of motion. Neck supple.  Cardiovascular: Normal rate and regular rhythm.   No murmur heard. Respiratory: Effort normal and breath sounds normal. No stridor. No respiratory distress. She has no wheezes.  GI: Soft. Bowel sounds are normal. She exhibits no distension. There is no tenderness. There is no guarding.  Musculoskeletal: She exhibits edema and tenderness.  LLE with well healed old surgical incisions.  R-AKA  Neurological: She is alert and oriented to person, place, and time.  Labile at times but able to follow commands without difficulty. Speech clear.  Motor: B/l UE 5/5 LLE hip flexion 4-/5, knee extension 4/5, ankle dorsi/plantar flexion 4+/5 Right hip flexion 3+/5 (pain inhibition)   Skin: Skin is warm and dry.  Psychiatric: Her speech is normal. Thought content normal. Her affect is blunt and labile. Cognition and memory are normal.    Results for orders placed or performed during the hospital encounter of 12/23/15 (from the past 48 hour(s))  CBC     Status: Abnormal   Collection Time: 12/25/15  6:07 AM  Result Value Ref Range   WBC 10.3 4.0 - 10.5 K/uL   RBC 3.37 (L) 3.87 - 5.11 MIL/uL   Hemoglobin 7.8 (L) 12.0 - 15.0 g/dL   HCT 25.2 (L) 36.0 - 46.0 %   MCV 74.8 (L) 78.0 - 100.0 fL   MCH 23.1 (L) 26.0 - 34.0 pg   MCHC 31.0 30.0 - 36.0 g/dL   RDW 23.1 (H) 11.5 - 15.5 %   Platelets 537 (H) 150 - 400 K/uL    Comment: PLATELET COUNT CONFIRMED BY SMEAR  Basic metabolic panel     Status: Abnormal    Collection Time: 12/25/15  6:07 AM  Result Value Ref Range   Sodium 136 135 - 145 mmol/L   Potassium 4.3 3.5 - 5.1 mmol/L   Chloride 102 101 - 111 mmol/L   CO2 25 22 - 32 mmol/L   Glucose, Bld 119 (H) 65 - 99 mg/dL   BUN 9 6 - 20 mg/dL   Creatinine, Ser 0.57 0.44 - 1.00 mg/dL   Calcium 8.9 8.9 - 10.3 mg/dL   GFR calc non Af Amer >60 >60 mL/min   GFR calc Af Amer >60 >60 mL/min    Comment: (NOTE) The eGFR has been calculated using the CKD EPI equation. This calculation has not been validated in all clinical situations. eGFR's persistently <60 mL/min signify possible Chronic Kidney Disease.    Anion gap 9  5 - 15   No results found.     Medical Problem List and Plan: 1.  Weakness, abnormality of gait, pain secondary to R-AKA 2.  DVT Prophylaxis/Anticoagulation: Pharmaceutical: Lovenox 3. Pain Management: Continue oxycodone prn.  4. Mood: Team to provide ego support. LCSW to follow for evaluation and support.  5. Neuropsych: This patient is capable of making decisions on her own behalf. 6. Skin/Wound Care: Monitor wound for healing daily.  7. Fluids/Electrolytes/Nutrition: Monitor I/O. Continue  boost for supplements and to promote wound healing.  8. HTN: Monitor BP bid. Continue Norvasc and lisinopril  9. Recent subacute GIB: Had negative work up. Monitor H/H with serial checks. Monitor for recurrent melena/GIB. Continue Protonix BID.  10. Acute on chronic anemia: Increase iron to bid.   11. Heme positive stool: Monitor for melena or hematochezia.    13.  DMT2: Monitor BS with ac/hs checks.  Continue metformin 500 mg bid.  14. Thrombocytosis: Likely reactive. Continue to monitor.    Post Admission Physician Evaluation: 1. Functional deficits secondary  to  R-AKA. 2. Patient is admitted to receive collaborative, interdisciplinary care between the physiatrist, rehab nursing staff, and therapy team. 3. Patient's level of medical complexity and substantial therapy needs in  context of that medical necessity cannot be provided at a lesser intensity of care such as a SNF. 4. Patient has experienced substantial functional loss from his/her baseline which was documented above under the "Functional History" and "Functional Status" headings.  Judging by the patient's diagnosis, physical exam, and functional history, the patient has potential for functional progress which will result in measurable gains while on inpatient rehab.  These gains will be of substantial and practical use upon discharge  in facilitating mobility and self-care at the household level. 5. Physiatrist will provide 24 hour management of medical needs as well as oversight of the therapy plan/treatment and provide guidance as appropriate regarding the interaction of the two. 6. 24 hour rehab nursing will assist with bladder management, safety, skin/wound care, disease management, medication administration, pain management and patient education and help integrate therapy concepts, techniques,education, etc. 7. PT will assess and treat for/with: Lower extremity strength, range of motion, stamina, balance, functional mobility, safety, adaptive techniques and equipment, woundcare, coping skills, pain control, pre-prosthetic education.   Goals are: supervision/Min A. 8. OT will assess and treat for/with: ADL's, functional mobility, safety, upper extremity strength, adaptive techniques and equipment, wound mgt, ego support, and community reintegration.   Goals are: supervision/MinA. Therapy may not proceed with showering this patient. 9. Case Management and Social Worker will assess and treat for psychological issues and discharge planning. 10. Team conference will be held weekly to assess progress toward goals and to determine barriers to discharge. 11. Patient will receive at least 3 hours of therapy per day at least 5 days per week. 12. ELOS: 10-14 days.        13. Prognosis:  good  Delice Lesch, MD 12/26/2015

## 2015-12-27 ENCOUNTER — Inpatient Hospital Stay (HOSPITAL_COMMUNITY): Payer: Medicare Other

## 2015-12-27 ENCOUNTER — Inpatient Hospital Stay (HOSPITAL_COMMUNITY): Payer: Medicare Other | Admitting: Physical Therapy

## 2015-12-27 ENCOUNTER — Encounter (HOSPITAL_COMMUNITY): Payer: Self-pay | Admitting: Vascular Surgery

## 2015-12-27 LAB — CBC WITH DIFFERENTIAL/PLATELET
BASOS PCT: 0 %
Basophils Absolute: 0 10*3/uL (ref 0.0–0.1)
EOS ABS: 0.5 10*3/uL (ref 0.0–0.7)
EOS PCT: 6 %
HCT: 24.9 % — ABNORMAL LOW (ref 36.0–46.0)
Hemoglobin: 7.8 g/dL — ABNORMAL LOW (ref 12.0–15.0)
LYMPHS ABS: 1.6 10*3/uL (ref 0.7–4.0)
Lymphocytes Relative: 20 %
MCH: 22.9 pg — AB (ref 26.0–34.0)
MCHC: 31.3 g/dL (ref 30.0–36.0)
MCV: 73 fL — AB (ref 78.0–100.0)
MONO ABS: 0.6 10*3/uL (ref 0.1–1.0)
Monocytes Relative: 8 %
NEUTROS ABS: 5.2 10*3/uL (ref 1.7–7.7)
NEUTROS PCT: 66 %
PLATELETS: 587 10*3/uL — AB (ref 150–400)
RBC: 3.41 MIL/uL — ABNORMAL LOW (ref 3.87–5.11)
RDW: 23 % — ABNORMAL HIGH (ref 11.5–15.5)
WBC: 7.9 10*3/uL (ref 4.0–10.5)

## 2015-12-27 LAB — TYPE AND SCREEN
ABO/RH(D): A POS
ANTIBODY SCREEN: POSITIVE
DAT, IGG: NEGATIVE
DONOR AG TYPE: NEGATIVE
Donor AG Type: NEGATIVE
Unit division: 0
Unit division: 0

## 2015-12-27 LAB — COMPREHENSIVE METABOLIC PANEL
ALT: 13 U/L — ABNORMAL LOW (ref 14–54)
ANION GAP: 9 (ref 5–15)
AST: 19 U/L (ref 15–41)
Albumin: 2.1 g/dL — ABNORMAL LOW (ref 3.5–5.0)
Alkaline Phosphatase: 67 U/L (ref 38–126)
BUN: 11 mg/dL (ref 6–20)
CALCIUM: 9 mg/dL (ref 8.9–10.3)
CHLORIDE: 101 mmol/L (ref 101–111)
CO2: 28 mmol/L (ref 22–32)
CREATININE: 0.59 mg/dL (ref 0.44–1.00)
Glucose, Bld: 126 mg/dL — ABNORMAL HIGH (ref 65–99)
Potassium: 4.2 mmol/L (ref 3.5–5.1)
Sodium: 138 mmol/L (ref 135–145)
Total Bilirubin: 0.5 mg/dL (ref 0.3–1.2)
Total Protein: 7 g/dL (ref 6.5–8.1)

## 2015-12-27 LAB — GLUCOSE, CAPILLARY
GLUCOSE-CAPILLARY: 125 mg/dL — AB (ref 65–99)
GLUCOSE-CAPILLARY: 127 mg/dL — AB (ref 65–99)
GLUCOSE-CAPILLARY: 133 mg/dL — AB (ref 65–99)
Glucose-Capillary: 98 mg/dL (ref 65–99)

## 2015-12-27 LAB — URINALYSIS, ROUTINE W REFLEX MICROSCOPIC
BILIRUBIN URINE: NEGATIVE
Glucose, UA: NEGATIVE mg/dL
Ketones, ur: NEGATIVE mg/dL
NITRITE: NEGATIVE
PROTEIN: NEGATIVE mg/dL
Specific Gravity, Urine: 1.015 (ref 1.005–1.030)
pH: 7.5 (ref 5.0–8.0)

## 2015-12-27 LAB — URINE MICROSCOPIC-ADD ON

## 2015-12-27 MED ORDER — BOOST PLUS PO LIQD
237.0000 mL | Freq: Two times a day (BID) | ORAL | Status: DC
  Administered 2015-12-28 – 2016-01-05 (×15): 237 mL via ORAL
  Filled 2015-12-27 (×24): qty 237

## 2015-12-27 NOTE — Progress Notes (Signed)
At [facility], spoke with patient and daughter  R/T  Monitoring tylenol intake after Patient reports taking hydrocodone and tylenol for pain PTA. 2 percocet given. Left heel mushy, elevated off bed with pillow, might need boot. Decreased pedal pulse. Stage 2 area noted to left buttock, in gluteal fold. Unable to use foam dressing, because patient frequently incontinent of urine. Urine malodorous. Unable to tell when needing to void or when incontinent. Will spot check bladder scan. Talked with patient and daughter about importance of heel off bed and side lying  at all times, both state understanding. Dressing change to right AKA at HS. Staples in place, no drainage. Jessica Abbott A

## 2015-12-27 NOTE — Progress Notes (Signed)
   VASCULAR SURGERY ASSESSMENT & PLAN:   POD 4 Right AKA   Steady progress with CIR.  SUBJECTIVE: Pain well controlled.  PHYSICAL EXAM: Filed Vitals:   12/26/15 1616 12/27/15 0435 12/27/15 0951 12/27/15 1510  BP: 127/44 147/49 152/60 158/60  Pulse: 82 80  84  Temp: 98.7 F (37.1 C) 98.6 F (37 C)  98.6 F (37 C)  TempSrc: Oral Oral  Oral  Resp: 17 18  16   Height: 5\' 2"  (1.575 m)     Weight: 129 lb 6.4 oz (58.695 kg)   125 lb 12.8 oz (57.063 kg)  SpO2: 94% 98%  98%   Dressing Right AKA is dry.   LABS: Lab Results  Component Value Date   WBC 7.9 12/27/2015   HGB 7.8* 12/27/2015   HCT 24.9* 12/27/2015   MCV 73.0* 12/27/2015   PLT 587* 12/27/2015   CBG (last 3)   Recent Labs  12/26/15 2059 12/27/15 0658 12/27/15 1138  GLUCAP 116* 125* 127*    Active Problems:   S/P AKA (above knee amputation) unilateral (HCC)   Abnormality of gait   Weakness   Post-operative pain   Benign essential HTN   Anemia of chronic disease   Type 2 diabetes mellitus with complication, without long-term current use of insulin (HCC)   Thrombocytosis (HCC)   Unilateral AKA ([place])  Gae Gallop [id number] 12/27/2015

## 2015-12-27 NOTE — Plan of Care (Signed)
Problem: RH Balance Goal: LTG Patient will maintain dynamic sitting balance (PT) LTG: Patient will maintain dynamic sitting balance with assistance during mobility activities (PT) Without LOB & Without BUE support  Problem: RH Bed Mobility Goal: LTG Patient will perform bed mobility with assist (PT) LTG: Patient will perform bed mobility with assistance, with/without cues (PT). Without hospital bed features  Problem: RH Bed to Chair Transfers Goal: LTG Patient will perform bed/chair transfers w/assist (PT) LTG: Patient will perform bed/chair transfers with assistance, with/without cues (PT). With LRAD  Problem: RH Car Transfers Goal: LTG Patient will perform car transfers with assist (PT) LTG: Patient will perform car transfers with assistance (PT). With LRAD  Problem: RH Furniture Transfers Goal: LTG Patient will perform furniture transfers w/assist (OT/PT LTG: Patient will perform furniture transfers with assistance (OT/PT). With LRAD  Problem: RH Ambulation Goal: LTG Patient will ambulate in controlled environment (PT) LTG: Patient will ambulate in a controlled environment, # of feet with assistance (PT). 50 ft with LRAD Goal: LTG Patient will ambulate in home environment (PT) LTG: Patient will ambulate in home environment, # of feet with assistance (PT). 48 ft with LRAD  Problem: RH Wheelchair Mobility Goal: LTG Patient will propel w/c in community environment (PT) LTG: Patient will propel wheelchair in community environment, # of feet with assist (PT) >300 ft, over ramps, for community mobility  Problem: RH Stairs Goal: LTG Patient will ambulate up and down stairs w/assist (PT) LTG: Patient will ambulate up and down # of stairs with assistance (PT) 1 step without rails with LRAD

## 2015-12-27 NOTE — IPOC Note (Signed)
Overall Plan of Care [facility]) Patient Details Name: Jessica Abbott MRN: [id number] DOB: August 25, 1941  Admitting Diagnosis: [facility] Problems: Active Problems:   S/P AKA (above knee amputation) unilateral (HCC)   Abnormality of gait   Weakness   Post-operative pain   Benign essential HTN   Anemia of chronic disease   Type 2 diabetes mellitus with complication, without long-term current use of insulin (HCC)   Thrombocytosis (HCC)   Unilateral AKA (HCC)     Functional Problem List: Nursing Safety, Sensory, Skin Integrity, Pain, Bladder  PT Balance, Pain, Safety, Endurance  OT Balance, Endurance, Motor, Safety  SLP    TR         Basic ADL's: OT Grooming, Bathing, Dressing     Advanced  ADL's: OT       Transfers: PT Bed Mobility, Bed to Chair, Car, Manufacturing systems engineer, Metallurgist: PT Ambulation, Emergency planning/management officer, Stairs     Additional Impairments: OT    SLP        TR      Anticipated Outcomes Item Anticipated Outcome  Self Feeding Mod I  Swallowing      Basic self-care  Supervision and setup  Toileting  Mod I   Bathroom Transfers Supervision  Bowel/Bladder  Min A  Transfers  Supervision  Locomotion  mod I for w/c, supervision for ambulation  Communication     Cognition     Pain  <5  Safety/Judgment  Supervision   Therapy Plan: PT Intensity: Minimum of 1-2 x/day ,45 to 90 minutes PT Frequency: 5 out of 7 days PT Duration Estimated Length of Stay: 7 days OT Intensity: Minimum of 1-2 x/day, 45 to 90 minutes OT Frequency: 5 out of 7 days OT Duration/Estimated Length of Stay: 5-7         Team Interventions: Nursing Interventions Patient/Family Education, Skin Care/Wound Management, Bladder Management, Disease Management/Prevention, Pain Management  PT interventions Ambulation/gait training, Discharge planning, DME/adaptive equipment instruction, Functional mobility training, Pain management, Psychosocial support,  Splinting/orthotics, Therapeutic Activities, UE/LE Strength taining/ROM, Wheelchair propulsion/positioning, Therapeutic Exercise, Stair training, Patient/family education, Neuromuscular re-education, Disease management/prevention, Training and development officer  OT Interventions Training and development officer, Discharge planning, Self Care/advanced ADL retraining, Therapeutic Activities, Therapeutic Exercise, DME/adaptive equipment instruction, Functional mobility training, Patient/family education  SLP Interventions    TR Interventions    SW/CM Interventions Discharge Planning, Psychosocial Support, Patient/Family Education    Team Discharge Planning: Destination: PT-Home ,OT- Home , SLP-  Projected Follow-up: PT-Home health PT, OT-  None, SLP-  Projected Equipment Needs: PT-Wheelchair (measurements) (will follow up on w/c size), OT- To be determined, SLP-  Equipment Details: PT- (Pt already has BSC & RW), OT-  Patient/family involved in discharge planning: PT- Patient,  OT-Patient, SLP-   MD ELOS: 8-10 days. Medical Rehab Prognosis:  Good Assessment:  75 y.o. female with history of HTN, DMT2, CKD, pulmonary HTN, chronic right knee contracture, PVD s/p left femoral to dorsalis pedis bypass who developed right foot wound and superficial occlusion but declined AKA and anemia due to subacute GIB 11/2015 (negative EGD/colonoscopy). Mobility limited in past 1-2 months to few steps with walker and wheelchair due to RLE pain and ischemia. Underwent te on chronic anemia noted with heme positive stool, pain management.   See Team Conference Notes for weekly updates to the plan of care

## 2015-12-27 NOTE — Progress Notes (Signed)
Responded to spiritual care consult to provide support to patient.  Visited with patient and nurse indicated that therapist need to work with patient.  Nurse will page chaplain if further support is needed or patient request chaplain.  Chaplain available as needed.  Sarissa Dern Oswaldo Milian, Staff Chaplain, [place] 4:20pm

## 2015-12-27 NOTE — Progress Notes (Signed)
Social Work  Social Work Assessment and Plan  Patient Details  Name: Jessica Abbott: [id number] Date of Birth: October 21, 1940  Today's Date: 12/27/2015  Problem List:  Patient Active Problem List   Diagnosis Date Noted  . S/P AKA (above knee amputation) unilateral ([place]) 12/26/2015  . Unilateral AKA ([place]) 12/26/2015  . Abnormality of gait   . Weakness   . Post-operative pain   . Benign essential HTN   . Anemia of chronic disease   . Type 2 diabetes mellitus with complication, without long-term current use of insulin ([employer])   . Thrombocytosis ([place])   . Atherosclerosis of native arteries of extremities with gangrene, right leg ([place]) 12/23/2015  . Bleeding gastrointestinal   . Controlled diabetes mellitus type 2 with complications ([place])   . 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 PVD (peripheral vascular disease) ([place])   . Acute blood loss anemia   . Gangrene of foot ([place])   . Gastrointestinal hemorrhage with melena   . Lactic acidosis   . Acute renal failure ([place])   . Acute GI bleeding 11/22/2015  . Upper GI bleed 11/22/2015   Past Medical History:  Past Medical History  Diagnosis Date  . Hypertension   . Peripheral vascular disease ([place])   . Diabetes mellitus without complication ([place])   . Arthritis   . Peripheral arterial disease ([place])   . Chronic kidney disease   . SOB (shortness of breath)   . History of pneumonia   . History of bronchitis   . Stress incontinence   . Anemia   . History of blood transfusion   . Pulmonary hypertension ([place])     [id number] echo: PA systolic pressure severely increaed, PA peak pressure 66 mm Hg.   Marland Kitchen Aortic regurgitation     moderate AR by 11/25/15 echo  . S/P AKA (above knee amputation) unilateral ([place]) 12/26/2015   Past Surgical History:  Past Surgical History  Procedure Laterality Date  . Back surgery  2002  . Abdominal hysterectomy    . Femoral artery arteriogram  04/20/2015  . Esophagogastroduodenoscopy (egd) with  propofol N/A 11/23/2015    Procedure: ESOPHAGOGASTRODUODENOSCOPY (EGD) WITH PROPOFOL;  Surgeon: Jessica Lobo, MD;  Location: [place];  Service: Endoscopy;  Laterality: N/A;  . Colonoscopy N/A 11/24/2015    Procedure: COLONOSCOPY;  Surgeon: Jessica Lobo, MD;  Location: [facility] ENDOSCOPY;  Service: Endoscopy;  Laterality: N/A;  . Givens capsule study N/A 11/26/2015    Procedure: GIVENS CAPSULE STUDY;  Surgeon: Jessica Lobo, MD;  Location: [place];  Service: Endoscopy;  Laterality: N/A;   Social History:  reports that she quit smoking about 2 years ago. She has never used smokeless tobacco. She reports that she does not drink alcohol or use illicit drugs.  Family / Support Systems Marital Status: Married Patient Roles: Spouse, Parent Spouse/Significant Other: has had CVA 3 years ago and uses rw Children: Jessica Abbott-daughter [id number]  Jessica Abbott-daughter [id number] Other Supports: other five children Anticipated Caregiver: daughter's rotate care Ability/Limitations of Caregiver: between seven children-four daughter's they will provide 24 hr care, had prior to admission Caregiver Availability: 24/7 Family Dynamics: Close knit family who have been providing 24 hr care prior to admission with pt's foot and now her leg. She reports she and her husband have not been getting along since her leg became bad. Their children take care of both of them.  Social History Preferred language: English Religion: Baptist Cultural Background: No issues Education: [employer] Jessica Read: Yes Write:  Yes Employment Status: Retired Freight forwarder Issues: No issues Guardian/Conservator: none-according to MD pt is capable of making her own decisions while here.   Abuse/Neglect Physical Abuse: Denies Verbal Abuse: Denies Sexual Abuse: Denies Exploitation of patient/patient's resources: Denies Self-Neglect: Denies  Emotional Status Pt's affect, behavior adn adjustment status: Pt  is motivated to improve and recover from her surgery, she is aware she needs to heal first before she can get a prothesis. She wants to do all she can to prepare herself for this. Her daughter's have been assisting her at home prior to admission and will continue once home. Recent Psychosocial Issues: other health issues this is the main one besides her diabetes Pyschiatric History: No history deferred depression screening due to pt doing as well as expected. She realizes it will take time and she tried everything to salvage her leg, but could not. She relies upon her faith and children to pull her through. Substance Abuse History: Quit smoking one year ago  Patient / Family Perceptions, Expectations & Goals Pt/Family understanding of illness & functional limitations: Pt and daughter's have a good understanding of her amputation and the path it took to get to this point. She talks with the MD and so do her daughter's and feel their questions are being addressed. Premorbid pt/family roles/activities: Wife, Mom, grandmother, church member, retiree, friend, sister, etc Anticipated changes in roles/activities/participation: resume Pt/family expectations/goals: Pt states: " I want to do as much as I can for myself before I leave here."  Jessica Abbott states: " I hope she is on her way to recovering from this, we will continue to help her.'  [employer]: None Premorbid Home Care/DME Agencies: Other (Comment) (had [place] agency can't remember which one) Transportation available at discharge: Family provides this, pt has not ever driven Resource referrals recommended: Support group (specify)  Discharge Planning Living Arrangements: Spouse/significant other Support Systems: Children, Other relatives, Friends/neighbors, Church/faith community Type of Residence: Private residence Insurance Resources: [employer] Jessica Resources: Radio broadcast assistant Screen Referred: No Living  Expenses: Lives with family Money Management: Family Does the patient have any problems obtaining your medications?: No Home Management: Daughter's do the home management Patient/Family Preliminary Plans: Return home with husband who their daughter's assist with his care along with her care also. There is someone with them-24 hr at home. Daughter's rotate their shifts. Will paln to continue once discharged home also. Will await team's evalutions. Social Work Anticipated Follow Up Needs: HH/OP, Support Group  Clinical Impression Pleasant female who is motivated to do well here and recover from this and eventually get a prothesis and walk again. Her husband has had a CVA three years ago and requires care himself. Daughters provide care to both of their parents. Someone is always there at the home-24 hr. Pt has been functioning from a wheelchair level so should do well here and be a short length of stay. Will await team evaluations and work on a discharge plan. Very good support from her children.  [name] 12/27/2015, 10:01 AM

## 2015-12-27 NOTE — Progress Notes (Signed)
Patient information reviewed and entered into eRehab system by Abrham Maslowski, RN, CRRN, PPS Coordinator.  Information including medical coding and functional independence measure will be reviewed and updated through discharge.     Per nursing patient was given "Data Collection Information Summary for Patients in Inpatient Rehabilitation Facilities with attached "Privacy Act Statement-Health Care Records" upon admission.  

## 2015-12-27 NOTE — Evaluation (Signed)
Occupational Therapy Assessment and Plan  Patient Details  Name: Jessica Abbott MRN: [id number] Date of Birth: 03/25/1941  OT Diagnosis: muscle weakness (generalized) Rehab Potential: Rehab Potential (ACUTE ONLY): Excellent ELOS: 5-7   Today's Date: 12/27/2015 OT Individual Time: 1155-2080 OT Individual Time Calculation (min): 60 min     Problem List:  Patient Active Problem List   Diagnosis Date Noted  . S/P AKA (above knee amputation) unilateral ([place]) 12/26/2015  . Unilateral AKA ([place]) 12/26/2015  . Abnormality of gait   . Weakness   . Post-operative pain   . Benign essential HTN   . Anemia of chronic disease   . Type 2 diabetes mellitus with complication, without long-term current use of insulin ([place])   . Thrombocytosis ([place])   . Atherosclerosis of native arteries of extremities with gangrene, right leg ([place]) 12/23/2015  . Bleeding gastrointestinal   . Controlled diabetes mellitus type 2 with complications ([place])   . 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 PVD (peripheral vascular disease) ([place])   . Acute blood loss anemia   . Gangrene of foot ([place])   . Gastrointestinal hemorrhage with melena   . Lactic acidosis   . Acute renal failure ([place])   . Acute GI bleeding 11/22/2015  . Upper GI bleed 11/22/2015    Past Medical History:  Past Medical History  Diagnosis Date  . Hypertension   . Peripheral vascular disease ([place])   . Diabetes mellitus without complication ([place])   . Arthritis   . Peripheral arterial disease ([place])   . Chronic kidney disease   . SOB (shortness of breath)   . History of pneumonia   . History of bronchitis   . Stress incontinence   . Anemia   . History of blood transfusion   . Pulmonary hypertension ([place])     11/09/34 echo: PA systolic pressure severely increaed, PA peak pressure 66 mm Hg.   Marland Kitchen Aortic regurgitation     moderate AR by 11/25/15 echo  . S/P AKA (above knee amputation) unilateral ([place]) 12/26/2015   Past Surgical  History:  Past Surgical History  Procedure Laterality Date  . Back surgery  2002  . Abdominal hysterectomy    . Femoral artery arteriogram  04/20/2015  . Esophagogastroduodenoscopy (egd) with propofol N/A 11/23/2015    Procedure: ESOPHAGOGASTRODUODENOSCOPY (EGD) WITH PROPOFOL;  Surgeon: Ronald Lobo, MD;  Location: [facility];  Service: Endoscopy;  Laterality: N/A;  . Colonoscopy N/A 11/24/2015    Procedure: COLONOSCOPY;  Surgeon: Ronald Lobo, MD;  Location: [facility] END[facility]COPY;  Service: Endoscopy;  Laterality: N/A;  . Givens capsule study N/A 11/26/2015    Procedure: GIVENS CAPSULE STUDY;  Surgeon: Ronald Lobo, MD;  Location: [facility];  Service: Endoscopy;  Laterality: N/A;  . Amputation Right 12/23/2015    Procedure: RIGHT  ABOVE KNEE AMPUTATION;  Surgeon: Angelia Mould, MD;  Location: [facility];  Service: Vascular;  Laterality: Right;    Assessment & Plan Clinical Impression: Patient is a 75 y.o. female with history of HTN, DMT2, CKD, pulmonary HTN, chronic right knee contracture, PVD s/p left femoral to dorsalis pedis bypass who developed right foot wound and superficial occlusion but declined AKA and anemia due to subacute gastrointestinal bleeding (GIB) 11/2015 (negative EGD/colonoscopy). Mobility limited in past 1-2 months to few steps with walker and wheelchair due to RLE pain and ischemia. 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 Scot Dock. Hospital course complicated by acute on  chronic anemia noted with heme positive stool, pain management. Denies associated phantom limb pain. Therapy evaluations done and CIR recommended to help patient regain prior level of independence.  Patient transferred to CIR on 12/26/2015 .    Patient currently requires moderate assistance with basic self-care skills secondary to muscle weakness.  Prior to hospitalization, patient could complete BADL with modified independence.   Patient will benefit  from skilled intervention to increase independence with basic self-care skills prior to discharge home with care partner.  Anticipate patient will require intermittent supervision and no further OT follow recommended.  OT - End of Session Activity Tolerance: Tolerates 30+ min activity with multiple rests Endurance Deficit: Yes OT Assessment Rehab Potential (ACUTE ONLY): Excellent OT Patient demonstrates impairments in the following area(s): Balance;Endurance;Motor;Safety OT Basic ADL's Functional Problem(s): Grooming;Bathing;Dressing OT Transfers Functional Problem(s): Toilet;Tub/Shower OT Plan OT Intensity: Minimum of 1-2 x/day, 45 to 90 minutes OT Frequency: 5 out of 7 days OT Duration/Estimated Length of Stay: 5-7 OT Treatment/Interventions: Balance/vestibular training;Discharge planning;Self Care/advanced ADL retraining;Therapeutic Activities;Therapeutic Exercise;DME/adaptive equipment instruction;Functional mobility training;Patient/family education OT Self Feeding Anticipated Outcome(s): Mod I OT Basic Self-Care Anticipated Outcome(s): Supervision and setup OT Toileting Anticipated Outcome(s): Mod I OT Bathroom Transfers Anticipated Outcome(s): Supervision OT Recommendation Patient destination: Home Follow Up Recommendations: None Equipment Recommended: To be determined   Skilled Therapeutic Intervention OT initial evaluation completed with treatment provided to focus on improved transfers, bed mobility, toileting, and adapted bathing/dressing skills.   Pt required extra time to manage RLE pain but managed to roll to her right and rise to sit at EOB w/o rail or HOB elevated.   Pt completed stand-pivot transfer to walker with mod assist to lower safely and pivot and instructional cues.   Pt was incontinent of urine during transfer and requested assist to toilet.   Pt completed squat-pivot transfer from w/c with mod assist, she toileted, and bathed using wash basin provided and placed  on w/c in front of toilet.   Pt demo'd good thoroughness and sequencing with min instructional cues.   Pt requested return to bed to don mesh underwear with protective pad but required mod assist to lace underwear.   Pt left in bed at end of session with all needs within reach.  OT Evaluation Precautions/Restrictions  Precautions Precautions: Fall Precaution Comments: new R AKA Restrictions Weight Bearing Restrictions: Yes RLE Weight Bearing: Non weight bearing  General Chart Reviewed: Yes Family/Caregiver Present: No  Vital Signs Therapy Vitals BP: (!) 152/60 mmHg  Pain Pain Assessment Pain Assessment: No/denies pain Pain Score: 2  Pain Type: Acute pain;Surgical pain Pain Location: Leg Pain Orientation: Right Pain Descriptors / Indicators: Aching;Sore Pain Frequency: Intermittent Pain Onset: On-going Pain Intervention(s): Medication (See eMAR) Multiple Pain Sites: No  Home Living/Prior Functioning Home Living Living Arrangements: Spouse/significant other Available Help at Discharge: Family, Available 24 hours/day Type of Home: House Home Access: Stairs to enter [employer] of Steps: 1 Entrance Stairs-Rails: None Home Layout: One level Bathroom Shower/Tub: Tub/shower unit, Architectural technologist: Standard Bathroom Accessibility: No Additional Comments: pt lives in a home with her children in which someone is always there. Pt was using a RW PTA due to R LE pain  Lives With: Spouse Broadus John, 7 children live in town and rotate assistance.) IADL History Homemaking Responsibilities: Yes Meal Prep Responsibility: Secondary Laundry Responsibility: Secondary Cleaning Responsibility: Secondary Bill Paying/Finance Responsibility: No Shopping Responsibility: No Child Care Responsibility: No Homemaking Comments: Did more iADL one month prior; children drove her to store to  shop.  Now plans to let children help more. Current License: No Mode of Transportation:  Family Education: 6th grade Occupation: Retired Type of Occupation: L:aborer at [employer] Leisure and Hobbies: Family Prior Function Level of Independence: Independent with basic ADLs  Able to [place]?: No Driving: No Vocation: Retired Comments: pt was indep PTA but needed RW due to R LE pain  ADL ADL ADL Comments: see Functional Asessment Tool  Vision/Perception  Vision- History Baseline Vision/History: No visual deficits Vision- Assessment Vision Assessment?: No apparent visual deficits Perception Comments: WFL   Cognition Arousal/Alertness: Awake/alert Orientation Level: Person;Place;Situation Person: Oriented Place: Oriented Situation: Oriented Year: 2017 Month: April Day of Week: Correct Memory: Appears intact Immediate Memory Recall: Sock;Blue;Bed Memory Recall: Sock;Blue;Bed Memory Recall Sock: With Cue Memory Recall Blue: With Cue Memory Recall Bed: Without Cue Attention: Alternating Awareness: Appears intact Problem Solving: Appears intact Safety/Judgment: Appears intact  Sensation Sensation Light Touch: Appears Intact Stereognosis: Appears Intact Hot/Cold: Appears Intact Proprioception: Appears Intact Coordination Gross Motor Movements are Fluid and Coordinated: Yes Fine Motor Movements are Fluid and Coordinated: Yes  Motor  Motor Motor: Within Functional Limits  Mobility  Bed Mobility Bed Mobility: Rolling Left;Supine to Sit;Sit to Supine;Rolling Right Rolling Right: 5: Supervision Rolling Right Details:  (with bed rails) Rolling Left: 5: Supervision (with bed rails) Supine to Sit: 5: Supervision (without bed features) Sit to Supine: 5: Supervision (without bed features) Transfers Sit to Stand: 4: Min guard Stand to Sit: 4: Min guard   Trunk/Postural Assessment  Cervical Assessment Cervical Assessment: Within Functional Limits Thoracic Assessment Thoracic Assessment: Within Functional Limits Lumbar Assessment Lumbar  Assessment: Within Functional Limits Postural Control Postural Control: Deficits on evaluation Trunk Control: Generalized weakness observed while sitting on toilet; required rail for support and readjustment of position.   Balance Balance Balance Assessed: Yes Static Sitting Balance Static Sitting - Balance Support: No upper extremity supported Static Sitting - Level of Assistance: 6: Modified independent (Device/Increase time) Dynamic Sitting Balance Dynamic Sitting - Balance Support: Bilateral upper extremity supported Dynamic Sitting - Level of Assistance: 5: Stand by assistance Sitting balance - Comments:  (loss of balance without BUE support & LLE support)  Extremity/Trunk Assessment RUE Assessment RUE Assessment: Within Functional Limits LUE Assessment LUE Assessment: Within Functional Limits   See Function Navigator for Current Functional Status.   Refer to Care Plan for Long Term Goals  Recommendations for other services: None  Discharge Criteria: Patient will be discharged from OT if patient refuses treatment 3 consecutive times without medical reason, if treatment goals not met, if there is a change in medical status, if patient makes no progress towards goals or if patient is discharged from hospital.  The above assessment, treatment plan, treatment alternatives and goals were discussed and mutually agreed upon: by patient   Second session: Time: 1335-1410 Time Calculation (min):  35 min  Pain Assessment: No/denies pain  Skilled Therapeutic Interventions: ADL-retraining with focus on toileting and toilet transfers.   Pt received supine in bed and requesting assist to toilet for BM.   Pt completed stand-pivot transfer to [facility] placed at bedside with steadying assist and completed large BM with extra time and overall max assist to manage clothing and hygiene.   Pt able to perform lateral leans to cleanse herself but was unaware of stool loss (to floor and rim of seat)  while standing for assist with removal of brief during transfer.   Pt returned to bed at end of session with all needs placed  within reach.      See FIM for current functional status  Therapy/Group: Individual Therapy  [place] 12/27/2015, 12:51 PM

## 2015-12-27 NOTE — Evaluation (Signed)
Physical Therapy Assessment and Plan  Patient Details  Name: Jessica Abbott MRN: [id number] Date of Birth: 1941/02/15  PT Diagnosis: Abnormality of gait, Difficulty walking and Muscle weakness Rehab Potential: Good ELOS: 7 days   Today's Date: 12/27/2015 PT Individual Time: 5681-2751 PT Individual Time Calculation (min): 62 min    Problem List:  Patient Active Problem List   Diagnosis Date Noted  . S/P AKA (above knee amputation) unilateral ([place]) 12/26/2015  . Unilateral AKA ([place]) 12/26/2015  . Abnormality of gait   . Weakness   . Post-operative pain   . Benign essential HTN   . Anemia of chronic disease   . Type 2 diabetes mellitus with complication, without long-term current use of insulin ([place])   . Thrombocytosis ([place])   . Atherosclerosis of native arteries of extremities with gangrene, right leg ([place]) 12/23/2015  . Bleeding gastrointestinal   . Controlled diabetes mellitus type 2 with complications ([place])   . 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 PVD (peripheral vascular disease) ([place])   . Acute blood loss anemia   . Gangrene of foot ([place])   . Gastrointestinal hemorrhage with melena   . Lactic acidosis   . Acute renal failure ([place])   . Acute GI bleeding 11/22/2015  . Upper GI bleed 11/22/2015    Past Medical History:  Past Medical History  Diagnosis Date  . Hypertension   . Peripheral vascular disease ([place])   . Diabetes mellitus without complication ([place])   . Arthritis   . Peripheral arterial disease ([place])   . Chronic kidney disease   . SOB (shortness of breath)   . History of pneumonia   . History of bronchitis   . Stress incontinence   . Anemia   . History of blood transfusion   . Pulmonary hypertension ([place])     7/00/17 echo: PA systolic pressure severely increaed, PA peak pressure 66 mm Hg.   Marland Kitchen Aortic regurgitation     moderate AR by 11/25/15 echo  . S/P AKA (above knee amputation) unilateral ([place]) 12/26/2015   Past Surgical History:   Past Surgical History  Procedure Laterality Date  . Back surgery  2002  . Abdominal hysterectomy    . Femoral artery arteriogram  04/20/2015  . Esophagogastroduodenoscopy (egd) with propofol N/A 11/23/2015    Procedure: ESOPHAGOGASTRODUODENOSCOPY (EGD) WITH PROPOFOL;  Surgeon: Ronald Lobo, MD;  Location: [facility];  Service: Endoscopy;  Laterality: N/A;  . Colonoscopy N/A 11/24/2015    Procedure: COLONOSCOPY;  Surgeon: Ronald Lobo, MD;  Location[facility] ENDOSCOPY;  Service: Endoscopy;  Laterality: N/A;  . Givens capsule study N/A 11/26/2015    Procedure: GIVENS CAPSULE STUDY;  Surgeon: Ronald Lobo, MD;  Location: [facility];  Service: Endoscopy;  Laterality: N/A;  . Amputation Right 12/23/2015    Procedure: RIGHT  ABOVE KNEE AMPUTATION;  Surgeon: Angelia Mould, MD;  Location: [facility] OR;  Service: Vascular;  Laterality: Right;    Assessment & Plan Clinical Impression: Patient is a 75 y.o. year old female with history of HTN, DMT2, CKD, pulmonary HTN, chronic right knee contracture, PVD s/p left femoral to dorsalis pedis bypass who developed right foot wound and superficial occlusion but declined AKA and anemia due to subacute GIB 11/2015 (negative EGD/colonoscopy). Mobility limited in past 1-2 months to few steps with walker and wheelchair due to RLE pain and ischemia. 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 Scot Dock. Hospital course complicated by acute  on chronic anemia noted with heme positive stool, pain management. Denies associated phantom limb pain. Therapy evaluations done and CIR recommended to help patient regain prior level of independence.Patient transferred to CIR on 12/26/2015 .   Patient currently requires min with mobility secondary to muscle weakness, decreased cardiorespiratoy endurance,and decreased sitting balance, decreased standing balance and decreased balance strategies.  Prior to hospitalization, patient was min  with mobility in past 4 weeks, but prior to that pt was independent and lived with Spouse (lives with husband (separated per chart) & daughters assist pt  during day) in a House home.  Home access is 1Stairs to enter without railings  Patient will benefit from skilled PT intervention to maximize safe functional mobility, minimize fall risk and decrease caregiver burden for planned discharge home with 24 hour supervision.  Anticipate patient will benefit from follow up [place] at discharge.  PT - End of Session Activity Tolerance: Tolerates 30+ min activity with multiple rests Endurance Deficit: Yes Endurance Deficit Description: 2/2 fatigue PT Assessment Rehab Potential (ACUTE/IP ONLY): Good PT Patient demonstrates impairments in the following area(s): Balance;Pain;Safety;Endurance PT Transfers Functional Problem(s): Bed Mobility;Bed to Chair;Car;Furniture PT Locomotion Functional Problem(s): Ambulation;Wheelchair Mobility;Stairs PT Plan PT Intensity: Minimum of 1-2 x/day ,45 to 90 minutes PT Frequency: 5 out of 7 days PT Duration Estimated Length of Stay: 7 days PT Treatment/Interventions: Ambulation/gait training;Discharge planning;DME/adaptive equipment instruction;Functional mobility training;Pain management;Psychosocial support;Splinting/orthotics;Therapeutic Activities;UE/LE Strength taining/ROM;Wheelchair propulsion/positioning;Therapeutic Exercise;Stair training;Patient/family education;Neuromuscular re-education;Disease management/prevention;Balance/vestibular training PT Transfers Anticipated Outcome(s): Supervision PT Locomotion Anticipated Outcome(s): mod I for w/c, supervision for ambulation PT Recommendation Follow Up Recommendations: Home health PT Patient destination: Home Equipment Recommended: Wheelchair (measurements) (will follow up on w/c size) Equipment Details:  (Pt already has BSC & RW)  Skilled Therapeutic Intervention Treatment 1: Pt received in bed & agreeable to PT.  Evaluation & manual testing completed as noted below in flow sheet. Pt currently at [place] A level for stand pivot transfers & min guard for sit<>stand and ambulation ~15 ft with RW. Pt demonstrates sufficient strength in BUE to allow her to push up on RW and progress LLE. Pt is at [place] A for car transfers via stand pivot. Pt experienced episode of incontinence during ambulation & required assistance to be cleaned up; PT also notified RN of incontinence & need to assess R AKA dressing as pt reported a lot of urine got on RLE. Pt would benefit from skilled PT with focus on increasing strength & endurance, progressing towards supervision with transfers and ambulation. Pt would benefit from gait training for household ambulation and w/c mobility for community mobility. Also need to address sitting & standing balance as pt is unable to maintain sitting dynamic balance without BUE support.   Treatment 2: Pt received in bed denying c/o pain but reporting feeling "woozy headed" after taking pain medication; Pt's BP = 132/44 mmHg. Pt transferred supine>sit with supervision, bed rails & extended time. Pt moving slowly during session today, reporting she's "just tired". Pt allowed rest breaks as needed throughout session. Pt able to complete squat pivot bed>w/c with Min A with verbal & visual cuing for proper hand placement. Pt self propelled w/c 150 ft with BUE & supervision/mod I. Pt required cuing for proper hand placement during transfers as pt tends to place hands on RW but instructed to push up from armrests of w/c. Pt completed sit>stand with steady A & ambulated over uneven surface 10 ft with RW & min A. Pt required assistance for proper approximation of RW &  LLE to uneven surface, as well as assistance to transfer RW onto uneven surface. Pt able to push up through BUE to advance LLE but unable to fully clear foot from floor. Pt reported dizziness & fatigue with task & required min A to transfer stand>sit. After  sufficient rest break pt agreeable to attempt stair negotiation denying continued dizziness. Pt negotiated 2 steps (3 inches tall) with BUE on railings and heavy reliance on RUE (elbow) to support weight to allow pt to advance LLE. Pt fatigued quickly & required rest break after negotiating 2 steps. PT provided pt with appropriate size RW & pt self propelled w/c back to room & left with all needs within reach.   PT Evaluation Precautions/Restrictions Precautions Precautions: Fall Precaution Comments: R AKA Restrictions Weight Bearing Restrictions: Yes RLE Weight Bearing: Non weight bearing Pain Pain Assessment Pain Assessment: No/denies pain Home Living/Prior Functioning Home Living Living Arrangements: Spouse/significant other Available Help at Discharge: Available 24 hours/day;Family;Friend(s) (daughters have been assisting pt PTA; pt reports she can have 24/7 assistance at d/c) Type of Home: House Home Access: Stairs to enter [employer] of Steps: 1 Entrance Stairs-Rails: None Home Layout: One level Additional Comments: pt has RW & BSC at home  Lives With: Spouse (lives with husband (separated per chart) & daughters assist pt  during day) Prior Function Level of Independence: Requires assistive device for independence;Needs assistance with ADLs;Needs assistance with gait;Needs assistance with tranfers (in last 3-4 weeks)  Able to Take Stairs?: Yes Driving: No Leisure: Hobbies-yes (Comment) Comments: visiting with family/friends Vision/Perception  Wears glasses for reading only Cognition Arousal/Alertness: Awake/alert Orientation Level: Oriented X4;Oriented to person;Oriented to place;Oriented to time;Oriented to situation Safety/Judgment: Appears intact Sensation Sensation Light Touch: Appears Intact (BLE) Stereognosis: Appears Intact Hot/Cold: Appears Intact Proprioception: Appears Intact (L LE) Motor  Motor Motor: Within Functional Limits  Mobility Bed  Mobility Bed Mobility: Rolling Left;Supine to Sit;Sit to Supine;Rolling Right Rolling Right: 5: Supervision Rolling Right Details:  (with bed rails) Rolling Left: 5: Supervision (with bed rails) Supine to Sit: 5: Supervision (without bed features) Sit to Supine: 5: Supervision (without bed features) Transfers Transfers: Yes Sit to Stand: 4: Min guard Stand to Sit: 4: Min guard Stand Pivot Transfers: 4: Min Psychologist, occupational Details: Verbal cues for technique;Visual cues/gestures for sequencing Locomotion  Ambulation Ambulation: Yes Ambulation/Gait Assistance: 4: Min guard Ambulation Distance (Feet): 15 Feet Assistive device: Rolling walker Gait Gait: Yes Gait Pattern:  ("hopping" with LLE and pushing with BUE) Stairs / Additional Locomotion Stairs: No Architect: Yes Wheelchair Assistance: 5: Careers information officer: Both upper extremities Wheelchair Parts Management: Needs assistance Distance: 250 ft     Balance Balance Balance Assessed: Yes Static Sitting Balance Static Sitting - Balance Support: No upper extremity supported Static Sitting - Level of Assistance: 6: Modified independent (Device/Increase time) Dynamic Sitting Balance Dynamic Sitting - Balance Support: Bilateral upper extremity supported Dynamic Sitting - Level of Assistance: 5: Stand by assistance Sitting balance - Comments:  (loss of balance without BUE support & LLE support) Extremity Assessment  RUE Assessment RUE Assessment: Within Functional Limits LUE Assessment LUE Assessment: Within Functional Limits RLE Assessment RLE Assessment: Within Functional Limits (R AKA; WFL AROM) LLE Assessment LLE Assessment: Exceptions to WFL (~10 degrees L ankle dorsiflexion) LLE Strength Left Hip Flexion: 3+/5 Left Knee Flexion: 4/5 Left Knee Extension: 4/5 Left Ankle Dorsiflexion: 3+/5   See Function Navigator for Current Functional Status.   Refer to  Care Plan for Long Term  Goals  Recommendations for other services: None  Discharge Criteria: Patient will be discharged from PT if patient refuses treatment 3 consecutive times without medical reason, if treatment goals not met, if there is a change in medical status, if patient makes no progress towards goals or if patient is discharged from hospital.  The above assessment, treatment plan, treatment alternatives and goals were discussed and mutually agreed upon: by patient  Waunita Schooner 12/27/2015, 12:09 PM

## 2015-12-27 NOTE — Progress Notes (Signed)
Initial Nutrition Assessment  DOCUMENTATION CODES:   Not applicable  INTERVENTION:  Provide Boost Plus po BID between meals, each supplement provides 360 kcal and 14 grams of protein.   Encourage adequate PO intake.   NUTRITION DIAGNOSIS:   Increased nutrient needs related to wound healing as evidenced by estimated needs.  GOAL:   Patient will meet greater than or equal to 90% of their needs  MONITOR:   PO intake, Supplement acceptance, Weight trends, Labs, I & O's, Skin  REASON FOR ASSESSMENT:   Malnutrition Screening Tool    ASSESSMENT:   75 y.o. female with history of HTN, DMT2, CKD, pulmonary HTN, chronic right knee contracture, PVD s/p left femoral to dorsalis pedis bypass who developed right foot wound and superficial occlusion but declined AKA and anemia due to subacute GIB 11/2015 (negative EGD/colonoscopy).  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  Pt reports appetite has improved. Meal completion has been 100%. Pt reports eating well PTA with at least 3 meals a day. Usual body weight reported to be ~136 lbs. Pt with a 5.8% weight loss, however noted pt s/p R AKA. Pt currently has Boost Plus and has been consuming them. Will continue with orders. Pt encouraged to eat her food at meals and to drink her supplements.   Limited nutrition-Focused physical exam completed. Findings are mild fat depletion, mild to moderate muscle depletion, and mild edema. Depletion likely associated with natural aging process.   Labs and medications reviewed.   Diet Order:  Diet Carb Modified Fluid consistency:: Thin; Room service appropriate?: Yes  Skin:  Wound (see comment) (Stage 2 pressure ulcer on L buttocks)  Last BM:  4/10  Height:   Ht Readings from Last 1 Encounters:  12/26/15 5\' 2"  (1.575 m)    Weight:   Wt Readings from Last 1 Encounters:  12/26/15 129 lb 6.4 oz (58.695 kg)    Ideal Body Weight:  46 kg (adjusted for  AKA)  BMI:  Body mass index is 23.66 kg/(m^2).  Estimated Nutritional Needs:   Kcal:  1700-1850  Protein:  80-90 grams  Fluid:  1.6 - 1.8 L/day  EDUCATION NEEDS:   No education needs identified at this time  Corrin Parker, MS, RD, LDN Pager # [phone] After hours/ weekend pager # [phone]

## 2015-12-27 NOTE — Progress Notes (Signed)
[facility] PHYSICAL MEDICINE & REHABILITATION     PROGRESS NOTE  Subjective/Complaints:  Patient seen sitting up in bed this morning. He states she slept well overnight. She is ready to begin therapies today.  ROS: + Urinary incontinence. Denies CP, SOB, N/V/D  Objective: Vital Signs: Blood pressure 147/49, pulse 80, temperature 98.6 F (37 C), temperature source Oral, resp. rate 18, height 5\' 2"  (1.575 m), weight 58.695 kg (129 lb 6.4 oz), SpO2 98 %. No results found.  Recent Labs  12/25/15 0607 12/27/15 0558  WBC 10.3 7.9  HGB 7.8* 7.8*  HCT 25.2* 24.9*  PLT 537* 587*    Recent Labs  12/25/15 0607 12/27/15 0558  NA 136 138  K 4.3 4.2  CL 102 101  GLUCOSE 119* 126*  BUN 9 11  CREATININE 0.57 0.59  CALCIUM 8.9 9.0   CBG (last 3)   Recent Labs  12/26/15 1701 12/26/15 2059 12/27/15 0658  GLUCAP 131* 116* 125*    Wt Readings from Last 3 Encounters:  12/26/15 58.695 kg (129 lb 6.4 oz)  12/09/15 62.143 kg (137 lb)  11/28/15 67.132 kg (148 lb)    Physical Exam:  BP 147/49 mmHg  Pulse 80  Temp(Src) 98.6 F (37 C) (Oral)  Resp 18  Ht 5\' 2"  (1.575 m)  Wt 58.695 kg (129 lb 6.4 oz)  BMI 23.66 kg/m2  SpO2 98% Constitutional: She appears well-developed and well-nourished. NAD. HENT: Normocephalic and atraumatic.  Eyes: Conjunctivae are normal.   Cardiovascular: Normal rate and regular rhythm.No murmur heard. Respiratory: Effort normal and breath sounds normal. No stridor. No respiratory distress. She has no wheezes.  GI: Soft. Bowel sounds are normal. She exhibits no distension. There is no tenderness.  Musculoskeletal: She exhibits mild edema and tenderness.  LLE with well healed old surgical incisions.  R-AKA  Neurological: She is alert and oriented.  Motor: B/l UE 5/5 LLE hip flexion 4-/5, knee extension 4/5, ankle dorsi/plantar flexion 4+/5 Right hip flexion 4-/5 (pain inhibition)  Skin: Left heel boggy. AKA with staples C/D/I.  Psychiatric: Her  speech is normal. Her affect is blunt and labile. Cognition and memory are normal.    Assessment/Plan: 1. 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 Physiatrist is providing close team supervision and 24 hour management of active medical problems listed below. Physiatrist and rehab team continue to assess barriers to discharge/monitor patient progress toward functional and medical goals.  Function:  Bathing Bathing position      Bathing parts      Bathing assist        Upper Body Dressing/Undressing Upper body dressing                    Upper body assist        Lower Body Dressing/Undressing Lower body dressing                                  Lower body assist        Toileting Toileting Toileting activity did not occur: No continent bowel/bladder event        Toileting assist     Transfers Chair/bed Physiological scientist  Comprehension Comprehension assist level: Follows basic conversation/direction with no assist  Expression Expression assist level: Expresses basic needs/ideas: With extra time/assistive device  Social Interaction Social Interaction assist level: Interacts appropriately 90% of the time - Needs monitoring or encouragement for participation or interaction.  Problem Solving Problem solving assist level: Solves basic 90% of the time/requires cueing < 10% of the time  Memory Memory assist level: More than reasonable amount of time    Medical Problem List and Plan: 1. Weakness, abnormality of gait, pain secondary to R-AKA  Begin CIR 2. DVT Prophylaxis/Anticoagulation: Pharmaceutical: Lovenox 3. Pain Management: Continue oxycodone prn.  4. Mood: Team to provide ego support. LCSW to follow for evaluation and support.  5. Neuropsych: This patient is capable  of making decisions on her own behalf. 6. Skin/Wound Care: Monitor wound for healing daily.   Prevalon boot to left heel 7. Fluids/Electrolytes/Nutrition: Monitor I/O. Continue boost for supplements and to promote wound healing.  8. HTN: Monitor BP bid. Continue Norvasc and lisinopril  9. Recent subacute GIB: Had negative work up. Monitor H/H.   Monitor for recurrent melena/GIB.   Continue Protonix BID.  10. Acute on chronic anemia: Increase iron to bid.   Hemoglobin 7.8 on 4/11  11. Heme positive stool: Monitor for melena or hematochezia.  13. DMT2: Monitor BS with ac/hs checks. Continue metformin 500 mg bid.   Within acceptable range 14. Thrombocytosis: Likely reactive. Continue to monitor.   Platelets 587 on 4/11 15. Foul urine  Will order UA/U culture  LOS (Days) 1 A FACE TO FACE EVALUATION WAS PERFORMED  Giavana Rooke Lorie Phenix 12/27/2015 8:45 AM

## 2015-12-27 NOTE — Plan of Care (Signed)
Problem: RH BLADDER ELIMINATION Goal: RH STG MANAGE BLADDER WITH ASSISTANCE STG Manage Bladder With Assistance. Min A  Outcome: Not Progressing Total assist, incontinent.   Problem: RH SKIN INTEGRITY Goal: RH STG SKIN FREE OF INFECTION/BREAKDOWN Mod A  Outcome: Not Progressing New stage 2 to sacrum.

## 2015-12-27 NOTE — Care Management Note (Signed)
[facility] Individual Statement of Services  Patient Name:  Jessica Abbott  Date:  12/27/2015  Welcome to the [facility].  Our goal is to provide you with an individualized program based on your diagnosis and situation, designed to meet your specific needs.  With this comprehensive rehabilitation program, you will be expected to participate in at least 3 hours of rehabilitation therapies Monday-Friday, with modified therapy programming on the weekends.  Your rehabilitation program will include the following services:  Physical Therapy (PT), Occupational Therapy (OT), 24 hour per day rehabilitation nursing, Therapeutic Recreaction (TR), Case Management (Social Worker), Rehabilitation Medicine, Nutrition Services and Pharmacy Services  Weekly team conferences will be held on Wednesday to discuss your progress.  Your Social Worker will talk with you frequently to get your input and to update you on team discussions.  Team conferences with you and your family in attendance may also be held.  Expected length of stay: 7 days  Overall anticipated outcome: supervision with cueing  Depending on your progress and recovery, your program may change. Your Social Worker will coordinate services and will keep you informed of any changes. Your Social Worker's name and contact numbers are listed  below.  The following services may also be recommended but are not provided by the [facility]:    [facility] will be made to provide these services after discharge if needed.  Arrangements include referral to agencies that provide these services.  Your insurance has been verified to be:  Medicare Your primary doctor is:  Doug Sou  Pertinent information will be shared with your doctor and your insurance company.  Social Worker:  Ovidio Kin, [place] or ([phone]  Information discussed with and copy given to patient by: [name], 12/27/2015, 9:43 AM

## 2015-12-28 ENCOUNTER — Inpatient Hospital Stay (HOSPITAL_COMMUNITY): Payer: Medicare Other | Admitting: Physical Therapy

## 2015-12-28 ENCOUNTER — Inpatient Hospital Stay (HOSPITAL_COMMUNITY): Payer: Medicare Other | Admitting: Occupational Therapy

## 2015-12-28 DIAGNOSIS — N390 Urinary tract infection, site not specified: Secondary | ICD-10-CM

## 2015-12-28 DIAGNOSIS — B9620 Unspecified Escherichia coli [E. coli] as the cause of diseases classified elsewhere: Secondary | ICD-10-CM | POA: Insufficient documentation

## 2015-12-28 DIAGNOSIS — G546 Phantom limb syndrome with pain: Secondary | ICD-10-CM

## 2015-12-28 LAB — GLUCOSE, CAPILLARY
GLUCOSE-CAPILLARY: 120 mg/dL — AB (ref 65–99)
GLUCOSE-CAPILLARY: 136 mg/dL — AB (ref 65–99)
GLUCOSE-CAPILLARY: 130 mg/dL — AB (ref 65–99)
Glucose-Capillary: 94 mg/dL (ref 65–99)

## 2015-12-28 MED ORDER — POLYETHYLENE GLYCOL 3350 17 G PO PACK
17.0000 g | PACK | Freq: Every day | ORAL | Status: DC
  Administered 2015-12-28 – 2016-01-03 (×7): 17 g via ORAL
  Filled 2015-12-28 (×9): qty 1

## 2015-12-28 MED ORDER — GABAPENTIN 300 MG PO CAPS
300.0000 mg | ORAL_CAPSULE | Freq: Every day | ORAL | Status: DC
  Filled 2015-12-28: qty 1

## 2015-12-28 MED ORDER — MAGNESIUM HYDROXIDE 400 MG/5ML PO SUSP
30.0000 mL | Freq: Once | ORAL | Status: AC
  Administered 2015-12-28: 30 mL via ORAL
  Filled 2015-12-28: qty 30

## 2015-12-28 MED ORDER — SENNOSIDES-DOCUSATE SODIUM 8.6-50 MG PO TABS: 2.0000 | ORAL_TABLET | Freq: Every day | ORAL | Status: DC

## 2015-12-28 MED ORDER — BACLOFEN 5 MG HALF TABLET
5.0000 mg | ORAL_TABLET | Freq: Four times a day (QID) | ORAL | Status: DC
  Administered 2015-12-28 – 2016-01-05 (×31): 5 mg via ORAL
  Filled 2015-12-28 (×31): qty 1

## 2015-12-28 MED ORDER — NITROFURANTOIN MONOHYD MACRO 100 MG PO CAPS
100.0000 mg | ORAL_CAPSULE | Freq: Two times a day (BID) | ORAL | Status: AC
  Administered 2015-12-28 – 2016-01-03 (×14): 100 mg via ORAL
  Filled 2015-12-28 (×15): qty 1

## 2015-12-28 NOTE — Progress Notes (Signed)
Physical Therapy Session Note  Patient Details  Name: Jessica Abbott MRN: [id number] Date of Birth: 1941-01-23  Today's Date: 12/28/2015 PT Individual Time: 1001-1101 PT Individual Time Calculation (min): 60 min   Short Term Goals: Week 1:  PT Short Term Goal 1 (Week 1): STG = LTG due to short ELOS  Skilled Therapeutic Interventions/Progress Updates:    Patient received Supine in bed and agreeable to PT. Patient performed bed mobility for supine to sit with min A, bed rails, and constant instruction for UE placement for prevent LOB. Patient performed sit to stand in Mod A and RW. Compression wrap for L residual limb fell off with sit<>stand Transfer. PT replaced Compression wrap while supine in bed. PT observed patient to have hip flexion spasticity with any touching of the upper thigh, which increased difficulty for donning compression wrap. PT instructed patient in Bed chair transfer with RW with mod A from PT, cues for AD management and UE positioning. Patient reports severe "cramping" pain posterior aspect of R LE while in WC. PT re-applied compression wrap with only minor relief.  WC mobility for 12ft x 2 with supervision A from PT with cues for improved use of UE with turns and around obstacles. Patient Performed stand pivot transfer to mat table in gym with RW. Upon sitting, Patient experienced another "cramp" that lasted for 5 min and would not reduce with PROM or massage from PT. RN present to administer pain medication while in rehab gym. Patient performed bed mobility with Supervision A- minA for sit<>supine x 3 with cues for improve UE use. Patient returned to room in [facility] and left with call bell  Within reach.   Therapy Documentation Precautions:  Precautions Precautions: Fall Precaution Comments: R AKA Restrictions Weight Bearing Restrictions: Yes RLE Weight Bearing: Non weight bearing General:   Vital Signs: Therapy Vitals Temp: 98.4 F (36.9 C) Temp Source: Oral Pulse Rate:  74 Resp: 16 BP: (!) 126/45 mmHg Patient Position (if appropriate): Lying Oxygen Therapy SpO2: 98 % O2 Device: Not Delivered Pain: Pain Assessment Pain Assessment: No/denies pain  Therapy/Group: Individual Therapy  Lorie Phenix 12/28/2015, 5:18 PM

## 2015-12-28 NOTE — Progress Notes (Signed)
Occupational Therapy Session Note  Patient Details  Name: Jessica Abbott MRN: [id number] Date of Birth: 16-Dec-1940  Today's Date: 12/28/2015 OT Individual Time: 0800-0900 OT Individual Time Calculation (min): 60 min    Short Term Goals: Week 1:  OT Short Term Goal 1 (Week 1): STG=LTG d/t short LOS  Skilled Therapeutic Interventions/Progress Updates:    1:1 PT in bed when arrived. Focus on bed mobility, toileting and toilet transfers to drop arm commode. Pt able to come to EOB with more than reasonable amt of time and min A. Pt transferred with mod A to BSC. Pt with difficulty voiding BM- RN came in to assess to help pt. Pt had gotten BM on shrinker; got washed but waiting for it to dry.  Total A for hygiene.  Pt with only one so another one was ordered. Pt had been incontinent of urine in bed. Pt transferred back to bed with mod A and perform bathing and dressing at bed level in long sitting unsupported at times. Pt only has house gown to don here. Discussed having family bring pants and a shoe to practice with.   Therapy Documentation Precautions:  Precautions Precautions: Fall Precaution Comments: R AKA Restrictions Weight Bearing Restrictions: Yes RLE Weight Bearing: Non weight bearing General:   Vital Signs: Therapy Vitals Temp: 98.3 F (36.8 C) Temp Source: Oral Pulse Rate: 78 Resp: 16 BP: 134/66 mmHg Patient Position (if appropriate): Lying Oxygen Therapy SpO2: 98 % O2 Device: Not Delivered Pain: Pain Assessment Pain Assessment: 0-10 Pain Score: 6  Faces Pain Scale: Hurts little more Pain Type: Surgical pain Pain Location: Leg Pain Orientation: Right Pain Descriptors / Indicators: Aching Pain Frequency: Constant Pain Onset: On-going Patients Stated Pain Goal: 2 Pain Intervention(s): Medication (See eMAR) ADL: ADL ADL Comments: see Functional Asessment Tool Exercises:   Other Treatments:    See Function Navigator for Current Functional  Status.   Therapy/Group: Individual Therapy  Willeen Cass [facility] 12/28/2015, 8:29 AM

## 2015-12-28 NOTE — Plan of Care (Signed)
Problem: RH BLADDER ELIMINATION Goal: RH STG MANAGE BLADDER WITH ASSISTANCE STG Manage Bladder With Assistance. Min A  Outcome: Not Progressing Pt incont

## 2015-12-28 NOTE — Progress Notes (Signed)
Occupational Therapy Session Note  Patient Details  Name: Kellen Walkinshaw MRN: [id number] Date of Birth: 1941/02/16  Today's Date: 12/28/2015 OT Individual Time: 1117-1202 OT Individual Time Calculation (min): 45 min    Short Term Goals: Week 1:  OT Short Term Goal 1 (Week 1): STG=LTG d/t short LOS  Skilled Therapeutic Interventions/Progress Updates:    Worked on toilet transfers to drop arm commode over toilet both with and without the RW.  Min assist for both type of transfers.  Worked on standing balance at the sink with min assist while alternating UE reaching in order to work on standing balance.  Pt able to tolerate standing for 2-3 minute intervals.  Had her push her wheelchair down to the therapy gym for work on UE exercises listed below.  Two sets of 10 repetitions completed for each of the listed exercises.  Pt pushed wheelchair to and from the gym with modified independent.  Pt transferred to the bed with mod assist squat pivot to complete the session.  Call button and phone in reach.    Therapy Documentation Precautions:  Precautions Precautions: Fall Precaution Comments: R AKA Restrictions Weight Bearing Restrictions: Yes RLE Weight Bearing: Non weight bearing  Pain: Pain Assessment Pain Assessment: 0-10 Pain Score: 6  Pain Type: Surgical pain Pain Location: Leg Pain Orientation: Right Pain Intervention(s): Repositioned ADL: ADL ADL Comments: see Functional Asessment Tool Exercises: General Exercises - Upper Extremity Shoulder Flexion: Strengthening;10 reps;Both;Seated;Theraband Theraband Level (Shoulder Flexion): Level 2 (Red) Shoulder Horizontal ABduction: Strengthening;Both;10 reps;Seated;Theraband Theraband Level (Shoulder Horizontal Abduction): Level 2 (Red) Elbow Extension: Strengthening;10 reps;Seated;Theraband Theraband Level (Elbow Extension): Level 2 (Red)  See Function Navigator for Current Functional Status.   Therapy/Group: Individual  Therapy  Lauralee Waters OTR/L 12/28/2015, 12:52 PM

## 2015-12-28 NOTE — Progress Notes (Signed)
Physical Therapy Session Note  Patient Details  Name: Jessica Abbott MRN: [id number] Date of Birth: 11-Apr-1941  Today's Date: 12/28/2015 PT Individual Time: 1630-1702 PT Individual Time Calculation (min): 32 min   Short Term Goals: Week 1:  PT Short Term Goal 1 (Week 1): STG = LTG due to short ELOS  Skilled Therapeutic Interventions/Progress Updates:    Pt received in bed, extremely lethargic. PT discussed with RN pt's lethargy & RN reported pt received medication 45 minutes prior to PT arrival. Pt denying pain & BP = 126/45 mmHg, pt agreeable to PT. PT unwrapped R AKA to rewrap residual limb when PT observed small amount of yellow drainage from distal portion of limb. PT notified RN of this, then PT observed pt incontinent in brief. Pt able to roll L & R with bed rails & remain in sidelying for removal of brief & for RN to administer enema. Pt then assisted with transferring bed>BSC via squat pivot and use of bed rails & armrests of BSC. Pt left seated on BSC with RN & NT present at end of session & all needs within reach.   Therapy Documentation Precautions:  Precautions Precautions: Fall Precaution Comments: R AKA Restrictions Weight Bearing Restrictions: Yes RLE Weight Bearing: Non weight bearing   Vital Signs: Therapy Vitals BP: (!) 126/45 mmHg Patient Position (if appropriate): Lying Pain: Pain Assessment Pain Assessment: No/denies pain   See Function Navigator for Current Functional Status.   Therapy/Group: Individual Therapy  Waunita Schooner 12/28/2015, 4:40 PM

## 2015-12-28 NOTE — Progress Notes (Signed)
Jessica Abbott     PROGRESS NOTE  Subjective/Complaints:  Patient sitting up in bed this morning. She complains of difficulty sleeping due to pain in her toes on amputated side.  ROS: + Phantom limb pain. Denies CP, SOB, N/V/D  Objective: Vital Signs: Blood pressure 134/66, pulse 78, temperature 98.3 F (36.8 C), temperature source Oral, resp. rate 16, height 5\' 2"  (1.575 m), weight 58.1 kg (128 lb 1.4 oz), SpO2 98 %. No results found.  Recent Labs  12/27/15 0558  WBC 7.9  HGB 7.8*  HCT 24.9*  PLT 587*    Recent Labs  12/27/15 0558  NA 138  K 4.2  CL 101  GLUCOSE 126*  BUN 11  CREATININE 0.59  CALCIUM 9.0   CBG (last 3)   Recent Labs  12/27/15 1713 12/27/15 2109 12/28/15 0654  GLUCAP 98 133* 94    Wt Readings from Last 3 Encounters:  12/28/15 58.1 kg (128 lb 1.4 oz)  12/09/15 62.143 kg (137 lb)  11/28/15 67.132 kg (148 lb)    Physical Exam:  BP 134/66 mmHg  Pulse 78  Temp(Src) 98.3 F (36.8 C) (Oral)  Resp 16  Ht 5\' 2"  (1.575 m)  Wt 58.1 kg (128 lb 1.4 oz)  BMI 23.42 kg/m2  SpO2 98% Constitutional: She appears well-developed and well-nourished. NAD. HENT: Normocephalic and atraumatic.  Eyes: Conjunctivae are normal.   Cardiovascular: Normal rate and regular rhythm.No murmur heard. Respiratory: Effort normal and breath sounds normal. No stridor. No respiratory distress. She has no wheezes.  GI: Soft. Bowel sounds are normal. She exhibits no distension. There is no tenderness.  Musculoskeletal: She exhibits mild edema and tenderness.  LLE with well healed old surgical incisions.  R-AKA  Neurological: She is alert and oriented.  Motor: B/l UE 5/5 LLE hip flexion 4+/5, knee extension 4+/5, ankle dorsi/plantar flexion 4+/5 Right hip flexion 4+/5 (pain inhibition)  Skin: Left heel boggy. AKA with staples C/D/I.  Psychiatric: Her speech is normal. Her affect is blunt and labile. Cognition and memory are normal.     Assessment/Plan: 1. 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 Physiatrist is providing close team supervision and 24 hour management of active medical problems listed below. Physiatrist and rehab team continue to assess barriers to discharge/monitor patient progress toward functional and medical goals.  Function:  Bathing Bathing position   Position: Sitting EOB  Bathing parts Body parts bathed by patient: Right arm, Left arm, Chest, Abdomen, Front perineal area, Buttocks, Right upper leg, Left upper leg Body parts bathed by helper: Back  Bathing assist Assist Level: Touching or steadying assistance(Pt > 75%)      Upper Body Dressing/Undressing Upper body dressing   What is the patient wearing?: Pull over shirt/dress     Pull over shirt/dress - Perfomed by patient: Thread/unthread right sleeve, Thread/unthread left sleeve, Put head through opening, Pull shirt over trunk          Upper body assist Assist Level: Set up   Set up : To obtain clothing/put away  Lower Body Dressing/Undressing Lower body dressing   What is the patient wearing?: Underwear, Non-skid slipper socks Underwear - Performed by patient: Pull underwear up/down Underwear - Performed by helper: Thread/unthread right underwear leg, Thread/unthread left underwear leg     Non-skid slipper socks- Performed by patient: Don/doff left sock  Lower body assist        Toileting Toileting Toileting activity did not occur: No continent bowel/bladder event Toileting steps completed by patient: Performs perineal hygiene (2/2 steps - removed brief in prep for bath) Toileting steps completed by helper: Adjust clothing prior to toileting    Toileting assist Assist level:  (Max assist)   Transfers Chair/bed transfer   Chair/bed transfer method: Stand pivot Chair/bed transfer assist level:  Touching or steadying assistance (Pt > 75%) Chair/bed transfer assistive device: Bedrails, Armrests     Locomotion Ambulation     Max distance: 15 ft Assist level: Touching or steadying assistance (Pt > 75%)   Wheelchair   Type: Manual Max wheelchair distance: 250  Assist Level: Supervision or verbal cues  Cognition Comprehension Comprehension assist level: Understands complex 90% of the time/cues 10% of the time  Expression Expression assist level: Expresses basic needs/ideas: With extra time/assistive device  Social Interaction Social Interaction assist level: Interacts appropriately with others with medication or extra time (anti-anxiety, antidepressant).  Problem Solving Problem solving assist level: Solves basic 90% of the time/requires cueing < 10% of the time  Memory Memory assist level: More than reasonable amount of time    Medical Problem List and Plan: 1. 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 DVT Prophylaxis/Anticoagulation: Pharmaceutical: Lovenox 3. Pain Management: Continue oxycodone prn.  Gabapentin 300 daily at bedtime started on 4/12  4. Mood: Team to provide ego support. LCSW to follow for evaluation and support.  5. Neuropsych: This patient is capable of making decisions on her own behalf. 6. Skin/Wound Care: Monitor wound for healing daily.   Prevalon boot to left heel 7. Fluids/Electrolytes/Nutrition: Monitor I/O. Continue boost for supplements and to promote wound healing.  8. HTN: Monitor BP bid. Continue Norvasc and lisinopril  9. Recent subacute GIB: Had negative work up. Monitor H/H.   Monitor for recurrent melena/GIB.   Continue Protonix BID.  10. Acute on chronic anemia: Increase iron to bid.   Hemoglobin 7.8 on 4/11  11. Heme positive stool: Monitor for melena or hematochezia.  13. DMT2: Monitor BS with ac/hs checks. Continue metformin 500 mg bid.   Within acceptable rangeat present 14.  Thrombocytosis: Likely reactive. Continue to monitor.   Platelets 587 on 4/11 15. UTI  UA + on 4/11, U culture pending  Will start empiric Macrobid  LOS (Days) 2 A FACE TO FACE EVALUATION WAS PERFORMED  Jessica Abbott 12/28/2015 8:34 AM

## 2015-12-28 NOTE — Patient Care Conference (Signed)
Inpatient RehabilitationTeam Conference and Plan of Care Update Date: 12/28/2015   Time: 11:20 AM    Patient Name: Jessica Abbott      Medical Record Number: [id number]  Date of Birth: 11-Jun-1941 Sex: Female         Room/Bed: 4W06C/4W06C-01 Payor Info: Payor: MEDICARE / Plan: MEDICARE PART A AND B / Product Type: *No Product type* /    Admitting Diagnosis: E Aka  Admit Date/Time:  12/26/2015  4:01 PM Admission Comments: No comment available   Primary Diagnosis:  <principal problem not specified> Principal Problem: <principal problem not specified>  Patient Active Problem List   Diagnosis Date Noted  . Acute lower UTI   . Phantom limb pain ([place])   . S/P AKA (above knee amputation) unilateral ([place]) 12/26/2015  . Unilateral AKA ([place]) 12/26/2015  . Abnormality of gait   . Weakness   . Post-operative pain   . Benign essential HTN   . Anemia of chronic disease   . Type 2 diabetes mellitus with complication, without long-term current use of insulin ([place])   . Thrombocytosis ([place])   . Atherosclerosis of native arteries of extremities with gangrene, right leg ([place]) 12/23/2015  . Bleeding gastrointestinal   . Controlled diabetes mellitus type 2 with complications ([place])   . 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 PVD (peripheral vascular disease) ([place])   . Acute blood loss anemia   . Gangrene of foot ([place])   . Gastrointestinal hemorrhage with melena   . Lactic acidosis   . Acute renal failure ([place])   . Acute GI bleeding 11/22/2015  . Upper GI bleed 11/22/2015    Expected Discharge Date: Expected Discharge Date: 01/05/16  Team Members Present: Physician leading conference: Dr. Delice Lesch Social Worker Present: Lennart Pall, LCSW Nurse Present: Dorien Chihuahua, RN PT Present: Other (comment) Arelia Sneddon, PT and Barrie Folk, PT) OT Present: Willeen Cass, OT SLP Present: Windell Moulding, SLP PPS Coordinator present : Daiva Nakayama, RN, CRRN     Current Status/Progress  Goal Weekly Team Focus  Medical   Weakness, abnormality of gait, pain secondary to R-AKA  Improve safety, transferes, mobility, improve pain, treat UTI  see above   Bowel/Bladder   incont B/B LBM: 12/26/15  cont with timed toileting  timed toileting q2 hours.   Swallow/Nutrition/ Hydration             ADL's   Overall Mod Assist with BADL and transfers  Mod I for BADL sitting at EOB, supervision for transfers [facility])  Transfers, sitting/standing balance, general strengthening, adapted bathing/dressing, desensitization, DME training.   Mobility   min A for stand pivot transfers, min guard for sit<>stand transfers, steady A for ambulating 15 ft with RW, 250 ft with w/c with supervision  mod I in w/c for community ambulation, supervision for dynamic sitting balance, ambulation in household, and transfers  sitting balance, standing tolerance & ambulation, transfers   Communication             Safety/Cognition/ Behavioral Observations            Pain   no complaints of pain   remain pain free  assess and treat pain q shift and PRN    Skin   stage 2 to sacrum, using barrier cream   remain free from further breakdown/ infection  turn q2, assess skin q shift and document findings       *See Care Plan and progress notes for long and short-term goals.  Barriers to Discharge: Phantom limb pain, UTI, ABLA, boggy heal, urinary incontinence    Possible Resolutions to Barriers:  Optimize phantom pain meds, treat UTI, follow labs, monitor left heel    Discharge Planning/Teaching Needs:  HOme with husband-their two daughter's have been rotating to provide both with 24 hr care. Will continue once DC      Team Discussion:   significant spasticity today and BKA.  Very consipated and incont of uring - note + for UTI.  Goals at supervision amb and mod independent w/c level community.    Revisions to Treatment Plan:  None   Continued Need for Acute Rehabilitation Level of Care: The patient requires  daily medical management by a physician with specialized training in physical medicine and rehabilitation for the following conditions: Daily direction of a multidisciplinary physical rehabilitation program to ensure safe treatment while eliciting the highest outcome that is of practical value to the patient.: Yes Daily medical management of patient stability for increased activity during participation in an intensive rehabilitation regime.: Yes Daily analysis of laboratory values and/or radiology reports with any subsequent need for medication adjustment of medical intervention for : Post surgical problems;Diabetes problems;Wound care problems;Urological problems  Jessica Abbott 12/29/2015, 9:06 AM

## 2015-12-29 ENCOUNTER — Inpatient Hospital Stay (HOSPITAL_COMMUNITY): Payer: Medicare Other

## 2015-12-29 ENCOUNTER — Encounter (HOSPITAL_COMMUNITY): Payer: Medicare Other | Admitting: Occupational Therapy

## 2015-12-29 ENCOUNTER — Inpatient Hospital Stay (HOSPITAL_COMMUNITY): Payer: Medicare Other | Admitting: Physical Therapy

## 2015-12-29 ENCOUNTER — Ambulatory Visit (HOSPITAL_COMMUNITY): Payer: Medicare Other | Admitting: Occupational Therapy

## 2015-12-29 ENCOUNTER — Telehealth: Payer: Self-pay | Admitting: Vascular Surgery

## 2015-12-29 LAB — GLUCOSE, CAPILLARY
GLUCOSE-CAPILLARY: 101 mg/dL — AB (ref 65–99)
Glucose-Capillary: 132 mg/dL — ABNORMAL HIGH (ref 65–99)
Glucose-Capillary: 95 mg/dL (ref 65–99)
Glucose-Capillary: 100 mg/dL — ABNORMAL HIGH (ref 65–99)

## 2015-12-29 MED ORDER — GABAPENTIN 100 MG PO CAPS
100.0000 mg | ORAL_CAPSULE | Freq: Every day | ORAL | Status: DC
  Administered 2015-12-30: 100 mg via ORAL
  Filled 2015-12-29 (×3): qty 1

## 2015-12-29 NOTE — Progress Notes (Signed)
Dr. Posey Pronto, MD aware that patient refuses gabapentin and says she has an allergy to it. He also said it was okay to remove her IV. Kennieth Francois, RN

## 2015-12-29 NOTE — Progress Notes (Signed)
MD decreased gabapentin dose due to patient report of it making her to weak in the past. RN attempted to administer the decreased dose, and patient still refused to take it because she fears how it will make her feel. Kennieth Francois, RN

## 2015-12-29 NOTE — Progress Notes (Signed)
Social Work Patient ID: Sibel Stachowicz, female   DOB: 22-Nov-1940, 75 y.o.   MRN: [id number]   Have reviewed team conference with pt and her daughter, [name] (via phone).  Both aware and agreeable with targeted d/c date of 4/20 with supervision goals overall.  Aware that family ed will need to take place next week and that [facility], LCSW will follow up with them on her return.  Cutter Passey, LCSW

## 2015-12-29 NOTE — Progress Notes (Addendum)
Occupational Therapy Session Note  Patient Details  Name: Jessica Abbott MRN: [id number] Date of Birth: [birth year]  Today's Date: 12/29/2015 OT Individual Time:0730-0830 43 Min  Short Term Goals: Week 1:  OT Short Term Goal 1 (Week 1): STG=LTG d/t short LOS  Skilled Therapeutic Interventions/Progress Updates: ADL-retraining at shower level with focus on transfers, dynamic sitting balance, static standing balance and adapted dressing skills.   Pt received supine in bed with breakfast tray completed.   Pt able to complete bed mobility with min vc for technique, extra time, and use of DME.   Pt completes transfer to w/c with mod assist to lift, steadying assist and instructional cues for stand-pivot on LLE.   Pt performs similar transfer to tub bench and bathes with setup and instructional cues on use of bench to perform lateral leans to wash buttocks.   Pt returns to w/c with mod assist to lift from bench.   Pt completes transfer back to bed to dress sitting at EOB and supine to don new brief with min instructional cues to perform pelvic lift from bed while donning brief.    Pt left in bed at end of session with all needs within reach.   OT re-educates pt on plan to perform tub bench transfer in standard tub during next session.  Therapy Documentation Precautions:  Precautions Precautions: Fall Precaution Comments: R AKA Restrictions Weight Bearing Restrictions: Yes RLE Weight Bearing: Non weight bearing  Pain: Pain Assessment Pain Score: Asleep   ADL: ADL ADL Comments: see Functional Asessment Tool  See Function Navigator for Current Functional Status.   Therapy/Group: Individual Therapy  [place] 12/29/2015, 4:10 AM

## 2015-12-29 NOTE — Progress Notes (Signed)
[facility] PHYSICAL MEDICINE & REHABILITATION     PROGRESS NOTE  Subjective/Complaints:  Patient seen sitting up in bed this morning. Told by nursing and by patient that she did not take gabapentin (because she states she is taking it in the past and it has caused weakness. She still complains of phantom limb pain, although it is slightly less than yesterday.  ROS: + Phantom limb pain. Denies CP, SOB, N/V/D  Objective: Vital Signs: Blood pressure 144/50, pulse 65, temperature 98.2 F (36.8 C), temperature source Oral, resp. rate 18, height 5\' 2"  (1.575 m), weight 58.1 kg (128 lb 1.4 oz), SpO2 100 %. No results found.  Recent Labs  12/27/15 0558  WBC 7.9  HGB 7.8*  HCT 24.9*  PLT 587*    Recent Labs  12/27/15 0558  NA 138  K 4.2  CL 101  GLUCOSE 126*  BUN 11  CREATININE 0.59  CALCIUM 9.0   CBG (last 3)   Recent Labs  12/28/15 1632 12/28/15 2122 12/29/15 0658  GLUCAP 136* 130* 101*    Wt Readings from Last 3 Encounters:  12/28/15 58.1 kg (128 lb 1.4 oz)  12/09/15 62.143 kg (137 lb)  11/28/15 67.132 kg (148 lb)    Physical Exam:  BP 144/50 mmHg  Pulse 65  Temp(Src) 98.2 F (36.8 C) (Oral)  Resp 18  Ht 5\' 2"  (1.575 m)  Wt 58.1 kg (128 lb 1.4 oz)  BMI 23.42 kg/m2  SpO2 100% Constitutional: She appears well-developed and well-nourished. NAD. HENT: Normocephalic and atraumatic.  Eyes: Conjunctivae are normal.   Cardiovascular: Normal rate and regular rhythm.No murmur heard. Respiratory: Effort normal and breath sounds normal. No stridor. No respiratory distress. She has no wheezes.  GI: Soft. Bowel sounds are normal. She exhibits no distension. There is no tenderness.  Musculoskeletal: She exhibits mild edema and tenderness.  LLE with well healed old surgical incisions.  R-AKA  Neurological: She is alert and oriented.  Motor: B/l UE 5/5 LLE hip flexion 4+/5, knee extension 4+/5, ankle dorsi/plantar flexion 4+/5 Right hip flexion 4+/5 (pain  inhibition)  Skin: Left heel boggy. AKA with staples C/D/I.  Psychiatric: Her speech is normal. Her affect is blunt and labile. Cognition and memory are normal.    Assessment/Plan: 1. 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 Physiatrist is providing close team supervision and 24 hour management of active medical problems listed below. Physiatrist and rehab team continue to assess barriers to discharge/monitor patient progress toward functional and medical goals.  Function:  Bathing Bathing position   Position: Sitting EOB  Bathing parts Body parts bathed by patient: Right arm, Left arm, Chest, Abdomen, Front perineal area, Buttocks, Right upper leg, Left upper leg Body parts bathed by helper: Back  Bathing assist Assist Level: Touching or steadying assistance(Pt > 75%)      Upper Body Dressing/Undressing Upper body dressing   What is the patient wearing?: Pull over shirt/dress     Pull over shirt/dress - Perfomed by patient: Thread/unthread right sleeve, Thread/unthread left sleeve, Put head through opening, Pull shirt over trunk          Upper body assist Assist Level: Set up   Set up : To obtain clothing/put away  Lower Body Dressing/Undressing Lower body dressing   What is the patient wearing?: Underwear, Non-skid slipper socks Underwear - Performed by patient: Pull underwear up/down Underwear - Performed by helper: Thread/unthread right underwear leg, Thread/unthread  left underwear leg     Non-skid slipper socks- Performed by patient: Don/doff left sock                    Lower body assist        Toileting Toileting Toileting activity did not occur: No continent bowel/bladder event Toileting steps completed by patient: Performs perineal hygiene (2/2 steps - removed brief in prep for bath) Toileting steps completed by helper: Adjust clothing prior to  toileting    Toileting assist Assist level:  (Max assist)   Transfers Chair/bed transfer   Chair/bed transfer method: Squat pivot Chair/bed transfer assist level: Moderate assist (Pt 50 - 74%/lift or lower) Chair/bed transfer assistive device: Walker, Air cabin crew     Max distance: 15 ft Assist level: Touching or steadying assistance (Pt > 75%)   Wheelchair   Type: Manual Max wheelchair distance: 150 Assist Level: Supervision or verbal cues  Cognition Comprehension Comprehension assist level: Understands complex 90% of the time/cues 10% of the time  Expression Expression assist level: Expresses basic needs/ideas: With extra time/assistive device  Social Interaction Social Interaction assist level: Interacts appropriately with others with medication or extra time (anti-anxiety, antidepressant).  Problem Solving Problem solving assist level: Solves basic 90% of the time/requires cueing < 10% of the time  Memory Memory assist level: More than reasonable amount of time    Medical Problem List and Plan: 1. 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 DVT Prophylaxis/Anticoagulation: Pharmaceutical: Lovenox 3. Pain Management: Continue oxycodone prn.  Gabapentin 100 daily at bedtime started on 4/13, patient states that she is taking 300 the past and is causing the weakness. Will try with very low-dose gabapentin and monitor for side effects.  4. Mood: Team to provide ego support. LCSW to follow for evaluation and support.  5. Neuropsych: This patient is capable of making decisions on her own behalf. 6. Skin/Wound Care: Monitor wound for healing daily.   Prevalon boot to left heel 7. Fluids/Electrolytes/Nutrition: Monitor I/O. Continue boost for supplements and to promote wound healing.  8. HTN: Monitor BP bid. Continue Norvasc and lisinopril  9. Recent subacute GIB: Had negative work up. Monitor H/H.   Monitor for recurrent  melena/GIB.   Continue Protonix BID.  10. Acute on chronic anemia: Increase iron to bid.   Hemoglobin 7.8 on 4/11  11. Heme positive stool: Monitor for melena or hematochezia.  13. DMT2: Monitor BS with ac/hs checks. Continue metformin 500 mg bid.   Within acceptable rangeat present 14. Thrombocytosis: Likely reactive. Continue to monitor.   Platelets 587 on 4/11 15. UTI  UA + on 4/11, U culture remains pending  Will start empiric Macrobid  LOS (Days) 3 A FACE TO FACE EVALUATION WAS PERFORMED  Aliahna Statzer Lorie Phenix 12/29/2015 7:15 AM

## 2015-12-29 NOTE — Telephone Encounter (Signed)
-----   Message from Mena Goes, RN sent at 12/26/2015  2:30 PM EDT ----- Regarding: schedule   ----- Message -----    From: Alvia Grove, PA-C    Sent: 12/26/2015   2:02 PM      To: [facility] Charge Pool  S/p right AKA 12/23/15  F/u with Dr. Scot Dock in 4 weeks  Thanks Maudie Mercury

## 2015-12-29 NOTE — Plan of Care (Signed)
Problem: RH BLADDER ELIMINATION Goal: RH STG MANAGE BLADDER WITH ASSISTANCE STG Manage Bladder With Assistance. Min A  Outcome: Not Progressing Pt incontinent      

## 2015-12-29 NOTE — Progress Notes (Signed)
RN went to administer gabapentin added to [facility] today. Pt voiced that she feels like that "sounds like the medication I cannot take." Visitors in the room called more family to ask if that was the medication. Family and pt said she used to take 300mg  at one point and it made her weak to the point she could not walk. Gabapentin added to allergy list. Will pass to care team in the AM. Sandford Craze, Janalyn Harder, RN

## 2015-12-29 NOTE — Progress Notes (Signed)
Physical Therapy Session Note  Patient Details  Name: Jessica Abbott MRN: [id number] Date of Birth: June 08, 1941  Today's Date: 12/29/2015 PT Individual Time: [id number] PT Individual Time Calculation (min): 30 min   Short Term Goals: Week 1:  PT Short Term Goal 1 (Week 1): STG = LTG due to short ELOS  Skilled Therapeutic Interventions/Progress Updates:  No pain reported. Bed mobility in flat bed without rails to simulate home environment.  Therapeutic activity for sitting balance, EOB, reaching within BOS to L and R and forward.  Pt able to reach laterally and down to L without UE support; to reach forward with R hand, L hand support needed.  Scooting forward/backward on bed with use of bil hands, with cues for lateral leans to allow more pelvic dissociation.  Sit>< stand with min/mod assist with RW, gait with min assist in room on level tile. Pt reported soreness R residual limb upon sitting, with assistance needed to scoot toward HOB and lie supine.  Pt left resting in bed with bed alarm set, and all needs within reach; L soft boot donned for L heel protection.  Pt using call bell as she thinks she needs hygiene change.  Pt reported that she ambulates about 25' from bedroom to bathroom , at home.    Therapy Documentation Precautions:  Precautions Precautions: Fall Precaution Comments: R AKA Restrictions Weight Bearing Restrictions: Yes RLE Weight Bearing: Non weight bearing   Pain: Pain Assessment Pain Assessment: No/denies pain       See Function Navigator for Current Functional Status.   Therapy/Group: Individual Therapy  Shakendra Griffeth 12/29/2015, 11:43 AM

## 2015-12-29 NOTE — Progress Notes (Signed)
Occupational Therapy Note  Patient Details  Name: Jessica Abbott MRN: [id number] Date of Birth: 1941/04/03  Today's Date: 12/29/2015 OT Group Time: 1300-1400 OT Group Time Calculation (min): 60 min   No c/o pain  Group therapy with one other  group mate with focus on functional tasks to address core and UB strengthening, ability to maneuver w/c through obstacles, transfer training and scooting while achieving bottom clearance. Pt performed obstacles course of cones with supervision with extra time. UB exercise with weighted ball and orange theraband to address bilateral UE strengthening, w/c pushups, etc. Transfer training to and from mat encouraging other pt and problem solving proper w/c setup.  Transfers were with min A. Scooting down mat with min A for foot positioning and VC to maintain a forward weight shift to increase bottom clearance. Pt appreciated group setting to work on similar activities to improve their functional mobilit   Nicoletta Ba 12/29/2015, 2:20 PM

## 2015-12-29 NOTE — Plan of Care (Signed)
Problem: RH SKIN INTEGRITY Goal: RH STG SKIN FREE OF INFECTION/BREAKDOWN Mod A  Outcome: Not Progressing incont     Goal: RH STG ABLE TO PERFORM INCISION/WOUND CARE W/ASSISTANCE STG Able To Perform Incision/Wound Care With Assistance. Mod A  Outcome: Not Progressing Pt not performing dressing changes at this time. Has been educated, but only assisted by holding her leg in dressing chages

## 2015-12-29 NOTE — Telephone Encounter (Signed)
sched appt 5/3 at 11:15. Spoke to pt's daughter to inform pt of appt.

## 2015-12-29 NOTE — Progress Notes (Signed)
Social Work Patient ID: Jessica Abbott, female   DOB: [birth year], 75 y.o.   MRN: [id number]   Lowella Curb, LCSW Social Worker Signed  Patient Care Conference 12/28/2015  3:10 PM    Expand All Collapse All   Inpatient RehabilitationTeam Conference and Plan of Care Update Date: 12/28/2015   Time: 11:20 AM     Patient Name: Jessica Abbott       Medical Record Number: [id number]  Date of Birth: 1940/11/27 Sex: Female         Room/Bed: 4W06C/4W06C-01 Payor Info: Payor: MEDICARE / Plan: MEDICARE PART A AND B / Product Type: *No Product type* /    Admitting Diagnosis: E Aka   Admit Date/Time:  12/26/2015  4:01 PM Admission Comments: No comment available   Primary Diagnosis:  <principal problem not specified> Principal Problem: <principal problem not specified>    Patient Active Problem List     Diagnosis  Date Noted   .  Acute lower UTI     .  Phantom limb pain ([place])     .  S/P AKA (above knee amputation) unilateral ([place])  12/26/2015   .  Unilateral AKA ([place])  12/26/2015   .  Abnormality of gait     .  Weakness     .  Post-operative pain     .  Benign essential HTN     .  Anemia of chronic disease     .  Type 2 diabetes mellitus with complication, without long-term current use of insulin ([place])     .  Thrombocytosis ([place])     .  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  Bleeding gastrointestinal     .  Controlled diabetes mellitus type 2 with complications ([place])     .  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  PVD (peripheral vascular disease) ([place])     .  Acute blood loss anemia     .  Gangrene of foot ([place])     .  Gastrointestinal hemorrhage with melena     .  Lactic acidosis     .  Acute renal failure ([place])     .  Acute GI bleeding  11/22/2015   .  Upper GI bleed  11/22/2015     Expected Discharge Date: Expected Discharge Date: 01/05/16  Team Members Present: Physician leading conference: Dr. Delice Lesch Social Worker Present: Lennart Pall, LCSW Nurse Present: Dorien Chihuahua, RN PT Present: Other (comment) Arelia Sneddon, PT and Barrie Folk, PT) OT Present: Willeen Cass, OT SLP Present: Windell Moulding, SLP PPS Coordinator present : Daiva Nakayama, RN, CRRN        Current Status/Progress  Goal  Weekly Team Focus   Medical     Weakness, abnormality of gait, pain secondary to R-AKA   Improve safety, transferes, mobility, improve pain, treat UTI   see above   Bowel/Bladder     incont B/B LBM: 12/26/15  cont with timed toileting  timed toileting q2 hours.   Swallow/Nutrition/ Hydration               ADL's     Overall Mod Assist with BADL and transfers   Mod I for BADL sitting at EOB, supervision for transfers [facility])  Transfers, sitting/standing balance, general strengthening, adapted bathing/dressing, desensitization, DME training.    Mobility     min A for stand pivot transfers, min guard for  sit<>stand transfers, steady A for ambulating 15 ft with RW, 250 ft with w/c with supervision  mod I in w/c for community ambulation, supervision for dynamic sitting balance, ambulation in household, and transfers   sitting balance, standing tolerance & ambulation, transfers    Communication               Safety/Cognition/ Behavioral Observations              Pain     no complaints of pain   remain pain free  assess and treat pain q shift and PRN     Skin     stage 2 to sacrum, using barrier cream    remain free from further breakdown/ infection   turn q2, assess skin q shift and document findings       *See Care Plan and progress notes for long and short-term goals.    Barriers to Discharge:  Phantom limb pain, UTI, ABLA, boggy heal, urinary incontinence     Possible Resolutions to Barriers:   Optimize phantom pain meds, treat UTI, follow labs, monitor left heel     Discharge Planning/Teaching Needs:   HOme with husband-their two daughter's have been rotating to provide both with 24 hr  care. Will continue once DC        Team Discussion:     significant spasticity today and BKA.  Very consipated and incont of uring - note + for UTI.  Goals at supervision amb and mod independent w/c level community.     Revisions to Treatment Plan:    None    Continued Need for Acute Rehabilitation Level of Care: The patient requires daily medical management by a physician with specialized training in physical medicine and rehabilitation for the following conditions: Daily direction of a multidisciplinary physical rehabilitation program to ensure safe treatment while eliciting the highest outcome that is of practical value to the patient.: Yes Daily medical management of patient stability for increased activity during participation in an intensive rehabilitation regime.: Yes Daily analysis of laboratory values and/or radiology reports with any subsequent need for medication adjustment of medical intervention for : Post surgical problems;Diabetes problems;Wound care problems;Urological problems  Robi Dewolfe 12/29/2015, 9:06 AM

## 2015-12-29 NOTE — Progress Notes (Signed)
Physical Therapy Session Note  Patient Details  Name: Jessica Abbott MRN: [id number] Date of Birth: 1941/05/01  Today's Date: 12/29/2015 PT Individual Time: 1500-1545 PT Individual Time Calculation (min): 45 min   Short Term Goals: Week 1:  PT Short Term Goal 1 (Week 1): STG = LTG due to short ELOS  Skilled Therapeutic Interventions/Progress Updates:   Patient in wheelchair, propelled to and from gym using BUE with supervision. Patient performed squat pivot transfer wheelchair <> mat table with supervision verbal cues for head hips relationship. Supine on mat with small wedge, patient worked on R hip extension with minimal movement as patient compensates with lumbar extension and limited by R hip flexor spasms to touch (self-touch and therapist). Attempted use of weight across residual limb with minimal impact. 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 Patient instructed in single limb bridging, supine glute sets, and sidelying R hip abduction and R hip extension with PT blocking patient from compensatory lumbar/trunk extension. Patient reported that current Jay2 cushion is hard on her bottom, therefore switched out cushion for Roho cushion and adjusted with patient reporting improved sitting tolerance and comfort. Patient did not recall pressure relief frequency or technique, therefore educated on wheelchair pushups and frequency and duration for pressure relief due to gluteal skin breakdown. Patient verbalized understanding. Patient requested to return to bed, reported urinary incontinence and requested bed pan since wet brief was removed and patient in bed, NT notified of patient position and needs in reach.   Therapy Documentation Precautions:  Precautions Precautions: Fall Precaution Comments: R AKA Restrictions Weight Bearing Restrictions: Yes RLE Weight Bearing: Non weight bearing Pain:  Unrated but c/o soreness in R residual limb  See  Function Navigator for Current Functional Status.   Therapy/Group: Individual Therapy  Laretta Alstrom 12/29/2015, 4:34 PM

## 2015-12-30 ENCOUNTER — Inpatient Hospital Stay (HOSPITAL_COMMUNITY): Payer: Medicare Other

## 2015-12-30 ENCOUNTER — Inpatient Hospital Stay (HOSPITAL_COMMUNITY): Payer: Medicare Other | Admitting: Physical Therapy

## 2015-12-30 ENCOUNTER — Encounter (HOSPITAL_COMMUNITY): Payer: Medicare Other | Admitting: Occupational Therapy

## 2015-12-30 LAB — URINE CULTURE

## 2015-12-30 LAB — GLUCOSE, CAPILLARY
GLUCOSE-CAPILLARY: 89 mg/dL (ref 65–99)
GLUCOSE-CAPILLARY: 142 mg/dL — AB (ref 65–99)
GLUCOSE-CAPILLARY: 93 mg/dL (ref 65–99)
Glucose-Capillary: 102 mg/dL — ABNORMAL HIGH (ref 65–99)

## 2015-12-30 NOTE — Progress Notes (Signed)
PHYSICAL MEDICINE & REHABILITATION     PROGRESS NOTE  Subjective/Complaints:  Patient seen lying in bed this morning. She slept well overnight. She did not take her gabapentin, she feels like she did not need it. She denies phantom limb pain at present.  ROS: Denies  Phantom limb pain, CP, SOB, N/V/D  Objective: Vital Signs: Blood pressure 128/71, pulse 70, temperature 98 F (36.7 C), temperature source Oral, resp. rate 16, height 5\' 2"  (1.575 m), weight 52.6 kg (115 lb 15.4 oz), SpO2 100 %. No results found. No results for input(s): WBC, HGB, HCT, PLT in the last 72 hours. No results for input(s): NA, K, CL, GLUCOSE, BUN, CREATININE, CALCIUM in the last 72 hours.  Invalid input(s): CO CBG (last 3)   Recent Labs  12/29/15 1624 12/29/15 2037 12/30/15 0645  GLUCAP 95 100* 89    Wt Readings from Last 3 Encounters:  12/30/15 52.6 kg (115 lb 15.4 oz)  12/09/15 62.143 kg (137 lb)  11/28/15 67.132 kg (148 lb)    Physical Exam:  BP 128/71 mmHg  Pulse 70  Temp(Src) 98 F (36.7 C) (Oral)  Resp 16  Ht 5\' 2"  (1.575 m)  Wt 52.6 kg (115 lb 15.4 oz)  BMI 21.20 kg/m2  SpO2 100% Constitutional: She appears well-developed and well-nourished. NAD. HENT: Normocephalic and atraumatic.  Eyes: Conjunctivae are normal.   Cardiovascular: Normal rate and regular rhythm.No murmur heard. Respiratory: Effort normal and breath sounds normal. No stridor. No respiratory distress. She has no wheezes.  GI: Soft. Bowel sounds are normal. She exhibits no distension. There is no tenderness.  Musculoskeletal: She exhibits mild edema and tenderness.  LLE with well healed old surgical incisions.  R-AKA  Neurological: She is alert and oriented.  Motor: B/l UE 5/5 LLE hip flexion 5/5, knee extension 5/5, ankle dorsi/plantar flexion 5/5 Right hip flexion 5/5   No resting tone Skin: Left heel boggy. AKA with staples C/D/I.  Psychiatric: Her speech is normal. Her affect is blunt and  labile. Cognition and memory are normal.    Assessment/Plan: 1. 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 Physiatrist is providing close team supervision and 24 hour management of active medical problems listed below. Physiatrist and rehab team continue to assess barriers to discharge/monitor patient progress toward functional and medical goals.  Function:  Bathing Bathing position   Position: Shower  Bathing parts Body parts bathed by patient: Right arm, Left arm, Chest, Abdomen, Front perineal area, Buttocks, Right upper leg, Left upper leg Body parts bathed by helper: Right lower leg, Left lower leg, Back  Bathing assist Assist Level: Set up      Upper Body Dressing/Undressing Upper body dressing   What is the patient wearing?: Bra, Pull over shirt/dress Bra - Perfomed by patient: Thread/unthread right bra strap, Thread/unthread left bra strap, Hook/unhook bra (pull down sports bra)   Pull over shirt/dress - Perfomed by patient: Thread/unthread right sleeve, Thread/unthread left sleeve, Put head through opening, Pull shirt over trunk          Upper body assist Assist Level: Set up   Set up : To obtain clothing/put away  Lower Body Dressing/Undressing Lower body dressing   What is the patient wearing?: Underwear, Non-skid slipper socks Underwear - Performed by patient: Thread/unthread right underwear leg, Thread/unthread left underwear leg, Pull underwear up/down Underwear - Performed by helper: Thread/unthread right underwear leg, Thread/unthread left underwear leg  Non-skid slipper socks- Performed by patient: Don/doff left sock                    Lower body assist Assist for lower body dressing: Touching or steadying assistance (Pt > 75%)      Toileting Toileting Toileting activity did not occur: No continent bowel/bladder event Toileting steps completed by  patient: Performs perineal hygiene (2/2 steps - removed brief in prep for bath) Toileting steps completed by helper: Adjust clothing prior to toileting    Toileting assist Assist level:  (Max assist)   Transfers Chair/bed transfer   Chair/bed transfer method: Squat pivot Chair/bed transfer assist level: Supervision or verbal cues Chair/bed transfer assistive device: Armrests     Locomotion Ambulation     Max distance: 20 Assist level: Touching or steadying assistance (Pt > 75%)   Wheelchair   Type: Manual Max wheelchair distance: 150 Assist Level: Supervision or verbal cues  Cognition Comprehension Comprehension assist level: Understands complex 90% of the time/cues 10% of the time  Expression Expression assist level: Expresses basic needs/ideas: With extra time/assistive device  Social Interaction Social Interaction assist level: Interacts appropriately with others with medication or extra time (anti-anxiety, antidepressant).  Problem Solving Problem solving assist level: Solves basic 90% of the time/requires cueing < 10% of the time  Memory Memory assist level: More than reasonable amount of time    Medical Problem List and Plan: 1. 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 DVT Prophylaxis/Anticoagulation: Pharmaceutical: Lovenox 3. Pain Management: Continue oxycodone prn.  Gabapentin 100 daily at bedtime started on 4/13, patient states that she is taking 300 the past and is causing the weakness. Cont low-dose gabapentin and monitor for side effects.  4. Mood: Team to provide ego support. LCSW to follow for evaluation and support.  5. Neuropsych: This patient is capable of making decisions on her own behalf. 6. Skin/Wound Care: Monitor wound for healing daily.   Prevalon boot to left heel 7. Fluids/Electrolytes/Nutrition: Monitor I/O. Continue boost for supplements and to promote wound healing.  8. HTN: Monitor BP bid. Continue Norvasc  and lisinopril  9. Recent subacute GIB: Had negative work up. Monitor H/H.   Monitor for recurrent melena/GIB.   Continue Protonix BID.  10. Acute on chronic anemia: Increase iron to bid.   Hemoglobin 7.8 on 4/11  11. Heme positive stool: Monitor for melena or hematochezia.  13. DMT2: Monitor BS with ac/hs checks. Continue metformin 500 mg bid.   Continue to be within acceptable range 14. Thrombocytosis: Likely reactive. Continue to monitor.   Platelets 587 on 4/11 15. UTI  UA + on 4/11, U culture suggesting Escherichia coli, sensitivities pending  Continue start empiric Macrobid (4/12-4/19)  LOS (Days) 4 A FACE TO FACE EVALUATION WAS PERFORMED  Keyah Blizard Lorie Phenix 12/30/2015 8:48 AM

## 2015-12-30 NOTE — Progress Notes (Signed)
Occupational Therapy Session Note  Patient Details  Name: Jessica Abbott MRN: [id number] Date of Birth: [birth year]  Today's Date: 12/30/2015 OT Individual Time: 1100-1200 OT Individual Time Calculation (min): 60 min   Short Term Goals: Week 1:  OT Short Term Goal 1 (Week 1): STG=LTG d/t short LOS  Skilled Therapeutic Interventions/Progress Updates: ADL-retraining at shower level with focus on improved transfers, adapted bathing/dressing skills, and effective use of DME.    Pt received in her bed reporting no pain and receptive for bathing.   Pt reports improved mobility, now using RW to perform transfers by standing and taking steps (hopping) with more confidence using her arms for support.   After setup to position w/c pt rises from w/c unassisted at bathroom door and completes mobility toward tub bench.  Pt transfers to bench and requires min setup for supplies to bathe seated on bench, using lateral leans to wash buttocks.   Pt dresses upper body while on bench and returns to w/c to transfer back to bed for assist with donning brief and applying ace wrap to RLE.   Pt requires only setup to don wrap and stump sling.     Therapy Documentation Precautions:  Precautions Precautions: Fall Precaution Comments: R AKA Restrictions Weight Bearing Restrictions: Yes RLE Weight Bearing: Non weight bearing   Pain: No/denies pain   ADL: ADL ADL Comments: see Functional Asessment Tool  See Function Navigator for Current Functional Status.   Therapy/Group: Individual Therapy  [place] 12/30/2015, 12:33 PM

## 2015-12-30 NOTE — Plan of Care (Signed)
Problem: RH BLADDER ELIMINATION Goal: RH STG MANAGE BLADDER WITH ASSISTANCE STG Manage Bladder With Assistance. Min A  Outcome: Not Progressing incont

## 2015-12-30 NOTE — Progress Notes (Addendum)
Occupational Therapy Session Note  Patient Details  Name: Jessica Abbott MRN: [id number] Date of Birth: Dec 12, 1940  Today's Date: 12/30/2015 OT Group Time: 1300-1410 OT Group Time Calculation (min): 70 min   Skilled Therapeutic Interventions/Progress Updates:    Therapeutic activity group: focus on community w/c mobility including navigating on uneven surfaces outside, activity tolerance/ endurance, problem solving clothing with toileting, home setup and safety recommendations.  Pts returned to unit and practiced toileting with [facility] over their own toilet in the bathroom. Pt able to perform toileting with mod A with steady A for transfer. Pt return to bed to rest after group.   Therapy Documentation Precautions:  Precautions Precautions: Fall Precaution Comments: R AKA Restrictions Weight Bearing Restrictions: Yes RLE Weight Bearing: Non weight bearing Pain:   no c/o  See Function Navigator for Current Functional Status.   Therapy/Group: Group Therapy  Willeen Cass [facility] 12/30/2015, 3:46 PM

## 2015-12-30 NOTE — Progress Notes (Signed)
Physical Therapy Session Note  Patient Details  Name: Constanza Menear MRN: [id number] Date of Birth: 12/06/1940  Today's Date: 12/30/2015 PT Individual Time: 0901-1000 PT Individual Time Calculation (min): 59 min   Short Term Goals: Week 1:  PT Short Term Goal 1 (Week 1): STG = LTG due to short ELOS  Skilled Therapeutic Interventions/Progress Updates:    Patient received in bed and agreeable to PT. Patient performed bed mobility for Scooting to [facility], bridge, and Roll r and L with use of bed rail and verbal instruction for proper UE positioning. Patient donned and doffed brief and don Pants with mod A to pull pants to waist for pant. PT also applied compression bandage with use of hip anchor for improve stability of bandage. Wc mobility for 136ft with supervision A and cues for improved obstacle navigaiton. Stand pivot transfer with RW x 8 throughout treatment with CGA. Step training on 1, 4 inch step with BUE support on rail x 5 and Mod A from PT. Patient attempted to lie prone in bed to encourage increase hip extension to near neutral, but unable due to hip flexor contracture and spastic flexion response to pressure on thigh. Mini squats with RW support, standing R LE hip extension, and sitting R LE hip abduction x 10 each movement. PT re-enforced importance of pressure relief on gluteal region 3-4 time per hour, 2 minutes on each side. With instruction for lateral lean R and L for 2 min each, if unable to hold pressup. Patient returned to room in [facility] and left  With call bell within reach.   Therapy Documentation Precautions:  Precautions Precautions: Fall Precaution Comments: R AKA Restrictions Weight Bearing Restrictions: Yes RLE Weight Bearing: Non weight bearing  See Function Navigator for Current Functional Status.   Therapy/Group: Individual Therapy  Lorie Phenix 12/30/2015, 12:52 PM

## 2015-12-31 ENCOUNTER — Inpatient Hospital Stay (HOSPITAL_COMMUNITY): Payer: Medicare Other | Admitting: Occupational Therapy

## 2015-12-31 LAB — GLUCOSE, CAPILLARY
Glucose-Capillary: 116 mg/dL — ABNORMAL HIGH (ref 65–99)
Glucose-Capillary: 151 mg/dL — ABNORMAL HIGH (ref 65–99)
Glucose-Capillary: 137 mg/dL — ABNORMAL HIGH (ref 65–99)

## 2015-12-31 NOTE — Plan of Care (Signed)
Problem: RH BLADDER ELIMINATION Goal: RH STG MANAGE BLADDER WITH ASSISTANCE STG Manage Bladder With Assistance. Min A  Outcome: Not Progressing Patient continues to be incontinent of bladder

## 2015-12-31 NOTE — Progress Notes (Signed)
[place] PHYSICAL MEDICINE & REHABILITATION     PROGRESS NOTE  Subjective/Complaints:  No new issues. Took gabapentin last night. Denies stump pain. Slept well  ROS: Denies  Phantom limb pain, CP, SOB, N/V/D  Objective: Vital Signs: Blood pressure 141/43, pulse 66, temperature 98.6 F (37 C), temperature source Oral, resp. rate 18, height 5\' 2"  (1.575 m), weight 54.2 kg (119 lb 7.8 oz), SpO2 100 %. No results found. No results for input(s): WBC, HGB, HCT, PLT in the last 72 hours. No results for input(s): NA, K, CL, GLUCOSE, BUN, CREATININE, CALCIUM in the last 72 hours.  Invalid input(s): CO CBG (last 3)   Recent Labs  12/30/15 1708 12/30/15 2131 12/31/15 0645  GLUCAP 142* 93 116*    Wt Readings from Last 3 Encounters:  12/31/15 54.2 kg (119 lb 7.8 oz)  12/09/15 62.143 kg (137 lb)  11/28/15 67.132 kg (148 lb)    Physical Exam:  BP 141/43 mmHg  Pulse 66  Temp(Src) 98.6 F (37 C) (Oral)  Resp 18  Ht 5\' 2"  (1.575 m)  Wt 54.2 kg (119 lb 7.8 oz)  BMI 21.85 kg/m2  SpO2 100% Constitutional: She appears well-developed and well-nourished. NAD. HENT: Normocephalic and atraumatic.  Eyes: Conjunctivae are normal.   Cardiovascular: Normal rate and regular rhythm.No murmur heard. Respiratory: Effort normal and breath sounds normal. No stridor. No respiratory distress. She has no wheezes.  GI: Soft. Bowel sounds are normal. She exhibits no distension. There is no tenderness.  Musculoskeletal: She exhibits mild edema and tenderness.  LLE with well healed old surgical incisions.  R-AKA well shaped Neurological: She is alert and oriented.  Motor: B/l UE 5/5 LLE hip flexion 5/5, knee extension 5/5, ankle dorsi/plantar flexion 5/5 Right hip flexion 5/5   No resting tone Skin: Left heel boggy. AKA with staples C/D/I.  Psychiatric: Her speech is normal. Her affect is blunt and labile. Cognition and memory are normal.    Assessment/Plan: 1. Functional deficits secondary  to R-AKA which require 3+ hours per day of interdisciplinary therapy in a comprehensive inpatient rehab setting. Physiatrist is providing close team supervision and 24 hour management of active medical problems listed below. Physiatrist and rehab team continue to assess barriers to discharge/monitor patient progress toward functional and medical goals.  Function:  Bathing Bathing position   Position: Shower  Bathing parts Body parts bathed by patient: Right arm, Left arm, Chest, Abdomen, Front perineal area, Buttocks, Right upper leg, Left upper leg Body parts bathed by helper: Right lower leg, Left lower leg, Back  Bathing assist Assist Level: Set up   Set up : To adjust water temperature  Upper Body Dressing/Undressing Upper body dressing   What is the patient wearing?: Pull over shirt/dress, Bra Bra - Perfomed by patient: Thread/unthread right bra strap, Thread/unthread left bra strap, Hook/unhook bra (pull down sports bra)   Pull over shirt/dress - Perfomed by patient: Thread/unthread right sleeve, Thread/unthread left sleeve, Put head through opening, Pull shirt over trunk          Upper body assist Assist Level: Set up   Set up : To obtain clothing/put away  Lower Body Dressing/Undressing Lower body dressing   What is the patient wearing?: Non-skid slipper socks Underwear - Performed by patient: Thread/unthread right underwear leg, Thread/unthread left underwear leg, Pull underwear up/down Underwear - Performed by helper: Thread/unthread right underwear leg, Thread/unthread left underwear leg     Non-skid slipper socks- Performed by patient: Don/doff left sock  Lower body assist Assist for lower body dressing: Touching or steadying assistance (Pt > 75%)      Toileting Toileting Toileting activity did not occur: No continent bowel/bladder event Toileting steps completed by patient: Performs perineal hygiene (2/2 steps - removed brief in prep for  bath) Toileting steps completed by helper: Adjust clothing prior to toileting    Toileting assist Assist level:  (Max assist)   Transfers Chair/bed transfer   Chair/bed transfer method: Stand pivot Chair/bed transfer assist level: Touching or steadying assistance (Pt > 75%) Chair/bed transfer assistive device: Armrests, Medical sales representative     Max distance: 20 Assist level: Touching or steadying assistance (Pt > 75%)   Wheelchair   Type: Manual Max wheelchair distance: 150 Assist Level: Supervision or verbal cues  Cognition Comprehension Comprehension assist level: Understands complex 90% of the time/cues 10% of the time  Expression Expression assist level: Expresses basic needs/ideas: With extra time/assistive device  Social Interaction Social Interaction assist level: Interacts appropriately with others with medication or extra time (anti-anxiety, antidepressant).  Problem Solving Problem solving assist level: Solves basic 90% of the time/requires cueing < 10% of the time  Memory Memory assist level: More than reasonable amount of time    Medical Problem List and Plan: 1. 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 DVT Prophylaxis/Anticoagulation: Pharmaceutical: Lovenox 3. Pain Management: Continue oxycodone prn.  Gabapentin 100 daily at bedtime started on 4/13- no intolerance as present 4. Mood: Team to provide ego support. LCSW to follow for evaluation and support.  5. Neuropsych: This patient is capable of making decisions on her own behalf. 6. Skin/Wound Care: Monitor wound for healing daily.   Prevalon boot to left heel 7. Fluids/Electrolytes/Nutrition: Monitor I/O. Continue boost for supplements and to promote wound healing.  8. HTN: Monitor BP bid. Continue Norvasc and lisinopril  9. Recent subacute GIB: Had negative work up. Monitor H/H.   Monitor for recurrent melena/GIB.   Continue Protonix BID.  10. Acute on  chronic anemia: Increase iron to bid.   Hemoglobin 7.8 on 4/11  11. Heme positive stool: Monitor for melena or hematochezia.  13. DMT2: Monitor BS with ac/hs checks. Continue metformin 500 mg bid.   Continued reasonable control 14. Thrombocytosis: Likely reactive. Continue to monitor.   Platelets 587 on 4/11 15. UTI   Escherichia coli, sensitive to macrobid---continue through 4/19     LOS (Days) 5 A FACE TO FACE EVALUATION WAS PERFORMED  Josef Tourigny T 12/31/2015 8:20 AM

## 2015-12-31 NOTE — Progress Notes (Signed)
Occupational Therapy Session Note  Patient Details  Name: Jessica Abbott MRN: [id number] Date of Birth: Mar 13, 1941  Today's Date: 12/31/2015 OT Individual Time:  -   1500-1540  (40 min)      Short Term Goals: Week 1:  OT Short Term Goal 1 (Week 1): STG=LTG d/t short LOS      Skilled Therapeutic Interventions/Progress Updates:    Pt. Propelled wc to gym.  Engaged in [place] for 12 minutes at 4 work load going in forward and backward direction.  Pt averaged 29 RPM.  She propelled wc back to room.  Practiced transfer from wc to bed with min to SBA.  OT assisted with wc set up.  Pt postioned in bed with boot on right foot for pressure relief.    Therapy Documentation Precautions:  Precautions Precautions: Fall Precaution Comments: R AKA Restrictions Weight Bearing Restrictions: Yes RLE Weight Bearing: Non weight bearing    Pain:   none   ADL: ADL ADL Comments: see Functional Asessment Tool       See Function Navigator for Current Functional Status.   Therapy/Group: Individual Therapy  Lisa Roca 12/31/2015, 3:30 PM

## 2016-01-01 ENCOUNTER — Inpatient Hospital Stay (HOSPITAL_COMMUNITY): Payer: Medicare Other | Admitting: Physical Therapy

## 2016-01-01 ENCOUNTER — Inpatient Hospital Stay (HOSPITAL_COMMUNITY): Payer: Medicare Other | Admitting: Occupational Therapy

## 2016-01-01 ENCOUNTER — Inpatient Hospital Stay (HOSPITAL_COMMUNITY): Payer: Medicare Other

## 2016-01-01 LAB — GLUCOSE, CAPILLARY
Glucose-Capillary: 92 mg/dL (ref 65–99)
Glucose-Capillary: 84 mg/dL (ref 65–99)
Glucose-Capillary: 112 mg/dL — ABNORMAL HIGH (ref 65–99)
Glucose-Capillary: 91 mg/dL (ref 65–99)

## 2016-01-01 NOTE — Progress Notes (Signed)
Physical Therapy Session Note  Patient Details  Name: Jessica Abbott MRN: [id number] Date of Birth: 1940-11-07  Today's Date: 01/01/2016 PT Individual Time:  [id number] and 1548-1631 PT Individual Time Calculation (min): 56 min and 43 minutes  Short Term Goals: Week 1:  PT Short Term Goal 1 (Week 1): STG = LTG due to short ELOS  Skilled Therapeutic Interventions/Progress Updates:    Treatment 1: Pt received in bed, denying c/o pain & agreeable to PT. Pt able to don new shirt & pants while long sitting & in supine in bed with Mod I with use of bed rails for rolling & Mod I for bridging with LLE. PT provided total A for positioning w/c by bed for time management & pt able to complete squat pivot bed>w/c with supervision; PT provided cuing to turn more towards chair to ensure she sat directly on seat and did not sit with only half her bottom on seat. Pt able to self-propel w/c from room>elevators and outside (>300 ft) without a rest break. Pt required cuing to back w/c onto elevator instead of entering elevator forwards. Pt able to negotiate ramps & uneven surfaces with supervision/Mod I. Pt required cuing on slowing w/c down when descending ramps. In rehab apartment pt able to set up w/c in appropriate position by bed, but required verbal cuing to turn footplate up, swing leg rest out of the way & remove armrest to allow her to complete squat pivot transfer with supervision. Once in bed pt able to demonstrate rolling L<>R, supine<>sit in regular bed with Mod I. Attempted to have pt lie prone but pt with significant hip flexion and unable to extend hip. PT attempted to assist pt by placing pillow under pelvis and to perform PROM R hip extension but pt unable to lie prone without weightbearing through end of residual limb and without pain. PT instructed pt to return to L sidelying & pt performed R hip extension 5 sets of 10 reps with minimal extension ROM. 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  Pt completed squat pivot bed>w/c with supervision & self propelled w/c back to room. In room pt able to position w/c by recliner with cuing from PT to allow enough room to complete transfer; pt able to complete transfer with supervision. At end of session pt left in recliner with all needs within reach.  Treatment 2: Pt received in w/c, denying c/o pain, & agreeable to PT. Pt transferred w/c<>mat with supervision A and good positioning of w/c by mat table. PT measured pt for w/c & pt will need 18x18 w/c. Pt & daughter, Jessica Abbott, reports pt has a single step without railings>platform>single step without railings to enter home. PT educated pt on hopping up step with RW. Pt able to hop to step, requiring cuing for approximation of RW & LLE to edge of step. Pt required physical assistance to transfer RW on/off of step 2/2 unsteadiness during time when standing on LLE only. Pt able to hop up step (6 inches in height) with mod A to clear step, but only Min A to descend step. Pt very fearful & anxious during task. Pt negotiated single step a 2nd time but became extremely fearful & required Max A to advance RW off of step. PT educated pt on need to be confident in her abilities, to follow PT cuing to  and to try to stay calm to help reduce risk of incident/fall. Gait training completed & pt able to ambulate 55 ft with RW & supervision, doing well at utilizing her BUE to push up to allow LLE to advance. Pt completed squat pivot mat table>w/c with supervision & propelled self back to room with BUE & mod I in controlled environment.  Pt completed squat pivot transfer with supervision from w/c>bed & left sitting on EOB with daughter & RN present.   Therapy Documentation Precautions:  Precautions Precautions: Fall Precaution Comments: R AKA Restrictions Weight Bearing Restrictions:  Yes RLE Weight Bearing: Non weight bearing  Pain: Pain Assessment Pain Assessment: No/denies pain   See Function Navigator for Current Functional Status.   Therapy/Group: Individual Therapy  Waunita Schooner 01/01/2016, 8:07 AM

## 2016-01-01 NOTE — Progress Notes (Signed)
[place] PHYSICAL MEDICINE & REHABILITATION     PROGRESS NOTE  Subjective/Complaints:  Complains of spasms in right stump, left leg too. Did not take gabapentin  ROS: Denies  Phantom limb pain, CP, SOB, N/V/D  Objective: Vital Signs: Blood pressure 141/46, pulse 71, temperature 98.6 F (37 C), temperature source Oral, resp. rate 18, height 5\' 2"  (1.575 m), weight 53.071 kg (117 lb), SpO2 98 %. No results found. No results for input(s): WBC, HGB, HCT, PLT in the last 72 hours. No results for input(s): NA, K, CL, GLUCOSE, BUN, CREATININE, CALCIUM in the last 72 hours.  Invalid input(s): CO CBG (last 3)   Recent Labs  12/31/15 1629 12/31/15 2123 01/01/16 0641  GLUCAP 151* 137* 92    Wt Readings from Last 3 Encounters:  01/01/16 53.071 kg (117 lb)  12/09/15 62.143 kg (137 lb)  11/28/15 67.132 kg (148 lb)    Physical Exam:  BP 141/46 mmHg  Pulse 71  Temp(Src) 98.6 F (37 C) (Oral)  Resp 18  Ht 5\' 2"  (1.575 m)  Wt 53.071 kg (117 lb)  BMI 21.39 kg/m2  SpO2 98% Constitutional: She appears well-developed and well-nourished. NAD. HENT: Normocephalic and atraumatic.  Eyes: Conjunctivae are normal.   Cardiovascular: Normal rate and regular rhythm.No murmur heard. Respiratory: Effort normal and breath sounds normal. No stridor. No respiratory distress. She has no wheezes.  GI: Soft. Bowel sounds are normal. She exhibits no distension. There is no tenderness.  Musculoskeletal: She exhibits mild edema and tenderness.  LLE with well healed old surgical incisions.  R-AKA well shaped, full ROM Neurological: She is alert and oriented.  Motor: B/l UE 5/5 LLE hip flexion 5/5, knee extension 5/5, ankle dorsi/plantar flexion 5/5 Right hip flexion 5/5   No resting tone Skin: Left heel boggy. AKA with staples C/D/I.  Psychiatric: Her speech is normal. Her affect is blunt and labile. Cognition and memory are normal.    Assessment/Plan: 1. Functional deficits secondary to  R-AKA which require 3+ hours per day of interdisciplinary therapy in a comprehensive inpatient rehab setting. Physiatrist is providing close team supervision and 24 hour management of active medical problems listed below. Physiatrist and rehab team continue to assess barriers to discharge/monitor patient progress toward functional and medical goals.  Function:  Bathing Bathing position   Position: Shower  Bathing parts Body parts bathed by patient: Right arm, Left arm, Chest, Abdomen, Front perineal area, Buttocks, Right upper leg, Left upper leg Body parts bathed by helper: Right lower leg, Left lower leg, Back  Bathing assist Assist Level: Set up   Set up : To adjust water temperature  Upper Body Dressing/Undressing Upper body dressing   What is the patient wearing?: Pull over shirt/dress, Bra Bra - Perfomed by patient: Thread/unthread right bra strap, Thread/unthread left bra strap, Hook/unhook bra (pull down sports bra)   Pull over shirt/dress - Perfomed by patient: Thread/unthread right sleeve, Thread/unthread left sleeve, Put head through opening, Pull shirt over trunk          Upper body assist Assist Level: Set up   Set up : To obtain clothing/put away  Lower Body Dressing/Undressing Lower body dressing   What is the patient wearing?: Non-skid slipper socks Underwear - Performed by patient: Thread/unthread right underwear leg, Thread/unthread left underwear leg, Pull underwear up/down Underwear - Performed by helper: Thread/unthread right underwear leg, Thread/unthread left underwear leg     Non-skid slipper socks- Performed by patient: Don/doff left sock  Lower body assist Assist for lower body dressing: Touching or steadying assistance (Pt > 75%)      Toileting Toileting Toileting activity did not occur: No continent bowel/bladder event Toileting steps completed by patient: Performs perineal hygiene (2/2 steps - removed brief in prep for  bath) Toileting steps completed by helper: Adjust clothing prior to toileting    Toileting assist Assist level: Two helpers   Transfers Chair/bed transfer   Chair/bed transfer method: Stand pivot Chair/bed transfer assist level: Touching or steadying assistance (Pt > 75%) Chair/bed transfer assistive device: Armrests, Medical sales representative     Max distance: 20 Assist level: Touching or steadying assistance (Pt > 75%)   Wheelchair   Type: Manual Max wheelchair distance: 150 Assist Level: Supervision or verbal cues  Cognition Comprehension Comprehension assist level: Understands complex 90% of the time/cues 10% of the time  Expression Expression assist level: Expresses basic needs/ideas: With extra time/assistive device  Social Interaction Social Interaction assist level: Interacts appropriately with others with medication or extra time (anti-anxiety, antidepressant).  Problem Solving Problem solving assist level: Solves basic 90% of the time/requires cueing < 10% of the time  Memory Memory assist level: More than reasonable amount of time    Medical Problem List and Plan: 1. 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 DVT Prophylaxis/Anticoagulation: Pharmaceutical: Lovenox 3. Pain Management: Continue oxycodone prn.  Gabapentin 100 daily at bedtime started on 4/13- pt refusing to take. Phantom pain minimal ---stop 4. Mood: Team to provide ego support. LCSW to follow for evaluation and support.  5. Neuropsych: This patient is capable of making decisions on her own behalf. 6. Skin/Wound Care: Monitor wound for healing daily.   Prevalon boot to left heel 7. Fluids/Electrolytes/Nutrition: Monitor I/O. Continue boost for supplements and to promote wound healing.  8. HTN: Monitor BP bid. Continue Norvasc and lisinopril  9. Recent subacute GIB: Had negative work up. Monitor H/H.   Monitor for recurrent melena/GIB.   Continue Protonix  BID.  10. Acute on chronic anemia: Increase iron to bid.   Hemoglobin 7.8 on 4/11  11. Heme positive stool: Monitor for melena or hematochezia.  13. DMT2: Monitor BS with ac/hs checks. Continue metformin 500 mg bid.   Continued reasonable control 14. Thrombocytosis: Likely reactive. Continue to monitor.   Platelets 587 on 4/11 15. UTI   Escherichia coli, sensitive to macrobid---continue through 4/19     LOS (Days) 6 A FACE TO FACE EVALUATION WAS PERFORMED  SWARTZ,ZACHARY T 01/01/2016 8:13 AM

## 2016-01-01 NOTE — Progress Notes (Signed)
Occupational Therapy Session Note  Patient Details  Name: Jessica Abbott MRN: [id number] Date of Birth: [birth year]  Today's Date: 01/01/2016 OT Group Time: 1300-1400 79 Min  Short Term Goals: Week 1:  OT Short Term Goal 1 (Week 1): STG=LTG d/t short LOS  Skilled Therapeutic Interventions/Progress Updates: Therapeutic group activity with focus on functional transfers (toilet, w/c, raised mat), dynamic sitting balance, BUE strengthening, and discharge planning.   Pt completes transfer on/off toilet, performing toileting with mod assist to manage clothing.   Pt completes transfer from w/c to/from raised mat and completes 40 reps of string ball toss with group participant demonstrating good unsupported dynamic sitting balance with supervision for safety.   Pt participates in group discussion relating to need for change in clothing choices to simplify BADL, home mods, and delegation of home making.     Therapy Documentation Precautions:  Precautions Precautions: Fall Precaution Comments: R AKA Restrictions Weight Bearing Restrictions: Yes RLE Weight Bearing: Non weight bearing   Pain: No/denies pain  ADL: ADL ADL Comments: see Functional Asessment Tool   See Function Navigator for Current Functional Status.   Therapy/Group: Group Therapy  Jamae Tison 01/01/2016, 3:31 PM

## 2016-01-01 NOTE — Progress Notes (Signed)
At 1956, PRN robitussin given, for cough. At 2148, complained of LLE pain, PRN oxy IR 5mg 's given. Refused scheduled neurontin, reports makes her feel funny. Patrici Ranks A

## 2016-01-01 NOTE — Progress Notes (Signed)
Occupational Therapy Session Note  Patient Details  Name: Jessica Abbott MRN: [id number] Date of Birth: [birth year]  Today's Date: 01/01/2016 OT Individual Time:  - 1500-1530  (30 min)      Short Term Goals: Week 1:  OT Short Term Goal 1 (Week 1): STG=LTG d/t short LOS .     Skilled Therapeutic Interventions/Progress Updates:     Addressed functional task, wc mobility, wc safety, simple meal prep (simulated). Pt propels self to gym in wc.  OT instructs and pt engages in UE strengthening using weighted yellow ball  and 3 # free weight.  Did 3 sets of 10 ball tosses and then Completed UE exercises in all planes.  Propel wc to OT kitchen and simulated cooking an egg with pt going through the motions and verbalizing how she would maneuver and get things in kitchen.  Pt.demonstrated good safety awareness and showed good problem solving and  balance, Pt. Propelled wc to gym.  Pt left in gym with next appointment   Therapy Documentation Precautions:  Precautions Precautions: Fall Precaution Comments: R AKA Restrictions Weight Bearing Restrictions: Yes RLE Weight Bearing: Non weight bearing      Pain:  None verbalized  ADL ADL Comments: see Functional Asessment Tool     See Function Navigator for Current Functional Status.   Therapy/Group: Individual Therapy  Lisa Roca 01/01/2016, 3:01 PM

## 2016-01-02 ENCOUNTER — Inpatient Hospital Stay (HOSPITAL_COMMUNITY): Payer: Medicare Other

## 2016-01-02 ENCOUNTER — Inpatient Hospital Stay (HOSPITAL_COMMUNITY): Payer: Medicare Other | Admitting: Physical Therapy

## 2016-01-02 ENCOUNTER — Encounter (HOSPITAL_COMMUNITY): Payer: Medicare Other

## 2016-01-02 LAB — GLUCOSE, CAPILLARY
GLUCOSE-CAPILLARY: 96 mg/dL (ref 65–99)
GLUCOSE-CAPILLARY: 105 mg/dL — AB (ref 65–99)
Glucose-Capillary: 109 mg/dL — ABNORMAL HIGH (ref 65–99)
Glucose-Capillary: 95 mg/dL (ref 65–99)
Glucose-Capillary: 110 mg/dL — ABNORMAL HIGH (ref 65–99)

## 2016-01-02 LAB — CREATININE, SERUM
CREATININE: 0.57 mg/dL (ref 0.44–1.00)
GFR calc Af Amer: >60 mL/min (ref >60)

## 2016-01-02 NOTE — Progress Notes (Signed)
Recreational Therapy Session Note  Patient Details  Name: Jessica Abbott MRN: [id number] Date of Birth: March 30, 1941 Today's Date: 01/02/2016   Met with pt to discuss TR services with specific focus on use of leisure time post discharge, activity analysis with potential adaptations, and leisure's impact on overall health & wellness.  Pt stated understanding & verbalized that she is anxious to return home and to previously enjoyed activities.  Pt identified energy conservation techniques & simple adaptations to use with/during leisure participation with min cues.  No further TR as pt is expected to discharge home 01/05/16. [place] 01/02/2016, 3:52 PM

## 2016-01-02 NOTE — Plan of Care (Signed)
Problem: RH BLADDER ELIMINATION Goal: RH STG MANAGE BLADDER WITH ASSISTANCE STG Manage Bladder With Assistance. Min A  Outcome: Not Progressing Incontinent at times, requiring total assist with clean up.

## 2016-01-02 NOTE — Progress Notes (Signed)
Occupational Therapy Session Note  Patient Details  Name: Dee Fritchman MRN: [id number] Date of Birth: 12-21-1940  Today's Date: 01/02/2016 OT Individual Time:730-830 68 Min  Short Term Goals: Week 1:  OT Short Term Goal 1 (Week 1): STG=LTG d/t short LOS  Skilled Therapeutic Interventions/Progress Updates: ADL-retraining at shower level with focus on improved functional transfers using RW and lower body dressing skills at EOB/supine.  Pt able to gather her clothing at w/c level and perform transfers with overall supervision/setup assist with RW.   Pt requires steadying assist to transfer to toilet from w/c after bathing but completes clothing management and hygiene using lateral leans, unassisted.   Pt returns to bed to dress d/t use of briefs and dons briefs after setup to attach fasteners first and min instructional cues to bridge pelvis and pull up briefs.   Pt repeats process for pants and returns to w/c to groom at sink at end of session without assist.     Therapy Documentation Precautions:  Precautions Precautions: Fall Precaution Comments: R AKA Restrictions Weight Bearing Restrictions: Yes RLE Weight Bearing: Non weight bearing  Vital Signs: Therapy Vitals BP: (!) 124/44 mmHg  Pain: Pain Assessment Pain Assessment: No/denies pain  ADL: ADL ADL Comments: see Functional Asessment Tool  See Function Navigator for Current Functional Status.  Therapy/Group: Individual Therapy   Second session: OT Group Time: 1300-1400 Time Calculation (min):  60 min  Pain Assessment: No/denies pain   Skilled Therapeutic Interventions: Group activity completed with focus on discharge planning, BUE strengthening, dynamic sitting balance, bathroom transfers to tub bench in standard tub.    Pt participates in 20 minutes of string ball toss with group member, alternating position from unsupported sitting at raised mat and supported sitting in w/c while performing string ball toss to  strengthen shoulders and grip.    Pt enjoys task and reports mild soreness in shoulders from prolonged toss.   Pt able to switch grips effectively to engage in bilateral shoulder horizontal abduction toss and vertical toss (unilateral shoulder flexion).     Pt then self-propels w/c to tub room and completes tub bench transfer session using RW to ambulate from doorway to tub bench as she will at home (pt has shower chair already but requests a tub bench and BSC during this session).  Family members arrived during session and observed supervised transfer to tub bench.   Pt performs transfer w/o physical assist but endorses recommendation to perform transfer with steadying assist provided by caregiver after discharge for safety.   Pt then returns to her room, self-propelling w/c, with family members escorting.      See FIM for current functional status  Therapy/Group: Group Therapy  Lexie Koehl 01/02/2016, 12:43 PM

## 2016-01-02 NOTE — Progress Notes (Signed)
[place] PHYSICAL MEDICINE & REHABILITATION     PROGRESS NOTE  Subjective/Complaints:  Patient sitting up in bed this morning. He states he has occasional Phantom limb pain and does not need any medications. 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 I discussed with patient before regarding use of pillow under amputation site.  ROS: Denies phantom limb pain, CP, SOB, N/V/D  Objective: Vital Signs: Blood pressure 123/41, pulse 67, temperature 98.5 F (36.9 C), temperature source Oral, resp. rate 17, height 5\' 2"  (1.575 m), weight 54.885 kg (121 lb), SpO2 100 %. No results found. No results for input(s): WBC, HGB, HCT, PLT in the last 72 hours.  Recent Labs  01/02/16 0427  CREATININE 0.57   CBG (last 3)   Recent Labs  01/01/16 1631 01/01/16 2124 01/02/16 0655  GLUCAP 112* 91 96    Wt Readings from Last 3 Encounters:  01/02/16 54.885 kg (121 lb)  12/09/15 62.143 kg (137 lb)  11/28/15 67.132 kg (148 lb)    Physical Exam:  BP 123/41 mmHg  Pulse 67  Temp(Src) 98.5 F (36.9 C) (Oral)  Resp 17  Ht 5\' 2"  (1.575 m)  Wt 54.885 kg (121 lb)  BMI 22.13 kg/m2  SpO2 100% Constitutional: She appears well-developed and well-nourished. NAD. HENT: Normocephalic and atraumatic.  Eyes: Conjunctivae are normal.   Cardiovascular: Normal rate and regular rhythm.No murmur heard. Respiratory: Effort normal and breath sounds normal. No stridor. No respiratory distress. She has no wheezes.  GI: Soft. Bowel sounds are normal. She exhibits no distension. There is no tenderness.  Musculoskeletal: She exhibits mild edema and tenderness.  LLE with well healed old surgical incisions.  R-AKA well shaped Neurological: She is alert and oriented.  Motor: B/l UE 5/5 LLE hip flexion 5/5, knee extension 5/5, ankle dorsi/plantar flexion 5/5 Right hip flexion 5/5   No resting tone Skin: Left heel boggy. AKA with staples C/D/I.  Psychiatric: Her speech is  normal. Her affect is blunt. Cognition and memory are normal.    Assessment/Plan: 1. 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 Physiatrist is providing close team supervision and 24 hour management of active medical problems listed below. Physiatrist and rehab team continue to assess barriers to discharge/monitor patient progress toward functional and medical goals.  Function:  Bathing Bathing position   Position: Shower  Bathing parts Body parts bathed by patient: Right arm, Left arm, Chest, Abdomen, Front perineal area, Buttocks, Right upper leg, Left upper leg, Left lower leg Body parts bathed by helper: Right lower leg, Left lower leg, Back  Bathing assist Assist Level: No help, No cues   Set up : To adjust water temperature  Upper Body Dressing/Undressing Upper body dressing   What is the patient wearing?: Bra, Pull over shirt/dress Bra - Perfomed by patient: Thread/unthread right bra strap, Thread/unthread left bra strap, Hook/unhook bra (pull down sports bra)   Pull over shirt/dress - Perfomed by patient: Thread/unthread right sleeve, Thread/unthread left sleeve, Put head through opening, Pull shirt over trunk          Upper body assist Assist Level: No help, No cues   Set up : To obtain clothing/put away  Lower Body Dressing/Undressing Lower body dressing   What is the patient wearing?: Underwear, Pants, Non-skid slipper socks Underwear - Performed by patient: Thread/unthread right underwear leg, Thread/unthread left underwear leg, Pull underwear up/down Underwear - Performed  by helper: Thread/unthread right underwear leg, Thread/unthread left underwear leg Pants- Performed by patient: Thread/unthread right pants leg, Thread/unthread left pants leg, Pull pants up/down, Fasten/unfasten pants   Non-skid slipper socks- Performed by patient: Don/doff left sock                     Lower body assist Assist for lower body dressing: Set up   Set up :  (To prepare breif)  Toileting Toileting Toileting activity did not occur: No continent bowel/bladder event Toileting steps completed by patient: Adjust clothing prior to toileting, Performs perineal hygiene, Adjust clothing after toileting Toileting steps completed by helper: Adjust clothing prior to toileting    Toileting assist Assist level: Supervision or verbal cues   Transfers Chair/bed transfer   Chair/bed transfer method: Stand pivot Chair/bed transfer assist level: Supervision or verbal cues Chair/bed transfer assistive device: Medical sales representative     Max distance: 55 ft Assist level: Supervision or verbal cues   Wheelchair   Type: Manual Max wheelchair distance: >300 ft Assist Level: Supervision or verbal cues  Cognition Comprehension Comprehension assist level: Understands complex 90% of the time/cues 10% of the time  Expression Expression assist level: Expresses complex 90% of the time/cues < 10% of the time  Social Interaction Social Interaction assist level: Interacts appropriately with others - No medications needed.  Problem Solving Problem solving assist level: Solves complex 90% of the time/cues < 10% of the time  Memory Memory assist level: More than reasonable amount of time    Medical Problem List and Plan: 1. 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 DVT Prophylaxis/Anticoagulation: Pharmaceutical: Lovenox 3. Pain Management: Continue oxycodone prn.  Patient offered gabapentin 100 daily at bedtime, however she states she does not needed, stopped. 4. Mood: Team to provide ego support. LCSW to follow for evaluation and support.  5. Neuropsych: This patient is capable of making decisions on her own behalf. 6. Skin/Wound Care: Monitor wound for healing daily.   Continue Prevalon boot to left heel 7.  Fluids/Electrolytes/Nutrition: Monitor I/O. Continue boost for supplements and to promote wound healing.  8. HTN: Monitor BP bid. Continue Norvasc and lisinopril  9. Recent subacute GIB: Had negative work up. Monitor H/H.   Monitor for recurrent melena/GIB.   Continue Protonix BID.  10. Acute on chronic anemia: Increase iron to bid.   Hemoglobin 7.8 on 4/11   Labs ordered for tomorrow 11. Heme positive stool: Monitor for melena or hematochezia.  13. DMT2: Monitor BS with ac/hs checks. Continue metformin 500 mg bid.   Well controlled 14. Thrombocytosis: Likely reactive. Continue to monitor.   Platelets 587 on 4/11  Labs ordered for tomorrow 15. UTI   Escherichia coli, sensitive to macrobid---continue through 4/19     LOS (Days) 7 A FACE TO FACE EVALUATION WAS PERFORMED  [name] 01/02/2016 8:26 AM

## 2016-01-02 NOTE — Progress Notes (Signed)
Physical Therapy Session Note  Patient Details  Name: Jessica Abbott MRN: [id number] Date of Birth: July 05, 1941  Today's Date: 01/02/2016 PT Individual Time: [id number] and [id number]  PT Individual Time Calculation (min): 27 min and 34 minutes  Short Term Goals: Week 1:  PT Short Term Goal 1 (Week 1): STG = LTG due to short ELOS  Skilled Therapeutic Interventions/Progress Updates:    Treatment 1: Pt received in w/c & agreeable to PT, denying c/o pain. Pt able to self propel w/c from room with BUE & Mod I. In gym pt transferred sit>stand with supervision and PT encouraged pt to stand on LLE without BUE support while playing game. When transferring to standing pt only had bedroom slipper that was not stable on floor therefore non-skid sock was utilized. PT encouraged pt to attempt standing without BUE support but pt continued to place hands on tray table, but when she did not she required Mod A for static standing balance. Pt only able to tolerate standing ~3 minutes + ~2 minutes before requiring rest break 2/2 fatigue & unsteadiness. Pt self propelled w/c gym>room & at end of session pt left in w/c with all needs within reach.   Treatment 2: Pt received in w/c & agreeable to PT, denying c/o pain. Pt reports she only has bedroom shoes here and they are slippery on floor. PT educated pt on need to get tennis shoes or some other form of stable shoe to protect L foot & pt agreeable.  Pt self propelled w/c with mod I in controlled environment from [employer]. Pt able to complete car transfer from w/c with supervision via squat pivot. Pt able to open door & manage leg rests only requiring minimal verbal cuing. Pt then ambulated 70 ft in rehab apartment with supervision & RW before requiring rest break 2/2 fatigue. Pt then self propelled w/c back to room & left in w/c with all needs within reach.    Therapy Documentation Precautions:  Precautions Precautions: Fall Precaution Comments: R  AKA Restrictions Weight Bearing Restrictions: Yes RLE Weight Bearing: Non weight bearing  Pain: Pain Assessment Pain Assessment: No/denies pain   See Function Navigator for Current Functional Status.   Therapy/Group: Individual Therapy  Waunita Schooner 01/02/2016, 9:28 AM

## 2016-01-02 NOTE — Progress Notes (Signed)
Without complaint of pain during night. Wearing prevalon boot intermittently, for boggy left heel. PRN Robitussin DM given at 2007 & 0300, for cough. Jessica Abbott A

## 2016-01-02 NOTE — Progress Notes (Signed)
   VASCULAR SURGERY ASSESSMENT & PLAN:  POD 10 S/P Right AKA  Doing well in Rehab.  Home later this week  I will arrange f/u for staple removal.  SUBJECTIVE: No complaints  PHYSICAL EXAM: Filed Vitals:   12/31/15 1408 01/01/16 0433 01/02/16 0447 01/02/16 0921  BP: 141/45 141/46 123/41 124/44  Pulse: 72 71 67   Temp: 98.6 F (37 C) 98.6 F (37 C) 98.5 F (36.9 C)   TempSrc: Oral Oral Oral   Resp: 18 18 17    Height:      Weight:  117 lb (53.071 kg) 121 lb (54.885 kg)   SpO2: 100% 98% 100%    Right AKA healing nicely.  LABS: Lab Results  Component Value Date   WBC 7.9 12/27/2015   HGB 7.8* 12/27/2015   HCT 24.9* 12/27/2015   MCV 73.0* 12/27/2015   PLT 587* 12/27/2015   Lab Results  Component Value Date   CREATININE 0.57 01/02/2016   Lab Results  Component Value Date   INR 1.56* 11/25/2015   CBG (last 3)   Recent Labs  01/01/16 1631 01/01/16 2124 01/02/16 0655  GLUCAP 112* 91 96    Active Problems:   S/P AKA (above knee amputation) unilateral (HCC)   Abnormality of gait   Weakness   Post-operative pain   Benign essential HTN   Anemia of chronic disease   Type 2 diabetes mellitus with complication, without long-term current use of insulin (HCC)   Thrombocytosis (HCC)   Unilateral AKA ([place])   Acute lower UTI   Phantom limb pain [facility])    Gae Gallop [id number] 01/02/2016

## 2016-01-02 NOTE — Plan of Care (Signed)
Problem: RH Stairs Goal: LTG Patient will ambulate up and down stairs w/assist (PT) LTG: Patient will ambulate up and down # of stairs with assistance (PT)  1 step without rails with LRAD; downgraded 2/2 slow progress

## 2016-01-03 ENCOUNTER — Inpatient Hospital Stay (HOSPITAL_COMMUNITY): Payer: Medicare Other

## 2016-01-03 ENCOUNTER — Inpatient Hospital Stay (HOSPITAL_COMMUNITY): Payer: Medicare Other | Admitting: Physical Therapy

## 2016-01-03 ENCOUNTER — Inpatient Hospital Stay (HOSPITAL_COMMUNITY): Payer: Medicare Other | Admitting: Occupational Therapy

## 2016-01-03 LAB — CBC WITH DIFFERENTIAL/PLATELET
BASOS ABS: 0.1 10*3/uL (ref 0.0–0.1)
BASOS PCT: 1 %
EOS ABS: 0.3 10*3/uL (ref 0.0–0.7)
Eosinophils Relative: 5 %
HEMATOCRIT: 28.6 % — AB (ref 36.0–46.0)
HEMOGLOBIN: 8.8 g/dL — AB (ref 12.0–15.0)
LYMPHS PCT: 27 %
Lymphs Abs: 1.7 10*3/uL (ref 0.7–4.0)
MCH: 23.2 pg — ABNORMAL LOW (ref 26.0–34.0)
MCHC: 30.8 g/dL (ref 30.0–36.0)
MCV: 75.3 fL — ABNORMAL LOW (ref 78.0–100.0)
MONOS PCT: 11 %
Monocytes Absolute: 0.7 10*3/uL (ref 0.1–1.0)
NEUTROS ABS: 3.4 10*3/uL (ref 1.7–7.7)
NEUTROS PCT: 56 %
Platelets: 462 10*3/uL — ABNORMAL HIGH (ref 150–400)
RBC: 3.8 MIL/uL — ABNORMAL LOW (ref 3.87–5.11)
RDW: 24.9 % — ABNORMAL HIGH (ref 11.5–15.5)
WBC: 6.2 10*3/uL (ref 4.0–10.5)

## 2016-01-03 LAB — GLUCOSE, CAPILLARY
GLUCOSE-CAPILLARY: 105 mg/dL — AB (ref 65–99)
Glucose-Capillary: 80 mg/dL (ref 65–99)
Glucose-Capillary: 142 mg/dL — ABNORMAL HIGH (ref 65–99)
Glucose-Capillary: 97 mg/dL (ref 65–99)

## 2016-01-03 LAB — BASIC METABOLIC PANEL
ANION GAP: 8 (ref 5–15)
BUN: 17 mg/dL (ref 6–20)
CHLORIDE: 104 mmol/L (ref 101–111)
CO2: 23 mmol/L (ref 22–32)
CREATININE: 0.64 mg/dL (ref 0.44–1.00)
Calcium: 9.2 mg/dL (ref 8.9–10.3)
GFR calc non Af Amer: >60 mL/min (ref >60)
Glucose, Bld: 118 mg/dL — ABNORMAL HIGH (ref 65–99)
POTASSIUM: 4.4 mmol/L (ref 3.5–5.1)
SODIUM: 135 mmol/L (ref 135–145)

## 2016-01-03 NOTE — Progress Notes (Signed)
Occupational Therapy Session Note  Patient Details  Name: Jessica Abbott MRN: [id number] Date of Birth: 01-04-1941  Today's Date: 01/03/2016 OT Individual Time: 1100-1200 OT Individual Time Calculation (min): 60 min    Short Term Goals: Week 1:  OT Short Term Goal 1 (Week 1): STG=LTG d/t short LOS  Skilled Therapeutic Interventions/Progress Updates: ADL-retraining with focus on family ed (dtr Ivin Booty present) on setup/supervision assist for BADL, steadying assist during tub bench transfer (in tub/shower at ADL apt), kitchen safety, RLE stretches, introduction to use of TENs, and re-ed on desensitiztion/phantom pain mgmt.   Dtr performed all transfer but required repeated cues to maintain contact and follow pt versus lead her.   Pt required mod assist to manage clothing after BM while standing supported at RW d/t wearing pants and brief but reports she will adapt by wearing gowns versus pants at home.   OT provided written instructions on TENS, Phantom Pain, and Post Amputation Care (desensitization, included), placed in Patient Handbook.     Therapy Documentation Precautions:  Precautions Precautions: Fall Precaution Comments: R AKA Restrictions Weight Bearing Restrictions: Yes RLE Weight Bearing: Non weight bearing  Vital Signs: Therapy Vitals BP: (!) 150/64 mmHg   Pain: Pain Assessment Pain Assessment: 0-10 Pain Score: 6  Pain Type: Phantom pain Pain Location: Leg Pain Orientation: Right;Distal Pain Frequency: Intermittent Pain Onset: With Activity Patients Stated Pain Goal: 3 Pain Intervention(s): Repositioned;Distraction;Cutaneous stimulation;Therapeutic touch   ADL: ADL ADL Comments: see Functional Asessment Tool  See Function Navigator for Current Functional Status.   Therapy/Group: Individual Therapy  [place] 01/03/2016, 12:32 PM

## 2016-01-03 NOTE — Progress Notes (Addendum)
Occupational Therapy Session Note  Patient Details  Name: Jessica Abbott MRN: [id number] Date of Birth: 1940/11/04  Today's Date: 01/03/2016 OT Individual Time: 1300-1329 OT Individual Time Calculation (min): 29 min    Skilled Therapeutic Interventions/Progress Updates:   Pt rolled herself down to the gym for therapy with modified independence.  Used UE ergonometer for 2 sets of 4 mins each.  Resistance set on level 10 for both sets with RPMs maintained at 24-26.  First set completed peddling forward and the second set peddling backwards.  Next had pt work on sit to stand and standing with the RW for support.  Supervision to complete this including ambulating in the gym to a target and back.  Pt returned to room at end of session via wheelchair with call button and phone within reach.      Therapy Documentation Precautions:  Precautions Precautions: Fall Precaution Comments: R AKA Restrictions Weight Bearing Restrictions: No RLE Weight Bearing: Non weight bearing  Pain: Pain Assessment Pain Assessment: Faces Faces Pain Scale: Hurts a little bit Pain Type: Phantom pain Pain Location: Leg Pain Orientation: Right Pain Intervention(s): Repositioned;Emotional support ADL: See Function Navigator for Current Functional Status.   Therapy/Group: Individual Therapy  Maalle Starrett OTR/L 01/03/2016, 3:50 PM

## 2016-01-03 NOTE — Progress Notes (Signed)
Social Work Patient ID: Jessica Abbott, female   DOB: [birth year], 75 y.o.   MRN: [id number] Spoke with Gloria-daughter who report her sister Jessica Abbott will be here today from 10:00-12:00 for family education. Contacted [facility] since pt was active with them prior to admission to resume services. Have ordered equipment should be here today or tomorrow in room. Work toward discharge Thursday.

## 2016-01-03 NOTE — Progress Notes (Signed)
Nutrition Follow-up  DOCUMENTATION CODES:   Not applicable  INTERVENTION:  Continue Boost Plus po BID between meals, each supplement provides 360 kcal and 14 grams of protein.   Encourage adequate PO intake.   NUTRITION DIAGNOSIS:   Increased nutrient needs related to wound healing as evidenced by estimated needs; ongoing  GOAL:   Patient will meet greater than or equal to 90% of their needs; met  MONITOR:   PO intake, Supplement acceptance, Weight trends, Labs, I & O's, Skin  REASON FOR ASSESSMENT:   Malnutrition Screening Tool    ASSESSMENT:   75 y.o. female with history of HTN, DMT2, CKD, pulmonary HTN, chronic right knee contracture, PVD s/p left femoral to dorsalis pedis bypass who developed right foot wound and superficial occlusion but declined AKA and anemia due to subacute GIB 11/2015 (negative EGD/colonoscopy).  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  Meal completion has been 50-100% with po intake today 75-90%. Pt currently has Boost Plus ordered and has been consuming them. RD to continue with current orders. Pt encouraged to eat her food at meals and to consumer her supplements.   Labs and medications reviewed.   Diet Order:  Diet Carb Modified Fluid consistency:: Thin; Room service appropriate?: Yes  Skin:  Wound (see comment) (Stage 2 pressure ulcer on L buttocks)  Last BM:  4/18  Height:   Ht Readings from Last 1 Encounters:  12/26/15 '5\' 2"'  (1.575 m)    Weight:   Wt Readings from Last 1 Encounters:  01/03/16 121 lb 8 oz (55.112 kg)    Ideal Body Weight:  46 kg (adjusted for AKA)  BMI:  Body mass index is 22.22 kg/(m^2).  Estimated Nutritional Needs:   Kcal:  1700-1850  Protein:  80-90 grams  Fluid:  1.6 - 1.8 L/day  EDUCATION NEEDS:   No education needs identified at this time  Corrin Parker, MS, RD, LDN Pager # [phone] After hours/ weekend pager # [phone]

## 2016-01-03 NOTE — Progress Notes (Signed)
Physical Therapy Session Note  Patient Details  Name: Jessica Abbott MRN: [id number] Date of Birth: 1940-09-23  Today's Date: 01/03/2016 PT Individual Time: 0803-0900 PT Individual Time Calculation (min): 57 min   Short Term Goals: Week 1:  PT Short Term Goal 1 (Week 1): STG = LTG due to short ELOS  Skilled Therapeutic Interventions/Progress Updates:    Pt received in bed & agreeable to PT, denying c/o pain. Pt reports her daughter plans to come in today for family training but can only be present for OT rehab sessions. PT inquired if pt's daughter could be present during a PT session & pt reports daughter cannot come any other time today. Pt called daughter & requested she try to come in this morning or tomorrow at 10 am for training; daughter plans to come in for 10 am PT session on this date. Pt reports she does not have any shoes here besides bedroom shoes, and they are extremely slick to walk on; PT again requested that pt have family bring in shoes that are more stable & provide more protection to L foot. Pt able to doff old and don new clothes while in bed with mod I. Pt self propelled w/c room>gym with Mod I & once in gym pt agreeable to practicing negotiating step but hopes to have family member bump pt up step while sitting in w/c. Once standing at edge of step pt required mod/max A from PT to transfer RW up onto step but was able to ascend step with LLE with Min A. Once at top of step pt became very fearful reporting "I just can't do this" & beginning to become tearful over ascending step. Pt requested to sit down & transferred to sitting at edge of mat. In sitting, without BUE or BLE support pt played Wii bowling to challenge balance. Pt did not experience any loss of balance during activity. Pt able to complete stand pivot mat table>w/c with supervision progressing to mod I & self propel w/c back to room. Once in room pt left in w/c with all needs within reach.   Therapy  Documentation Precautions:  Precautions Precautions: Fall Precaution Comments: R AKA Restrictions Weight Bearing Restrictions: Yes RLE Weight Bearing: Non weight bearing  Pain: Pain Assessment Pain Assessment: No/denies pain   See Function Navigator for Current Functional Status.   Therapy/Group: Individual Therapy  Jessica Abbott 01/03/2016, 8:07 AM

## 2016-01-03 NOTE — Progress Notes (Signed)
Physical Therapy Session Note  Patient Details  Name: Jessica Abbott MRN: [id number] Date of Birth: [birth year]  Today's Date: 01/03/2016 PT Individual Time: 1010-1100 PT Individual Time Calculation (min): 50 min   Short Term Goals: Week 1:  PT Short Term Goal 1 (Week 1): STG = LTG due to short ELOS  Skilled Therapeutic Interventions/Progress Updates:    Session started late 2/2 nursing care.  Pt received resting in w/c with no c/o pain and agreeable to session.  Session focus on family education for upcoming d/c.  Pt propels w/c throughout unit mod I and performs squat/pivot transfers throughout session with supervision and occasional verbal cues for w/c set up.  PT demonstrated to family member bumping w/c up 1 step and family member returned demonstration using appropriate body mechanics and providing pt with appropriate verbal cues.  Pt performed transfers in therapy apartment w/c<>bed and w/c<>low couch with family providing supervision.  PT offered verbal cues prior to transfers regarding positioning of w/c, legs rests, and armrests and family required no repeat instructions during transfer.  PT demonstrated car transfer with pt squat/pivot and supervision and family returned demonstration.  Pt negotiated ramp in w/c mod I.  Pt returned to room at end of session and family states they have no further questions at this time.  Pt left in w/c with call bell in reach and needs met.   Therapy Documentation Precautions:  Precautions Precautions: Fall Precaution Comments: R AKA Restrictions Weight Bearing Restrictions: Yes RLE Weight Bearing: Non weight bearing General: PT Amount of Missed Time (min): 10 Minutes PT Missed Treatment Reason: Nursing care See Function Navigator for Current Functional Status.   Therapy/Group: Individual Therapy  Takisha Pelle E Penven-Crew 01/03/2016, 11:05 AM

## 2016-01-03 NOTE — Plan of Care (Signed)
Problem: RH PAIN MANAGEMENT Goal: RH STG PAIN MANAGED AT OR BELOW PT'S PAIN GOAL <4  Outcome: Progressing No c/o pain     

## 2016-01-03 NOTE — Plan of Care (Signed)
Problem: RH BLADDER ELIMINATION Goal: RH STG MANAGE BLADDER WITH ASSISTANCE STG Manage Bladder With Assistance. Min A  Outcome: Not Progressing Incontinent x 1 due to urgency

## 2016-01-03 NOTE — Progress Notes (Signed)
[place] PHYSICAL MEDICINE & REHABILITATION     PROGRESS NOTE  Subjective/Complaints:  Patient sitting up in bed this morning. She denies phantom limb pain or incisional pain. She notes she is sleeping well overnight.  ROS: Denies phantom limb pain, incisional pain, CP, SOB, N/V/D  Objective: Vital Signs: Blood pressure 135/44, pulse 70, temperature 99 F (37.2 C), temperature source Oral, resp. rate 18, height 5\' 2"  (1.575 m), weight 55.112 kg (121 lb 8 oz), SpO2 100 %. No results found.  Recent Labs  01/03/16 0550  WBC 6.2  HGB 8.8*  HCT 28.6*  PLT 462*    Recent Labs  01/02/16 0427  CREATININE 0.57   CBG (last 3)   Recent Labs  01/02/16 1711 01/02/16 2118 01/03/16 0721  GLUCAP 110* 105* 105*    Wt Readings from Last 3 Encounters:  01/03/16 55.112 kg (121 lb 8 oz)  12/09/15 62.143 kg (137 lb)  11/28/15 67.132 kg (148 lb)    Physical Exam:  BP 135/44 mmHg  Pulse 70  Temp(Src) 99 F (37.2 C) (Oral)  Resp 18  Ht 5\' 2"  (1.575 m)  Wt 55.112 kg (121 lb 8 oz)  BMI 22.22 kg/m2  SpO2 100% Constitutional: She appears well-developed and well-nourished. NAD. HENT: Normocephalic and atraumatic.  Eyes: Conjunctivae are normal.   Cardiovascular: Normal rate and regular rhythm.No murmur heard. Respiratory: Effort normal and breath sounds normal. No stridor. No respiratory distress. She has no wheezes.  GI: Soft. Bowel sounds are normal. She exhibits no distension. There is no tenderness.  Musculoskeletal: She exhibits mild edema and Mild tenderness.  LLE with well healed old surgical incisions.  R-AKA well shaped Neurological: She is alert and oriented.  Motor: B/l UE 5/5 LLE hip flexion 5/5, knee extension 5/5, ankle dorsi/plantar flexion 5/5 Right hip flexion 5/5   No resting tone Skin: Left heel boggy. AKA with staples C/D/I.  Psychiatric: Her speech is normal. Her affect is blunt. Cognition and memory are normal.    Assessment/Plan: 1. Functional  deficits secondary to R-AKA which require 3+ hours per day of interdisciplinary therapy in a comprehensive inpatient rehab setting. Physiatrist is providing close team supervision and 24 hour management of active medical problems listed below. Physiatrist and rehab team continue to assess barriers to discharge/monitor patient progress toward functional and medical goals.  Function:  Bathing Bathing position   Position: Shower  Bathing parts Body parts bathed by patient: Right arm, Left arm, Chest, Abdomen, Front perineal area, Buttocks, Right upper leg, Left upper leg, Left lower leg Body parts bathed by helper: Right lower leg, Left lower leg, Back  Bathing assist Assist Level: No help, No cues   Set up : To adjust water temperature  Upper Body Dressing/Undressing Upper body dressing   What is the patient wearing?: Bra, Pull over shirt/dress Bra - Perfomed by patient: Thread/unthread right bra strap, Thread/unthread left bra strap, Hook/unhook bra (pull down sports bra)   Pull over shirt/dress - Perfomed by patient: Thread/unthread right sleeve, Thread/unthread left sleeve, Put head through opening, Pull shirt over trunk          Upper body assist Assist Level: No help, No cues   Set up : To obtain clothing/put away  Lower Body Dressing/Undressing Lower body dressing   What is the patient wearing?: Underwear, Pants, Non-skid slipper socks Underwear - Performed by patient: Thread/unthread right underwear leg, Thread/unthread left underwear leg, Pull underwear up/down Underwear - Performed by helper: Thread/unthread right underwear leg, Thread/unthread left underwear  leg Pants- Performed by patient: Thread/unthread right pants leg, Thread/unthread left pants leg, Pull pants up/down, Fasten/unfasten pants   Non-skid slipper socks- Performed by patient: Don/doff left sock                    Lower body assist Assist for lower body dressing: Set up   Set up :  (To prepare  breif)  Toileting Toileting Toileting activity did not occur: No continent bowel/bladder event Toileting steps completed by patient: Adjust clothing prior to toileting, Performs perineal hygiene, Adjust clothing after toileting Toileting steps completed by helper: Adjust clothing prior to toileting    Toileting assist Assist level: Supervision or verbal cues   Transfers Chair/bed transfer   Chair/bed transfer method: Stand pivot Chair/bed transfer assist level: Supervision or verbal cues Chair/bed transfer assistive device: Medical sales representative     Max distance: 70 Assist level: Supervision or verbal cues   Wheelchair   Type: Manual Max wheelchair distance: 200 Assist Level: No help, No cues, assistive device, takes more than reasonable amount of time  Cognition Comprehension Comprehension assist level: Understands complex 90% of the time/cues 10% of the time  Expression Expression assist level: Expresses basic needs/ideas: With extra time/assistive device  Social Interaction Social Interaction assist level: Interacts appropriately with others with medication or extra time (anti-anxiety, antidepressant).  Problem Solving Problem solving assist level: Solves basic 90% of the time/requires cueing < 10% of the time  Memory Memory assist level: More than reasonable amount of time    Medical Problem List and Plan: 1. 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 DVT Prophylaxis/Anticoagulation: Pharmaceutical: Lovenox 3. Pain Management: Continue oxycodone prn.  Patient offered gabapentin 100 daily at bedtime, however she states she does not needed, stopped. 4. Mood: Team to provide ego support. LCSW to follow for evaluation and support.  5. Neuropsych: This patient is capable of making decisions on her own behalf. 6. Skin/Wound Care: Monitor wound for healing daily.   Continue Prevalon boot to left heel 7. Fluids/Electrolytes/Nutrition:  Monitor I/O. Continue boost for supplements and to promote wound healing.  8. HTN: Monitor BP bid. Continue Norvasc and lisinopril  9. Recent subacute GIB: Had negative work up. Monitor H/H.   Monitor for recurrent melena/GIB.   Continue Protonix BID.  10. Acute on chronic anemia: Increase iron to bid.   Hemoglobin 8.8 on 4/18 (improved)  11. Heme positive stool: Monitor for melena or hematochezia.  13. DMT2: Monitor BS with ac/hs checks. Continue metformin 500 mg bid.   Well controlled 14. Thrombocytosis: Likely reactive. Continue to monitor.   Platelets 462 on 4/18 (improved) 15. UTI   Escherichia coli, sensitive to macrobid---continue through 4/19     LOS (Days) 8 A FACE TO FACE EVALUATION WAS PERFORMED  Zymier Rodgers Lorie Phenix 01/03/2016 8:30 AM

## 2016-01-04 ENCOUNTER — Inpatient Hospital Stay (HOSPITAL_COMMUNITY): Payer: Medicare Other | Admitting: Physical Therapy

## 2016-01-04 ENCOUNTER — Inpatient Hospital Stay (HOSPITAL_COMMUNITY): Payer: Medicare Other | Admitting: Occupational Therapy

## 2016-01-04 DIAGNOSIS — R030 Elevated blood-pressure reading, without diagnosis of hypertension: Secondary | ICD-10-CM

## 2016-01-04 DIAGNOSIS — R0989 Other specified symptoms and signs involving the circulatory and respiratory systems: Secondary | ICD-10-CM | POA: Insufficient documentation

## 2016-01-04 LAB — CBC
HCT: 29.4 % — ABNORMAL LOW (ref 36.0–46.0)
HEMOGLOBIN: 8.8 g/dL — AB (ref 12.0–15.0)
MCH: 23.5 pg — AB (ref 26.0–34.0)
MCHC: 29.9 g/dL — ABNORMAL LOW (ref 30.0–36.0)
MCV: 78.4 fL (ref 78.0–100.0)
PLATELETS: 562 10*3/uL — AB (ref 150–400)
RBC: 3.75 MIL/uL — AB (ref 3.87–5.11)
RDW: 25.3 % — ABNORMAL HIGH (ref 11.5–15.5)
WBC: 5 10*3/uL (ref 4.0–10.5)

## 2016-01-04 LAB — OCCULT BLOOD X 1 CARD TO LAB, STOOL: Fecal Occult Bld: POSITIVE — AB

## 2016-01-04 LAB — URINALYSIS, ROUTINE W REFLEX MICROSCOPIC
BILIRUBIN URINE: NEGATIVE
Glucose, UA: NEGATIVE mg/dL
HGB URINE DIPSTICK: NEGATIVE
KETONES UR: NEGATIVE mg/dL
Leukocytes, UA: NEGATIVE
Nitrite: NEGATIVE
PH: 5.5 (ref 5.0–8.0)
Protein, ur: NEGATIVE mg/dL
SPECIFIC GRAVITY, URINE: 1.015 (ref 1.005–1.030)

## 2016-01-04 LAB — COMPREHENSIVE METABOLIC PANEL
ALBUMIN: 3.2 g/dL — AB (ref 3.5–5.0)
ALK PHOS: 58 U/L (ref 38–126)
ALT: 13 U/L — AB (ref 14–54)
AST: 19 U/L (ref 15–41)
Anion gap: 9 (ref 5–15)
BUN: 16 mg/dL (ref 6–20)
CALCIUM: 9.3 mg/dL (ref 8.9–10.3)
CHLORIDE: 102 mmol/L (ref 101–111)
CO2: 25 mmol/L (ref 22–32)
CREATININE: 0.63 mg/dL (ref 0.44–1.00)
GFR calc non Af Amer: >60 mL/min (ref >60)
Glucose, Bld: 158 mg/dL — ABNORMAL HIGH (ref 65–99)
Potassium: 4.5 mmol/L (ref 3.5–5.1)
SODIUM: 136 mmol/L (ref 135–145)
Total Bilirubin: 0.6 mg/dL (ref 0.3–1.2)
Total Protein: 8.3 g/dL — ABNORMAL HIGH (ref 6.5–8.1)

## 2016-01-04 LAB — PROTIME-INR
INR: 1.18 (ref 0.00–1.49)
Prothrombin Time: 15.2 seconds (ref 11.6–15.2)

## 2016-01-04 LAB — APTT: APTT: 36 s (ref 24–37)

## 2016-01-04 LAB — GLUCOSE, CAPILLARY
GLUCOSE-CAPILLARY: 103 mg/dL — AB (ref 65–99)
GLUCOSE-CAPILLARY: 164 mg/dL — AB (ref 65–99)
Glucose-Capillary: 149 mg/dL — ABNORMAL HIGH (ref 65–99)
Glucose-Capillary: 101 mg/dL — ABNORMAL HIGH (ref 65–99)

## 2016-01-04 MED ORDER — DEXTROSE 5 % IV SOLN: 1.5000 g | INTRAVENOUS | Status: DC

## 2016-01-04 NOTE — Progress Notes (Signed)
Occupational Therapy Discharge Summary  Patient Details  Name: Jessica Abbott MRN: [id number] Date of Birth: [birth year]  Today's Date: 01/04/2016 OT Individual Time: 0800-0900 OT Individual Time Calculation (min): 60 min   GRAD DAY! Self care retraining at shower level with focus on navigating around room via w/c mod I and then accessing the bathroom with supervision ambulating with  RW (to get to shower and toilet).  Pt able to perform all sit to stand mod I with extra time and proper hand placement. Pt able to perform stand pivot transfers with bilateral UE support with extra time without cues- mod I level. Pt donned clean limb shrinker with extra time. Recommend donning it underneath brief for ease with toileting.  Pt donned clothing sitting on bed and plans to dress on her bed at home.  Did not have time to fit pt for TENS today during this session- discussed with PT partner about possibly trailing  this method. Pt performed all grooming tasks mod I at the sink.    Patient has met 9 of 9 long term goals due to improved activity tolerance, improved balance, postural control, ability to compensate for deficits and functional use of  RIGHT upper, LEFT upper and LEFT lower extremity.  Pt still presents with significant right LE spasms and hip flexion contracture. Patient to discharge at overall supervision to mod I  level.  Patient's care partner is independent to provide the necessary physical assistance at discharge.    Reasons goals not met: n/a  Recommendation:  Patient will benefit from ongoing skilled OT services in home health setting to continue to advance functional skills in the area of BADL and Reduce care partner burden.  Equipment: BSC  Tub bench was recommended but pt reported she will get her own since insurance will not pay for it W/c with cushion  Reasons for discharge: treatment goals met and discharge from hospital  Patient/family agrees with progress made and goals  achieved: Yes  OT Discharge Precautions/Restrictions  Precautions Precaution Comments: R AKA Restrictions Weight Bearing Restrictions: No General   Vital Signs Therapy Vitals Temp: 98.6 F (37 C) Temp Source: Oral Pulse Rate: 67 Resp: 18 BP: (!) 115/58 mmHg Patient Position (if appropriate): Lying Oxygen Therapy SpO2: 100 % O2 Device: Not Delivered Pain Pain Assessment Pain Assessment: No/denies pain ADL ADL ADL Comments: see functional navigator Vision/Perception  Vision- History Baseline Vision/History: Wears glasses Wears Glasses: Reading only Patient Visual Report: No change from baseline Vision- Assessment Vision Assessment?: No apparent visual deficits  Cognition Overall Cognitive Status: Within Functional Limits for tasks assessed Arousal/Alertness: Awake/alert Orientation Level: Oriented X4 Attention: Alternating Memory: Appears intact Awareness: Appears intact Problem Solving: Appears intact Safety/Judgment: Appears intact Sensation Sensation Light Touch: Appears Intact (hypersensitive in right residual limb) Stereognosis: Appears Intact Hot/Cold: Appears Intact Proprioception: Appears Intact Coordination Gross Motor Movements are Fluid and Coordinated: Yes Fine Motor Movements are Fluid and Coordinated: Yes Motor  Motor Motor: Within Functional Limits Motor - Discharge Observations: improved overall strength Mobility  Bed Mobility Rolling Right: 6: Modified independent (Device/Increase time) Rolling Left: 6: Modified independent (Device/Increase time) Supine to Sit: 6: Modified independent (Device/Increase time) Sit to Supine: 6: Modified independent (Device/Increase time) Transfers Sit to Stand: 6: Modified independent (Device/Increase time) Stand to Sit: 6: Modified independent (Device/Increase time)  Trunk/Postural Assessment  Cervical Assessment Cervical Assessment: Within Functional Limits Thoracic Assessment Thoracic Assessment:  Within Functional Limits Lumbar Assessment Lumbar Assessment: Within Functional Limits Postural Control Postural Limitations: improved  Balance Static Sitting  Balance Static Sitting - Level of Assistance: 6: Modified independent (Device/Increase time) Dynamic Sitting Balance Dynamic Sitting - Level of Assistance: 6: Modified independent (Device/Increase time) Static Standing Balance Static Standing - Level of Assistance: 6: Modified independent (Device/Increase time) Extremity/Trunk Assessment RUE Assessment RUE Assessment: Within Functional Limits LUE Assessment LUE Assessment: Within Functional Limits   See Function Navigator for Current Functional Status.  Jessica Abbott [facility] 01/04/2016, 8:28 AM

## 2016-01-04 NOTE — Progress Notes (Signed)
Patient A/O, no noted distress. Notified Posey Pronto, MD [phone]) of patient positive hemoccult stool and hgb results; MD notes he will continue to observe and for nursing continue to assess and observe patient as well. Patient has participated in therapy. Tolerated meds. Has been in wheelchair today.

## 2016-01-04 NOTE — Progress Notes (Signed)
Social Work Patient ID: Jessica Abbott, female   DOB: 10-19-1940, 75 y.o.   MRN: [id number] Wants to see if can find a tub bench on her own, so have cancelled this. Feels ready to go home tomorrow.

## 2016-01-04 NOTE — Discharge Instructions (Signed)
Inpatient Rehab Discharge Instructions  Aprilmarie Hardman Discharge date and time:    Activities/Precautions/ Functional Status: Activity: no lifting, driving, or strenuous exercise for till cleared by MD Diet: diabetic diet Wound Care:  Contact Dr. Sharol Given if you notice any redness, swelling, purulent drainage or develop fever and chills ,  Functional status:  ___ No restrictions     ___ Walk up steps independently ___ 24/7 supervision/assistance   ___ Walk up steps with assistance ___ Intermittent supervision/assistance  ___ Bathe/dress independently ___ Walk with walker     ___ Bathe/dress with assistance ___ Walk Independently    ___ Shower independently ___ Walk with assistance    ___ Shower with assistance _X__ No alcohol     ___ Return to work/school ________  Special Instructions:    COMMUNITY REFERRALS UPON DISCHARGE:    Home Health:   PT, OT, RN   [place]   Date of last service:01/05/2016  Medical Equipment/Items [place]   [phone]   GENERAL COMMUNITY RESOURCES FOR PATIENT/FAMILY: Support Groups:[facility] SUPPORT GROUP SECOND Thursday OF EACH MONTH FROM 7:00-8:30 PM @ [facility]   My questions have been answered and I understand these instructions. I will adhere to these goals and the provided educational materials after my discharge from the hospital.  Patient/Caregiver Signature _______________________________ Date __________  Clinician Signature _______________________________________ Date __________  Please bring this form and your medication list with you to all your follow-up doctor's appointments.

## 2016-01-04 NOTE — Discharge Summary (Signed)
Physician Discharge Summary  Patient ID: Jessica Abbott MRN: [id number] DOB/AGE: Jun 17, 1941 75 y.o.  Admit date: 12/26/2015 Discharge date: 01/05/2016  Discharge Diagnoses:  Principal Problem:   Unilateral AKA ([place]) Active Problems:   S/P AKA (above knee amputation) unilateral (HCC)   Abnormality of gait   Weakness   Benign essential HTN   Anemia of chronic disease   Type 2 diabetes mellitus with complication, without long-term current use of insulin (HCC)   Thrombocytosis (HCC)   E. coli UTI   Phantom limb pain (HCC)   Labile blood pressure   Discharged Condition: stable.   Significant Diagnostic Studies: No results found.  Labs:  Basic Metabolic Panel:  Recent Labs Lab 01/02/16 0427 01/03/16 1259 01/04/16 0826  NA  --  135 136  K  --  4.4 4.5  CL  --  104 102  CO2  --  23 25  GLUCOSE  --  118* 158*  BUN  --  17 16  CREATININE 0.57 0.64 0.63  CALCIUM  --  9.2 9.3    CBC:  Recent Labs Lab 01/03/16 0550 01/04/16 0826  WBC 6.2 5.0  NEUTROABS 3.4  --   HGB 8.8* 8.8*  HCT 28.6* 29.4*  MCV 75.3* 78.4  PLT 462* 562*    CBG:  Recent Labs Lab 01/04/16 0703 01/04/16 1158 01/04/16 1647 01/04/16 2104 01/05/16 0655  GLUCAP 103* 164* 149* 101* 103*     Brief HPI:   Jessica Abbott is a 75 y.o. female with history of HTN, DMT2, CKD, pulmonary HTN, chronic right knee contracture, PVD s/p left femoral to dorsalis pedis bypass who developed right foot wound and superficial occlusion but declined AKA and anemia due to subacute GIB 11/2015 (negative EGD/colonoscopy). Mobility limited in past 1-2 months to few steps with walker and wheelchair due to RLE pain and ischemia. 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 Scot Dock. Hospital course complicated by acute on chronic anemia noted with heme positive stool, pain management. Denies associated phantom limb pain. Therapy evaluations done and CIR recommended to help  patient regain prior level of independence.     Hospital Course: Jessica Abbott was admitted to rehab 12/26/2015 for inpatient therapies to consist of PT and OT at least three hours five days a week. Past admission physiatrist, therapy team and rehab RN have worked together to provide customized collaborative inpatient rehab. She was started on Neurontin to help manage neuropathy and has been educated on desensitization techniques.  Pain level has improved greatly and neurontin was d/c per patient request.  Baclofen has been adjusted to qid to help manage spasms.  Blood pressures were monitored on bid basis and have been stable on Lisinopril and norvasc daily.  H/H has been monitored with serial checks and has been stable at 8.8/29.4. She did have one heme positive stools but no acute signs of bleeding noted.  Iron supplement was increased to bid and she is to continue this after discharge. Po intake has been good and blood sugars have been reasonably controlled with occasional high in 160s.  R-AKA incision is healing well without signs or symptoms of infection.  She has made good progress and currently requires supervision at wheelchair level.  She will continue to receive follow up HHPT.HHOT and HHRN by [facility] after discharge.    Rehab course: During patient's stay in rehab weekly team conferences were held to monitor patient's progress, set goals and discuss barriers  to discharge. At admission, patient required moderate assistance with basic self-care needs and min assist with mobility. She has had improvement in activity tolerance, balance, postural control, as well as ability to compensate for deficits. She is has had improvement in functional use BUE and LLE as well as improved awareness She requires set up assist for ADL but is independent for bathing and requires supervision with lower body dressing.  She is able to ambulate 52' in controlled environment with supervision and cues for  management of AD.  She requires min assist to climb 4 stairs and is able to navigate wheelchair 150' independently. Family education was done with daughter and sister regarding all aspects of care and assistance needed.     Disposition:  Home   Diet: Diabetic diet.   Special Instructions: 1. Wash incision with soap and water. Keep clean and dry. Wear shrinker daily.  2. Check blood sugars 1-2 times a day before meals.        Medication List    STOP taking these medications        HYDROcodone-acetaminophen 5-325 MG tablet  Commonly known as:  NORCO/VICODIN      TAKE these medications        acetaminophen 500 MG tablet  Commonly known as:  TYLENOL  Take 500 mg by mouth every 6 (six) hours as needed (pain).     albuterol 108 (90 Base) MCG/ACT inhaler  Commonly known as:  PROVENTIL HFA;VENTOLIN HFA  Inhale 2 puffs into the lungs every 6 (six) hours as needed for wheezing or shortness of breath.     amLODipine 5 MG tablet  Commonly known as:  NORVASC  Take 5 mg by mouth daily.     aspirin EC 81 MG tablet  Take 81 mg by mouth daily.     baclofen 10 MG tablet  Commonly known as:  LIORESAL  Take 0.5 tablets (5 mg total) by mouth 4 (four) times daily.     docusate sodium 100 MG capsule  Commonly known as:  COLACE  Take 100 mg by mouth daily as needed (constipation).     ferrous sulfate 325 (65 FE) MG tablet  Take 1 tablet (325 mg total) by mouth 2 (two) times daily with a meal.     lactose free nutrition Liqd  Take 237 mLs by mouth daily as needed (occasionally).     lisinopril 10 MG tablet  Commonly known as:  PRINIVIL,ZESTRIL  Take 10 mg by mouth daily.     metFORMIN 500 MG tablet  Commonly known as:  GLUCOPHAGE  Take 500 mg by mouth daily with breakfast.     oxyCODONE 5 MG immediate release tablet  Commonly known as:  Oxy IR/ROXICODONE  Take 1 tablet (5 mg total) by mouth 2 (two) times daily as needed for severe pain.     pantoprazole 40 MG tablet  Commonly  known as:  PROTONIX  Take 1 tablet (40 mg total) by mouth 2 (two) times daily.     Vitamin D3 2000 units capsule  Take 2,000 Units by mouth daily.       Follow-up Information    Follow up with [name], MD.   Specialty:  Physical Medicine and Rehabilitation   Why:  office will call you with follow up appointment   Contact information:   [address] [place] [address] [phone]       Follow up with Newt Minion, MD. Call today.   Specialty:  Orthopedic Surgery   Why:  for post op appointment   Contact information:   [place] [address] [phone]       Follow up with Nicola Girt, DO On 01/16/2016.   Specialty:  Internal Medicine   Why:  APPT 4:00 PM   Contact information:   [id number] [place] [address] [phone]       Signed: Bary Leriche 01/05/2016, 10:38 AM

## 2016-01-04 NOTE — Progress Notes (Signed)
Physical Therapy Session Note  Patient Details  Name: Jessica Abbott MRN: [id number] Date of Birth: [birth year]  Today's Date: 01/04/2016 PT Individual Time: 1302-1400 And 6761-9509 PT Individual Time Calculation (min): 58 min AND 30 min   Short Term Goals: Week 1:  PT Short Term Goal 1 (Week 1): STG = LTG due to short ELOS  Skilled Therapeutic Interventions/Progress Updates:    Patient received sitting in WC. PT instructed patient in [facility] mobility for 152f, 159f 20060fand 175f30fthout cues from PT, but noted to take increased time and effort. PT instructed Gait for 50ft52fcontrolled environmnet with supervision A with cues for AD management. Gait training in mock home environment for 50ft 36farpet and tile floor with supervision A. Stair training for 4 inch step x 4 with BUE support on Rail and Min A. Stair training on 6 inch step x 1 with mod A from PT and constant cues for UE placement. PT instructed patient in WC mobYrekaity on handicap ramp to parking dec with cue to improve forward lean. Following first instruction, patient performed WC mobility with with out cues or assistance form PT to ascend or descend ramp for 150ft. 61fobility on uneven surface for 150ft wi59ft cues or instruction from PT.  Patient returned to room in WC and [facility] with all needs met and call bell within reach.    Session 2:  WC mobility in hall for 150ft wit62f assistance or cueing. Patient instructed in Sitting balance in WC playing Wii game, Patient able to sit up to 12 minutes without back support and was noted to not require greater than supervision A for all sitting balance. Patient performed WC mobility back to room for 150ft. PT 69f patient in WC with [facility] and call bell within reach.    Therapy Documentation Precautions:  Precautions Precautions: Fall Precaution Comments: R AKA Restrictions Weight Bearing Restrictions: Yes RLE Weight Bearing: Non weight bearing General:   Vital  Signs: Therapy Vitals Temp: 97.8 F (36.6 C) Temp Source: Oral Pulse Rate: 95 Resp: 18 BP: (!) 121/54 mmHg Patient Position (if appropriate): Sitting Oxygen Therapy SpO2: 100 % O2 Device: Not Delivered  See Function Navigator for Current Functional Status.   Therapy/Group: Individual Therapy  Shawanda Sievert E TLorie Phenix, 4:56 PM

## 2016-01-04 NOTE — Progress Notes (Signed)
Physical Therapy Discharge Summary  Patient Details  Name: Jessica Abbott MRN: [id number] Date of Birth: May 29, 1941  Today's Date: 01/04/2016 PT Individual Time: 1001-1102 PT Individual Time Calculation (min): 61 min    Patient has met 9 of 10 long term goals due to improved activity tolerance, improved balance, improved postural control, increased strength, decreased pain and ability to compensate for deficits.  Patient to discharge at a wheelchair level Modified Independent.   Patient's care partner is independent to provide the necessary physical assistance at discharge.  Reasons goals not met: Pt did not meet stair negotiation goal without railings due to pt's decreased balance & high anxiety with task.  Recommendation:  Patient will benefit from ongoing skilled PT services in home health setting to continue to advance safe functional mobility, address ongoing impairments in RLE ROM, BLE strength, decreased endurance with ambulatory & standing tasks, and minimize fall risk.  Equipment: wheelchair [phone])  Reasons for discharge: treatment goals met  Patient/family agrees with progress made and goals achieved: Yes  Skilled PT treatment:  Pt received in w/c & agreeable to PT, denying c/o pain. Pt able to negotiate >300 ft in w/c with BUE to propell device; pt able to negotiate ramp with supervision fade to Mod I. P able to demonstrate car transfers with RW, and squat pivot to/from w/c with supervision. Pt able to ambulate over uneven surfaces & in home environment with RW & supervision. Pt able to perform all bed mobility tasks with Mod I in regular bed. PT & clinical specialist discussed use of TENS unit to help reduce spasms in RLE but educated pt that it would only offer relief for time period that it was on residual limb. PT discussed d/c plans home & pt reports she & family feel comfortable bumping pt up step in w/c; pt reports she does not intend to try to hop up step with RW & even  hopes to have ramp installed in future. Pt voiced no concerns regarding d/c home. Pt able to safely d/c home at mod I w/c level & supervision for ambulating short distances. Pt would benefit from continued PT to help address R residual limb ROM. At end of session pt left in w/c with all needs within reach.   PT Discharge Precautions/Restrictions Precautions Precautions: Fall Precaution Comments: R AKA Restrictions Weight Bearing Restrictions: Yes RLE Weight Bearing: Non weight bearing Pain Pain Assessment Pain Assessment: No/denies pain Pain Score: 0-No pain  Cognition Overall Cognitive Status: Within Functional Limits for tasks assessed Safety/Judgment: Appears intact Sensation Sensation Light Touch: Appears Intact (BLE equal & intact to light tough) Motor  Motor Motor - Skilled Clinical Observations: pt c/o spasms in R residual limb  Mobility Bed Mobility Bed Mobility: Rolling Right;Rolling Left;Supine to Sit;Sit to Supine Rolling Right: 6: Modified independent (Device/Increase time) Rolling Left: 6: Modified independent (Device/Increase time) Supine to Sit: 6: Modified independent (Device/Increase time) Sit to Supine: 6: Modified independent (Device/Increase time) Transfers Transfers: Yes Sit to Stand: 5: Supervision Stand to Sit: 5: Supervision Stand Pivot Transfers: 5: Supervision Squat Pivot Transfers: 5: Supervision Locomotion  Ambulation Ambulation: Yes Ambulation/Gait Assistance: 5: Supervision Ambulation Distance (Feet): 70 Feet (max distance during entire POC) Assistive device: Rolling walker Gait Gait:  (hopping with LLE) Stairs / Additional Locomotion Stairs: Yes Stairs Assistance: 6: Modified independent (Device/Increase time) Stair Management Technique: No rails;With walker Number of Stairs: 1 Height of Stairs: 6 (inches) Ramp: 6: Modified independent (Device) (in w/c) Product manager Mobility: Yes Wheelchair Assistance: 6: Modified  independent (Device/Increase time) Environmental health practitioner: Both upper extremities Wheelchair Parts Management: Independent Distance: >300 ft   Balance Balance Balance Assessed: Yes Static Sitting Balance Static Sitting - Balance Support: No upper extremity supported Static Sitting - Level of Assistance: 6: Modified independent (Device/Increase time) Static Standing Balance Static Standing - Balance Support: No upper extremity supported (able to maintain standing balance with BUE on RW & supervision) Static Standing - Level of Assistance: 3: Mod assist Extremity Assessment      RLE Assessment RLE Assessment:  (pt with spasms in R residual limb, pt in ~45 degrees hip flexion & unable to achieve neutral hip ROM) LLE Assessment LLE Assessment: Within Functional Limits LLE Strength Left Hip Flexion: 3+/5 Left Knee Flexion: 4/5 Left Knee Extension: 4/5 Left Ankle Dorsiflexion: 4/5   See Function Navigator for Current Functional Status.  Waunita Schooner 01/04/2016, 12:42 PM

## 2016-01-04 NOTE — Patient Care Conference (Signed)
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 Labile blood pressure   . E. coli UTI   . Phantom limb pain ([place])   . 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 Abnormality of gait   . Weakness   . Post-operative pain   . Benign essential HTN   . Anemia of chronic disease   . Type 2 diabetes mellitus with complication, without long-term current use of insulin ([place])   . Thrombocytosis ([place])   . 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 Bleeding gastrointestinal   . Controlled diabetes mellitus type 2 with complications ([place])   . 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 PVD (peripheral vascular disease) ([place])   . Acute blood loss anemia   . Gangrene of foot ([place])   . Gastrointestinal hemorrhage with melena   . Lactic acidosis   . Acute renal failure ([place])   . Acute GI bleeding 11/22/2015  . Upper GI bleed 11/22/2015    Expected Discharge Date: Expected Discharge Date: 01/05/16  Team Members Present: Physician leading conference: Dr. Delice Lesch Social Worker Present: Ovidio Kin, LCSW Nurse Present: Elliot Cousin, RN PT Present: Other (comment) Jordan Hawks MIller & Rory Percy) OT Present: Willeen Cass, OT SLP Present: Windell Moulding, SLP PPS Coordinator present : Daiva Nakayama, RN, CRRN     Current Status/Progress Goal  Weekly Team Focus  Medical   Weakness, abnormality of gait, pain secondary to R-AKA  improve safety, transferes, mobility, treat UTI  see above   Bowel/Bladder   Continent Bowel/Bladder. LBM 01/03/2016  continent with timeed toileting  Assess bowel/Bladder function and toilet patient Q2hrs and as needed    Swallow/Nutrition/ Hydration             ADL's             Mobility   supervision for stand/squat pivot transfers, mod/max A for negotiating single step with RW, ambulates ~70 ft with RW & supervision, mod I for w/c  supervision standing balance, Mod I bed mobility, supervision functional transfers, mod I w/c   family education, single step negotiation with RW, ambulation with RW, sitting balance   Communication             Safety/Cognition/ Behavioral Observations            Pain   Denied any pain or discomfort  <3 on the scale 0-10  Assess and treat pain q shift and as needed   Skin   Sasrum wound almost reslove, using barrier cream  Skin to remain free from infection/breakdown  Assess skin q shift and document findings, turn and reposition Q2hrs.      *See Care Plan and progress notes for long and short-term goals.  Barriers to Discharge: UTI, ABLA, boggy heal    Possible Resolutions  to Barriers:  Complete UTI tx, follow labs, monitor left heel    Discharge Planning/Teaching Needs:  Family education completed and daughter's to provide 24 hr care. Pt feels ready for DC tomorrow.      Team Discussion:  Met goals of supervision with ambulation and transfers. Pain managed and wound on sacrum almost healed. Family education completed yesterday. Ready for DC tomorrow.  Revisions to Treatment Plan:  None   Continued Need for Acute Rehabilitation Level of Care: The patient requires daily medical management by a physician with specialized training in physical medicine and rehabilitation for the following conditions: Daily direction of a multidisciplinary physical  rehabilitation program to ensure safe treatment while eliciting the highest outcome that is of practical value to the patient.: Yes Daily medical management of patient stability for increased activity during participation in an intensive rehabilitation regime.: Yes Daily analysis of laboratory values and/or radiology reports with any subsequent need for medication adjustment of medical intervention for : Post surgical problems;Diabetes problems;Wound care problems;Urological problems  Jessica Abbott 01/05/2016, 8:39 AM

## 2016-01-04 NOTE — Progress Notes (Signed)
[facility] PHYSICAL MEDICINE & REHABILITATION     PROGRESS NOTE  Subjective/Complaints:  Patient seen lying in bed this morning. She denies pain. Her prolonged relief for left foot is not on, reminded of importance.  ROS: Denies phantom limb pain, incisional pain, CP, SOB, N/V/D  Objective: Vital Signs: Blood pressure 115/58, pulse 67, temperature 98.6 F (37 C), temperature source Oral, resp. rate 18, height 5\' 2"  (1.575 m), weight 55.611 kg (122 lb 9.6 oz), SpO2 100 %. No results found.  Recent Labs  01/03/16 0550  WBC 6.2  HGB 8.8*  HCT 28.6*  PLT 462*    Recent Labs  01/02/16 0427 01/03/16 1259  NA  --  135  K  --  4.4  CL  --  104  GLUCOSE  --  118*  BUN  --  17  CREATININE 0.57 0.64  CALCIUM  --  9.2   CBG (last 3)   Recent Labs  01/03/16 1620 01/03/16 2214 01/04/16 0703  GLUCAP 142* 97 103*    Wt Readings from Last 3 Encounters:  01/04/16 55.611 kg (122 lb 9.6 oz)  12/09/15 62.143 kg (137 lb)  11/28/15 67.132 kg (148 lb)    Physical Exam:  BP 115/58 mmHg  Pulse 67  Temp(Src) 98.6 F (37 C) (Oral)  Resp 18  Ht 5\' 2"  (1.575 m)  Wt 55.611 kg (122 lb 9.6 oz)  BMI 22.42 kg/m2  SpO2 100% Constitutional: She appears well-developed and well-nourished. NAD. HENT: Normocephalic and atraumatic.  Eyes: Conjunctivae are normal.   Cardiovascular: Normal rate and regular rhythm.No murmur heard. Respiratory: Effort normal and breath sounds normal. No stridor. No respiratory distress. She has no wheezes.  GI: Soft. Bowel sounds are normal. She exhibits no distension. There is no tenderness.  Musculoskeletal: She exhibits mild edema and Mild tenderness.  LLE with well healed old surgical incisions.  R-AKA well shaped Neurological: She is alert and oriented.  Motor: B/l UE 5/5 LLE hip flexion 5/5, knee extension 5/5, ankle dorsi/plantar flexion 5/5 Right hip flexion 5/5   No resting tone Skin: Left heel boggy. AKA with staples C/D/I.  Psychiatric:  Her speech is normal. Her affect is blunt. Cognition and memory are normal.    Assessment/Plan: 1. 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 Physiatrist is providing close team supervision and 24 hour management of active medical problems listed below. Physiatrist and rehab team continue to assess barriers to discharge/monitor patient progress toward functional and medical goals.  Function:  Bathing Bathing position   Position: Shower  Bathing parts Body parts bathed by patient: Right arm, Left arm, Chest, Abdomen, Front perineal area, Buttocks, Right upper leg, Left upper leg, Left lower leg Body parts bathed by helper: Right lower leg, Left lower leg, Back  Bathing assist Assist Level: No help, No cues   Set up : To adjust water temperature  Upper Body Dressing/Undressing Upper body dressing   What is the patient wearing?: Bra, Pull over shirt/dress Bra - Perfomed by patient: Thread/unthread right bra strap, Thread/unthread left bra strap, Hook/unhook bra (pull down sports bra)   Pull over shirt/dress - Perfomed by patient: Thread/unthread right sleeve, Thread/unthread left sleeve, Put head through opening, Pull shirt over trunk          Upper body assist Assist Level: No help, No cues   Set up : To obtain clothing/put away  Lower Body Dressing/Undressing Lower body dressing  What is the patient wearing?: Underwear, Pants, Non-skid slipper socks Underwear - Performed by patient: Thread/unthread right underwear leg, Thread/unthread left underwear leg, Pull underwear up/down Underwear - Performed by helper: Thread/unthread right underwear leg, Thread/unthread left underwear leg Pants- Performed by patient: Thread/unthread right pants leg, Thread/unthread left pants leg, Pull pants up/down, Fasten/unfasten pants   Non-skid slipper socks- Performed by patient: Don/doff left sock                     Lower body assist Assist for lower body dressing: Set up   Set up :  (To prepare breif)  Toileting Toileting Toileting activity did not occur: No continent bowel/bladder event Toileting steps completed by patient: Adjust clothing prior to toileting, Performs perineal hygiene, Adjust clothing after toileting Toileting steps completed by helper: Adjust clothing prior to toileting    Toileting assist Assist level: Supervision or verbal cues   Transfers Chair/bed transfer   Chair/bed transfer method: Squat pivot Chair/bed transfer assist level: Supervision or verbal cues Chair/bed transfer assistive device: Environmental consultant, Air cabin crew     Max distance: 70 Assist level: Supervision or verbal cues   Wheelchair   Type: Manual Max wheelchair distance: 150 ft Assist Level: No help, No cues, assistive device, takes more than reasonable amount of time  Cognition Comprehension Comprehension assist level: Understands basic 90% of the time/cues < 10% of the time  Expression Expression assist level: Expresses basic 90% of the time/requires cueing < 10% of the time.  Social Interaction Social Interaction assist level: Interacts appropriately 90% of the time - Needs monitoring or encouragement for participation or interaction.  Problem Solving Problem solving assist level: Solves basic 90% of the time/requires cueing < 10% of the time  Memory Memory assist level: More than reasonable amount of time    Medical Problem List and Plan: 1. 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 DVT Prophylaxis/Anticoagulation: Pharmaceutical: Lovenox 3. Pain Management: Continue oxycodone prn.  Patient offered gabapentin 100 daily at bedtime, however she states she does not needed, stopped. 4. Mood: Team to provide ego support. LCSW to follow for evaluation and support.  5. Neuropsych: This patient is capable of making decisions on her  own behalf. 6. Skin/Wound Care: Monitor wound for healing daily.   Continue Prevalon boot to left heel 7. Fluids/Electrolytes/Nutrition: Monitor I/O. Continue boost for supplements and to promote wound healing.   BMP within acceptable range on 4/18 8. HTN: Monitor BP.   Continue Norvasc and lisinopril   Labile at times 9. Recent subacute GIB: Had negative work up. Monitor H/H.   Monitor for recurrent melena/GIB.   Continue Protonix BID.  10. Acute on chronic anemia: Increase iron to bid.   Hemoglobin 8.8 on 4/18 (improved)  11. Heme positive stool: Monitor for melena or hematochezia.  13. DMT2: Monitor BS with ac/hs checks. Continue metformin 500 mg bid.   Well controlled 14. Thrombocytosis: Likely reactive. Continue to monitor.   Platelets 462 on 4/18 (improved) 15. UTI   Escherichia coli, sensitive to macrobid--- to be completed today     LOS (Days) 9 A FACE TO FACE EVALUATION WAS PERFORMED  Deriona Altemose Lorie Phenix 01/04/2016 7:41 AM

## 2016-01-04 NOTE — Plan of Care (Signed)
Problem: RH Ambulation Goal: LTG Patient will ambulate in controlled environment (PT) LTG: Patient will ambulate in a controlled environment, # of feet with assistance (PT).  Outcome: Completed/Met Date Met:  01/04/16 70 ft RW Goal: LTG Patient will ambulate in home environment (PT) LTG: Patient will ambulate in home environment, # of feet with assistance (PT).  Outcome: Completed/Met Date Met:  01/04/16 55 ft with RW  Problem: RH Wheelchair Mobility Goal: LTG Patient will propel w/c in community environment (PT) LTG: Patient will propel wheelchair in community environment, # of feet with assist (PT)  Outcome: Completed/Met Date Met:  01/04/16 300 ft  Problem: RH Stairs Goal: LTG Patient will ambulate up and down stairs w/assist (PT) LTG: Patient will ambulate up and down # of stairs with assistance (PT)  Outcome: Not Met (add Reason) 1 step without rails & RW & Mod/max A; not met 2/2 pt's fear with activity & decreased balance  Problem: RH Wheelchair Mobility Goal: LTG Patient will propel w/c in home environment (PT) LTG: Patient will propel wheelchair in home environment, # of feet with assistance (PT).  Outcome: Completed/Met Date Met:  01/04/16 50 ft

## 2016-01-05 LAB — TYPE AND SCREEN
ABO/RH(D): A POS
Antibody Screen: POSITIVE
DAT, IgG: NEGATIVE
UNIT DIVISION: 0
Unit division: 0

## 2016-01-05 LAB — GLUCOSE, CAPILLARY: GLUCOSE-CAPILLARY: 103 mg/dL — AB (ref 65–99)

## 2016-01-05 MED ORDER — FERROUS SULFATE 325 (65 FE) MG PO TABS: 325.0000 mg | ORAL_TABLET | Freq: Two times a day (BID) | ORAL | Status: AC

## 2016-01-05 MED ORDER — OXYCODONE HCL 5 MG PO TABS: 5.0000 mg | ORAL_TABLET | Freq: Two times a day (BID) | ORAL | Status: DC | PRN

## 2016-01-05 MED ORDER — BACLOFEN 10 MG PO TABS: 5.0000 mg | ORAL_TABLET | Freq: Four times a day (QID) | ORAL | Status: DC

## 2016-01-05 NOTE — Plan of Care (Signed)
Problem: RH PAIN MANAGEMENT Goal: RH STG PAIN MANAGED AT OR BELOW PT'S PAIN GOAL <4  Outcome: Completed/Met Date Met:  01/05/16 No c/o pain

## 2016-01-05 NOTE — Progress Notes (Signed)
Pt discharged home with family. Discharge instructions provided by Algis Liming, PA. All questions answered, pt verbalized understanding. Pt escorted off unit in w/c with personal belonging by [facility],  NT/Secretary.

## 2016-01-05 NOTE — Progress Notes (Addendum)
[place] PHYSICAL MEDICINE & REHABILITATION     PROGRESS NOTE  Subjective/Complaints:  Patient sitting up in bed this morning eating breakfast. She is in a good mood and states that she is ready to go home.  ROS: Denies phantom limb pain, incisional pain, CP, SOB, N/V/D  Objective: Vital Signs: Blood pressure 122/82, pulse 81, temperature 98.6 F (37 C), temperature source Oral, resp. rate 18, height 5\' 2"  (1.575 m), weight 55.611 kg (122 lb 9.6 oz), SpO2 100 %. No results found.  Recent Labs  01/03/16 0550 01/04/16 0826  WBC 6.2 5.0  HGB 8.8* 8.8*  HCT 28.6* 29.4*  PLT 462* 562*    Recent Labs  01/03/16 1259 01/04/16 0826  NA 135 136  K 4.4 4.5  CL 104 102  GLUCOSE 118* 158*  BUN 17 16  CREATININE 0.64 0.63  CALCIUM 9.2 9.3   CBG (last 3)   Recent Labs  01/04/16 1647 01/04/16 2104 01/05/16 0655  GLUCAP 149* 101* 103*    Wt Readings from Last 3 Encounters:  01/04/16 55.611 kg (122 lb 9.6 oz)  12/09/15 62.143 kg (137 lb)  11/28/15 67.132 kg (148 lb)    Physical Exam:  BP 122/82 mmHg  Pulse 81  Temp(Src) 98.6 F (37 C) (Oral)  Resp 18  Ht 5\' 2"  (1.575 m)  Wt 55.611 kg (122 lb 9.6 oz)  BMI 22.42 kg/m2  SpO2 100% Constitutional: She appears well-developed and well-nourished. NAD. HENT: Normocephalic and atraumatic.  Eyes: Conjunctivae are normal.   Cardiovascular: Normal rate and regular rhythm.No murmur heard. Respiratory: Effort normal and breath sounds normal. No stridor. No respiratory distress. She has no wheezes.  GI: Soft. Bowel sounds are normal. She exhibits no distension. There is no tenderness.  Musculoskeletal: She exhibits Minimal edema and minimal tenderness.  LLE with well healed old surgical incisions.  R-AKA well shaped Right hip flexion contracture  Neurological: She is alert and oriented.  Motor: B/l UE 5/5 LLE hip flexion 5/5, knee extension 5/5, ankle dorsi/plantar flexion 5/5 Right hip flexion 5/5   No resting  tone Skin: Left heel boggy. AKA with staples C/D/I.  Psychiatric: Her speech is normal. Her affect is blunt. Cognition and memory are normal.    Assessment/Plan: 1. 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 Physiatrist is providing close team supervision and 24 hour management of active medical problems listed below. Physiatrist and rehab team continue to assess barriers to discharge/monitor patient progress toward functional and medical goals.  Function:  Bathing Bathing position   Position: Shower  Bathing parts Body parts bathed by patient: Right arm, Left arm, Chest, Abdomen, Front perineal area, Buttocks, Right upper leg, Left upper leg, Left lower leg Body parts bathed by helper: Right lower leg, Left lower leg, Back  Bathing assist Assist Level: No help, No cues   Set up : To adjust water temperature  Upper Body Dressing/Undressing Upper body dressing   What is the patient wearing?: Bra, Pull over shirt/dress Bra - Perfomed by patient: Thread/unthread right bra strap, Thread/unthread left bra strap, Hook/unhook bra (pull down sports bra)   Pull over shirt/dress - Perfomed by patient: Thread/unthread right sleeve, Thread/unthread left sleeve, Put head through opening, Pull shirt over trunk          Upper body assist Assist Level: No help, No cues   Set up : To obtain clothing/put away  Lower Body Dressing/Undressing Lower body dressing  What is the patient wearing?: Pants, Non-skid slipper socks, Shoes (brief ) Underwear - Performed by patient: Thread/unthread right underwear leg, Thread/unthread left underwear leg, Pull underwear up/down Underwear - Performed by helper: Thread/unthread right underwear leg, Thread/unthread left underwear leg Pants- Performed by patient: Thread/unthread right pants leg, Thread/unthread left pants leg, Pull pants up/down, Fasten/unfasten  pants   Non-skid slipper socks- Performed by patient: Don/doff left sock (1/1)       Shoes - Performed by patient: Don/doff left shoe, Fasten left (1/1)            Lower body assist Assist for lower body dressing: More than reasonable time   Set up :  (To prepare breif)  Toileting Toileting Toileting activity did not occur: No continent bowel/bladder event Toileting steps completed by patient: Adjust clothing prior to toileting, Performs perineal hygiene, Adjust clothing after toileting Toileting steps completed by helper: Adjust clothing prior to toileting    Toileting assist Assist level: Supervision or verbal cues   Transfers Chair/bed transfer   Chair/bed transfer method: Stand pivot Chair/bed transfer assist level: Supervision or verbal cues Chair/bed transfer assistive device: Armrests, Medical sales representative     Max distance: 50 Assist level: Supervision or verbal cues   Wheelchair   Type: Manual Max wheelchair distance: 200 Assist Level: No help, No cues, assistive device, takes more than reasonable amount of time  Cognition Comprehension Comprehension assist level: Understands basic 90% of the time/cues < 10% of the time  Expression Expression assist level: Expresses basic 90% of the time/requires cueing < 10% of the time.  Social Interaction Social Interaction assist level: Interacts appropriately 90% of the time - Needs monitoring or encouragement for participation or interaction.  Problem Solving Problem solving assist level: Solves basic 90% of the time/requires cueing < 10% of the time  Memory Memory assist level: Recognizes or recalls 90% of the time/requires cueing < 10% of the time    Medical Problem List and Plan: 1. Weakness, abnormality of gait, pain secondary to R-AKA  DC today  Will see pt in 1-2 weeks for transitional care management 2. DVT Prophylaxis/Anticoagulation: Pharmaceutical: Lovenox 3. Pain Management: Continue oxycodone  prn.  Patient offered gabapentin 100 daily at bedtime, however she states she does not needed, stopped. 4. Mood: Team to provide ego support. LCSW to follow for evaluation and support.  5. Neuropsych: This patient is capable of making decisions on her own behalf. 6. Skin/Wound Care: Monitor wound for healing daily.   Continue Prevalon boot to left heel 7. Fluids/Electrolytes/Nutrition: Monitor I/O. Continue boost for supplements and to promote wound healing.   BMP within acceptable range on 4/18 8. HTN: Monitor BP.   Continue Norvasc and lisinopril   Labile at times 9. Recent subacute GIB: Had negative work up. Monitor H/H.   Monitor for recurrent melena/GIB.   Continue Protonix BID.  10. Acute on chronic anemia: Increase iron to bid.   Hemoglobin 8.8 on 4/18 (improved)  11. Heme positive stool: Monitor for melena or hematochezia.  13. DMT2: Monitor BS with ac/hs checks. Continue metformin 500 mg bid.   Well controlled 14. Thrombocytosis: Likely reactive. Continue to monitor.   Platelets 462 on 4/18 (improved) 15. UTI   Escherichia coli, sensitive to macrobid--- completed course on 4/19     LOS (Days) 10 A FACE TO FACE EVALUATION WAS PERFORMED  Jessica Abbott Lorie Phenix 01/05/2016 8:53 AM

## 2016-01-05 NOTE — Progress Notes (Signed)
Social Work  Discharge Note  The overall goal for the admission was met for:   Discharge location: [place] [facility]'S ASSIST[facility]-24 HR  Length of Stay: Yes-10 DAYS  Discharge activity level: Yes-SUPERVISION WITH CUEING  Home/community participation: Yes  Services provided included: MD, RD, PT, OT, RN, CM, TR, Pharmacy and SW  Financial Services: Medicare  Follow-up services arranged: Home Health: [facility]-PT,OT,RN, DME: [employer] and Patient/Family request agency HH: [facility], DME: NO PREF  Comments (or additional information):TWO DAUGHTER'S TO PROVIDE 24 HR CARE HAD BEEN PRIOR TO ADMISSION. FAMILY EDUCATION COMPLETED AND ALL READY TO GO HOME  Patient/Family verbalized understanding of follow-up arrangements: Yes  Individual responsible for coordination of the follow-up plan: SELF & GLORIA-DAUGHTER  Confirmed correct DME delivered: [name] 01/05/2016    [name]

## 2016-01-09 ENCOUNTER — Telehealth: Payer: Self-pay

## 2016-01-09 NOTE — Telephone Encounter (Signed)
1. Are you/is patient experiencing any problems since coming home? Are there any questions regarding any aspect of care? No 2. Are there any questions regarding medications administration/dosing? Are meds being taken as prescribed? Patient should review meds with caller to confirm. Meds has been confirmed. 3. Have there been any falls? No 4. Has Home Health been to the house and/or have they contacted you? If not, have you tried to contact them? Can we help you contact them? Home health nurse has been visiting with pt.  5. Are bowels and bladder emptying properly? Are there any unexpected incontinence issues? If applicable, is patient following bowel/bladder programs? No issues. 6. Any fevers, problems with breathing, unexpected pain? No 7. Are there any skin problems or new areas of breakdown? No 8. Has the patient/family member arranged specialty MD follow up (ie cardiology/neurology/renal/surgical/etc)?  Can we help arrange? Follow up appointments have been made.   9. Does the patient need any other services or support that we can help arrange? No 10. Are caregivers following through as expected in assisting the patient? Yes  11. Has the patient quit smoking, drinking alcohol, or using drugs as recommended? Pt is not smoking, drinking alcohol or using drugs.   Pt is aware of appointment on 01/11/16 @ 11:00 am.

## 2016-01-11 ENCOUNTER — Encounter: Payer: Medicare Other | Attending: Physical Medicine & Rehabilitation | Admitting: Physical Medicine & Rehabilitation

## 2016-01-11 ENCOUNTER — Encounter: Payer: Self-pay | Admitting: Physical Medicine & Rehabilitation

## 2016-01-11 VITALS — BP 142/62 | HR 73

## 2016-01-11 DIAGNOSIS — I10 Essential (primary) hypertension: Secondary | ICD-10-CM | POA: Diagnosis not present

## 2016-01-11 DIAGNOSIS — Z9889 Other specified postprocedural states: Secondary | ICD-10-CM | POA: Insufficient documentation

## 2016-01-11 DIAGNOSIS — I739 Peripheral vascular disease, unspecified: Secondary | ICD-10-CM | POA: Diagnosis not present

## 2016-01-11 DIAGNOSIS — I82491 Acute embolism and thrombosis of other specified deep vein of right lower extremity: Secondary | ICD-10-CM | POA: Diagnosis not present

## 2016-01-11 DIAGNOSIS — I129 Hypertensive chronic kidney disease with stage 1 through stage 4 chronic kidney disease, or unspecified chronic kidney disease: Secondary | ICD-10-CM | POA: Diagnosis not present

## 2016-01-11 DIAGNOSIS — X58XXXA Exposure to other specified factors, initial encounter: Secondary | ICD-10-CM | POA: Diagnosis not present

## 2016-01-11 DIAGNOSIS — I272 Other secondary pulmonary hypertension: Secondary | ICD-10-CM | POA: Insufficient documentation

## 2016-01-11 DIAGNOSIS — R269 Unspecified abnormalities of gait and mobility: Secondary | ICD-10-CM | POA: Diagnosis not present

## 2016-01-11 DIAGNOSIS — M1990 Unspecified osteoarthritis, unspecified site: Secondary | ICD-10-CM | POA: Insufficient documentation

## 2016-01-11 DIAGNOSIS — E119 Type 2 diabetes mellitus without complications: Secondary | ICD-10-CM | POA: Diagnosis not present

## 2016-01-11 DIAGNOSIS — Z8719 Personal history of other diseases of the digestive system: Secondary | ICD-10-CM

## 2016-01-11 DIAGNOSIS — M24561 Contracture, right knee: Secondary | ICD-10-CM | POA: Diagnosis not present

## 2016-01-11 DIAGNOSIS — Z89611 Acquired absence of right leg above knee: Secondary | ICD-10-CM | POA: Insufficient documentation

## 2016-01-11 DIAGNOSIS — Z87891 Personal history of nicotine dependence: Secondary | ICD-10-CM | POA: Diagnosis not present

## 2016-01-11 DIAGNOSIS — S90921A Unspecified superficial injury of right foot, initial encounter: Secondary | ICD-10-CM | POA: Diagnosis not present

## 2016-01-11 DIAGNOSIS — E118 Type 2 diabetes mellitus with unspecified complications: Secondary | ICD-10-CM

## 2016-01-11 NOTE — Progress Notes (Addendum)
Subjective:    Patient ID: Jessica Abbott, female    DOB: Jul 03, 1941, 75 y.o.   MRN: [id number]  HPI  75 y.o. female with history of HTN, DMT2, CKD, pulmonary HTN, chronic right knee contracture, PVD s/p left femoral to dorsalis pedis bypass who developed right foot wound and superficial occlusion who presents for transitional care management after receiving CIR for right AKA.   Admit date: 12/26/2015 Discharge date: 01/05/2016 At discharge pt was encouraged to keep her stump clean.  She has not received a shrinker.  She has been checking her FSBG, which have been running 103-150.  She denies pain of any kind.  She continues to wear her pravolon boot to her LLE at night.   Since discharge she has been doing well.  Therapies: Receiving home health [facility] nurse), She declined HH therapies.   Mobility: Wheelchair DME: None  Pain Inventory Average Pain 1 Pain Right Now 0 My pain is no pain  In the last 24 hours, has pain interfered with the following? General activity 1 Relation with others 1 Enjoyment of life 1 What TIME of day is your pain at its worst? night Sleep (in general) Good  Pain is worse with: sitting Pain improves with: no answer Relief from Meds: 9  Mobility use a wheelchair needs help with transfers transfers alone  Function disabled: date disabled .  Neuro/Psych bladder control problems  Prior Studies Any changes since last visit?  no  Physicians involved in your care Any changes since last visit?  no   Family History  Problem Relation Age of Onset  . Heart disease Father    Social History   Social History  . Marital Status: Married    Spouse Name: N/A  . Number of Children: N/A  . Years of Education: N/A   Social History Main Topics  . Smoking status: Former Smoker    Quit date: 10/27/2013  . Smokeless tobacco: Never Used  . Alcohol Use: No  . Drug Use: No  . Sexual Activity: Not Asked   Other Topics Concern  . None   Social History  Narrative   Past Surgical History  Procedure Laterality Date  . Back surgery  2002  . Abdominal hysterectomy    . Femoral artery arteriogram  04/20/2015  . Esophagogastroduodenoscopy (egd) with propofol N/A 11/23/2015    Procedure: ESOPHAGOGASTRODUODENOSCOPY (EGD) WITH PROPOFOL;  Surgeon: Ronald Lobo, MD;  Location: [facility];  Service: Endoscopy;  Laterality: N/A;  . Colonoscopy N/A 11/24/2015    Procedure: COLONOSCOPY;  Surgeon: Ronald Lobo, MD;  [facility] END[facility]COPY;  Service: Endoscopy;  Laterality: N/A;  . Givens capsule study N/A 11/26/2015    Procedure: GIVENS CAPSULE STUDY;  Surgeon: Ronald Lobo, MD;  Location: [facility];  Service: Endoscopy;  Laterality: N/A;  . Amputation Right 12/23/2015    Procedure: RIGHT  ABOVE KNEE AMPUTATION;  Surgeon: Angelia Mould, MD;  Location: [facility];  Service: Vascular;  Laterality: Right;   Past Medical History  Diagnosis Date  . Hypertension   . Peripheral vascular disease ([place])   . Diabetes mellitus without complication ([place])   . Arthritis   . Peripheral arterial disease ([place])   . Chronic kidney disease   . SOB (shortness of breath)   . History of pneumonia   . History of bronchitis   . Stress incontinence   . Anemia   . History of blood transfusion   . Pulmonary hypertension ([place])     [id number] echo: PA systolic pressure  severely increaed, PA peak pressure 66 mm Hg.   Marland Kitchen Aortic regurgitation     moderate AR by 11/25/15 echo  . 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   BP 142/62 mmHg  Pulse 73  SpO2 100%  Opioid Risk Score:   Fall Risk Score:  `1  Depression screen PHQ 2/9  Depression screen PHQ 2/9 01/11/2016  Decreased Interest 0  Down, Depressed, Hopeless 0  PHQ - 2 Score 0  Altered sleeping 1  Tired, decreased energy 0  Change in appetite 0  Feeling bad or failure about yourself  0  Trouble concentrating 0  Moving slowly or fidgety/restless 0  Suicidal thoughts 0  PHQ-9 Score 1    Review of Systems  Musculoskeletal: Negative for back pain.  Skin: Negative for rash.  Neurological: Negative for weakness and numbness.  All other systems reviewed and are negative.      Objective:   Physical Exam Constitutional: She appears well-developed and well-nourished. NAD. HENT:  Normocephalic and atraumatic.   Eyes: Conjunctivae are normal.    Cardiovascular: Normal rate and regular rhythm. No murmur heard. Respiratory: Effort normal and breath sounds normal. No stridor. No respiratory distress. She has no wheezes.   GI: Soft. Bowel sounds are normal. She exhibits no distension. There is no tenderness.  Musculoskeletal: She exhibits minimal edema and minimal tenderness.  LLE with well healed old surgical incisions.  R-AKA well shaped Neurological: She is alert and oriented.  Motor: B/l UE 5/5 LLE hip flexion 5/5, knee extension 5/5, ankle dorsi/plantar flexion 5/5 Right hip flexion 5/5     Skin: Left heel boggy. AKA with staples C/D/I.  Psychiatric: Her speech is normal. Her affect is normal.     Assessment & Plan:  75 y.o. female with history of HTN, DMT2, CKD, pulmonary HTN, chronic right knee contracture, PVD s/p left femoral to dorsalis pedis bypass who developed right foot wound and superficial occlusion who presents for transitional care management after receiving CIR for right AKA.    1. S/p Right AKA  Educated pt on need to PT, will resume PT  Referral for stump shrinker  Follow up with Surgery in 2 weeks  Follow up with PCP in 1 weeks.   Encouraged cleaning of stump daily   2. Boggy left heel  Cont pressure relief measures  3. HTN  Cont Norvasc and lisinopril    Pt to follow up with PCP next week  4. Subacute GIB  Negative work up in hospital   No reported bloody stools   Continue Protonix BID  5. DMT2:   Cont meds  Pt to follow up with PCP next week  Cont to be well controlled  6. Abnormality of gait  Cont wheelchair  Referral to  PT  Referral reviewed Medications reviewed All questions answered.

## 2016-01-13 ENCOUNTER — Encounter: Payer: Self-pay | Admitting: Vascular Surgery

## 2016-01-18 ENCOUNTER — Ambulatory Visit (INDEPENDENT_AMBULATORY_CARE_PROVIDER_SITE_OTHER): Payer: Medicare Other | Admitting: Vascular Surgery

## 2016-01-18 ENCOUNTER — Encounter: Payer: Self-pay | Admitting: Vascular Surgery

## 2016-01-18 VITALS — BP 144/55 | HR 70 | Temp 97.8°F | Resp 18 | Ht 62.0 in | Wt 122.0 lb

## 2016-01-18 DIAGNOSIS — I739 Peripheral vascular disease, unspecified: Secondary | ICD-10-CM

## 2016-01-18 NOTE — Progress Notes (Signed)
Patient name: Jessica Abbott MRN: [id number] DOB: 1941-08-20 Sex: female  REASON FOR VISIT: Follow up after right above-the-knee amputation.  HPI: Jessica Abbott is a 75 y.o. female who had presented with gangrene of her right leg. 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 This was performed on 12/23/2015. She returns for a one-month follow up visit.  She has no specific complaints. She denies fever or chills.  Current Outpatient Prescriptions  Medication Sig Dispense Refill  . acetaminophen (TYLENOL) 500 MG tablet Take 500 mg by mouth every 6 (six) hours as needed (pain).    Marland Kitchen albuterol (PROVENTIL HFA;VENTOLIN HFA) 108 (90 Base) MCG/ACT inhaler Inhale 2 puffs into the lungs every 6 (six) hours as needed for wheezing or shortness of breath.    Marland Kitchen amLODipine (NORVASC) 5 MG tablet Take 5 mg by mouth daily.    Marland Kitchen aspirin EC 81 MG tablet Take 81 mg by mouth daily.     . baclofen (LIORESAL) 10 MG tablet Take 0.5 tablets (5 mg total) by mouth 4 (four) times daily. 60 each 0  . Cholecalciferol (VITAMIN D3) 2000 units capsule Take 2,000 Units by mouth daily.     Marland Kitchen docusate sodium (COLACE) 100 MG capsule Take 100 mg by mouth daily as needed (constipation).    . ferrous sulfate 325 (65 FE) MG tablet Take 1 tablet (325 mg total) by mouth 2 (two) times daily with a meal. 60 tablet 1  . lactose free nutrition (BOOST) LIQD Take 237 mLs by mouth daily as needed (occasionally).     Marland Kitchen lisinopril (PRINIVIL,ZESTRIL) 10 MG tablet Take 10 mg by mouth daily.    . metFORMIN (GLUCOPHAGE) 500 MG tablet Take 500 mg by mouth daily with breakfast.    . pantoprazole (PROTONIX) 40 MG tablet Take 1 tablet (40 mg total) by mouth 2 (two) times daily. 60 tablet 0  . oxyCODONE (OXY IR/ROXICODONE) 5 MG immediate release tablet Take 1 tablet (5 mg total) by mouth 2 (two) times daily as needed for severe pain. (Patient not taking: Reported on 01/11/2016) 15 tablet 0   No current  facility-administered medications for this visit.    REVIEW OF SYSTEMS:  [X]  denotes positive finding, [ ]  denotes negative finding Cardiac  Comments:  Chest pain or chest pressure:    Shortness of breath upon exertion:    Short of breath when lying flat:    Irregular heart rhythm:    Constitutional    Fever or chills:      PHYSICAL EXAM: Filed Vitals:   01/18/16 1107 01/18/16 1110  BP: 147/54 144/55  Pulse: 70   Temp: 97.8 F (36.6 C)   TempSrc: Oral   Resp: 18   Height: 5\' 2"  (1.575 m)   Weight: 122 lb (55.339 kg)   SpO2: 100%     GENERAL: The patient is a well-nourished female, in no acute distress. The vital signs are documented above. CARDIOVASCULAR: There is a regular rate and rhythm. PULMONARY: There is good air exchange bilaterally without wheezing or rales. Her right above-the-knee amputation site is healing nicely.  MEDICAL ISSUES:  STATUS POST RIGHT ABOVE-THE-KNEE AMPUTATION: The patient is doing well status post right above-the-knee amputation. I have written her a prescription for a prosthesis. I'll see her back in 6 months with ABIs we can continue to follow her left foot closely. She does call sooner she has problems.  [name] [facility] [place]: [phone]

## 2016-01-18 NOTE — Progress Notes (Signed)
Filed Vitals:   01/18/16 1107 01/18/16 1110  BP: 147/54 144/55  Pulse: 70   Temp: 97.8 F (36.6 C)   TempSrc: Oral   Resp: 18   Height: 5\' 2"  (1.575 m)   Weight: 122 lb (55.339 kg)   SpO2: 100%

## 2016-01-30 ENCOUNTER — Telehealth: Payer: Self-pay | Admitting: Physical Medicine & Rehabilitation

## 2016-01-30 NOTE — Telephone Encounter (Signed)
Please check on therapy for Jessica Abbott.

## 2016-01-30 NOTE — Telephone Encounter (Signed)
Patient has not heard anything back about starting outpatient therapy.  Please call patient.

## 2016-02-20 ENCOUNTER — Ambulatory Visit: Payer: Medicare Other | Admitting: Physical Therapy

## 2016-02-22 NOTE — Addendum Note (Signed)
Addended by: Thresa Ross C on: 02/22/2016 11:53 AM   Modules accepted: Orders

## 2016-03-07 ENCOUNTER — Ambulatory Visit: Payer: Medicare Other | Attending: Physical Medicine & Rehabilitation | Admitting: Physical Therapy

## 2016-03-08 ENCOUNTER — Telehealth: Payer: Self-pay

## 2016-03-08 DIAGNOSIS — I739 Peripheral vascular disease, unspecified: Secondary | ICD-10-CM

## 2016-03-08 DIAGNOSIS — L819 Disorder of pigmentation, unspecified: Secondary | ICD-10-CM

## 2016-03-08 NOTE — Telephone Encounter (Signed)
Phone call from pt's daughter.  Reported the pt. has noted discoloration on the Left LE from approx. 1 " above ankle area to the foot.  Reported the discoloration has been noticeable over past 2 weeks.  Voiced concern with change in color of left foot, since she had a right AKA.  Denied any change in temperature of left LE.  Denied any complaints of pain, heaviness, weakness, swelling, or open sores of Left LE.  Is requesting an appt. for evaluation.  Advised daughter, will have a scheduler contact her re: an appt for pt.  Agreed.

## 2016-03-10 NOTE — Telephone Encounter (Signed)
Sched lab 6/27 at 8 and NP 6/28 at 10:45. Spoke to pt's daughter to inform them of appts.

## 2016-03-13 ENCOUNTER — Ambulatory Visit (HOSPITAL_COMMUNITY)
Admission: RE | Admit: 2016-03-13 | Discharge: 2016-03-13 | Disposition: A | Discharge status: Home / Self Care | Payer: Medicare Other | Source: Ambulatory Visit | Attending: Vascular Surgery | Admitting: Vascular Surgery

## 2016-03-13 ENCOUNTER — Encounter: Payer: Self-pay | Admitting: Vascular Surgery

## 2016-03-13 DIAGNOSIS — I129 Hypertensive chronic kidney disease with stage 1 through stage 4 chronic kidney disease, or unspecified chronic kidney disease: Secondary | ICD-10-CM | POA: Insufficient documentation

## 2016-03-13 DIAGNOSIS — E1122 Type 2 diabetes mellitus with diabetic chronic kidney disease: Secondary | ICD-10-CM | POA: Diagnosis not present

## 2016-03-13 DIAGNOSIS — I739 Peripheral vascular disease, unspecified: Secondary | ICD-10-CM | POA: Diagnosis not present

## 2016-03-13 DIAGNOSIS — R9439 Abnormal result of other cardiovascular function study: Secondary | ICD-10-CM | POA: Diagnosis not present

## 2016-03-13 DIAGNOSIS — L819 Disorder of pigmentation, unspecified: Secondary | ICD-10-CM | POA: Insufficient documentation

## 2016-03-13 DIAGNOSIS — N189 Chronic kidney disease, unspecified: Secondary | ICD-10-CM | POA: Insufficient documentation

## 2016-03-14 ENCOUNTER — Ambulatory Visit (INDEPENDENT_AMBULATORY_CARE_PROVIDER_SITE_OTHER): Payer: Medicare Other | Admitting: Family

## 2016-03-14 VITALS — BP 148/65 | HR 72 | Temp 97.2°F | Resp 14 | Ht 62.0 in | Wt 137.0 lb

## 2016-03-14 DIAGNOSIS — Z89611 Acquired absence of right leg above knee: Secondary | ICD-10-CM

## 2016-03-14 DIAGNOSIS — I779 Disorder of arteries and arterioles, unspecified: Secondary | ICD-10-CM

## 2016-03-14 DIAGNOSIS — Z87891 Personal history of nicotine dependence: Secondary | ICD-10-CM

## 2016-03-14 NOTE — Progress Notes (Signed)
Filed Vitals:   03/14/16 1050 03/14/16 1053  BP: 149/61 148/65  Pulse: 67 72  Temp: 97.2 F (36.2 C)   Resp: 14   Height: 5\' 2"  (1.575 m)   Weight: 137 lb (62.143 kg)   SpO2: 99%

## 2016-03-14 NOTE — Progress Notes (Signed)
[facility] [place]   CC: Follow up peripheral artery occlusive disease   History of Present Illness Jessica Abbott is a 75 y.o. female patient of Dr. 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 This was performed on 12/23/2015. She is also s/p left leg arterial bypass graft using her ipsilateral vein, performed in [place] in September 2016. Dr. Scot Dock last saw pt on 01/18/16. At that time the patient was doing well status post right above-the-knee amputation. Dr. Scot Dock wrote her a prescription for a prosthesis. Dr. Scot Dock advised pt to return in 6 months with ABIs to continue to follow her left foot closely. She was to call sooner if she had problems.  Pt returns today after a phone call from her daughter on 03/08/16. Reported the pt has noted discoloration on the Left LE from approximately 1" above ankle area to the foot. Reported the discoloration has been noticeable over past 2 weeks. Voiced concern with change in color of left foot, since she had a right AKA. Denied any change in temperature of left LE. Denied any complaints of pain, heaviness, weakness, swelling, or open sores of Left LE. Is requesting an appt. for evaluation.  Pt denies pain in her left LE, has no discolaration now, no wounds. She has connected with [employer] and is using a stump shrinker on the right AKA. She reports not walking much since the right AKA.  Pt Diabetic: Yes, in good control per pt Pt smoker: former smoker, quit in 2016, started at age 73  Pt meds include: Statin :No Betablocker: No ASA: Yes Other anticoagulants/antiplatelets: no  Past Medical History  Diagnosis Date  . Hypertension   . Peripheral vascular disease ([place])   . Diabetes mellitus without complication ([place])   . Arthritis   . Peripheral arterial disease ([place])   . Chronic kidney disease   . SOB (shortness of  breath)   . History of pneumonia   . History of bronchitis   . Stress incontinence   . Anemia   . History of blood transfusion   . Pulmonary hypertension ([place])     Q000111Q echo: PA systolic pressure severely increaed, PA peak pressure 66 mm Hg.   Marland Kitchen Aortic regurgitation     moderate AR by 11/25/15 echo  . S/P AKA (above knee amputation) unilateral ([place]) 12/26/2015    Social History Social History  Substance Use Topics  . Smoking status: Former Smoker    Quit date: 10/27/2013  . Smokeless tobacco: Never Used  . Alcohol Use: No    Family History Family History  Problem Relation Age of Onset  . Heart disease Father     Past Surgical History  Procedure Laterality Date  . Back surgery  2002  . Abdominal hysterectomy    . Femoral artery arteriogram  04/20/2015  . Esophagogastroduodenoscopy (egd) with propofol N/A 11/23/2015    Procedure: ESOPHAGOGASTRODUODENOSCOPY (EGD) WITH PROPOFOL;  Surgeon: Ronald Lobo, MD;  Location: [place];  Service: Endoscopy;  Laterality: N/A;  . Colonoscopy N/A 11/24/2015    Procedure: COLONOSCOPY;  Surgeon: Ronald Lobo, MD;  Location[facility] END[facility]COPY;  Service: Endoscopy;  Laterality: N/A;  . Givens capsule study N/A 11/26/2015    Procedure: GIVENS CAPSULE STUDY;  Surgeon: Ronald Lobo, MD;  Location: [place];  Service: Endoscopy;  Laterality: N/A;  . Amputation Right 12/23/2015    Procedure: RIGHT  ABOVE KNEE AMPUTATION;  Surgeon: Harrell Gave  Nicole Cella, MD;  Location: [place];  Service: Vascular;  Laterality: Right;    Allergies  Allergen Reactions  . Gabapentin Other (See Comments)    Per patient, 300mg  dose made her extremely weak and unable to move. Ok with lower doses.  . Gabapentin (Once-Daily) Other (See Comments)    Per patient, 300mg  dose made her extremely weak and unable to move. Ok with lower doses.  . Other Other (See Comments)    Per patient, 300mg  dose made her extremely weak and unable to move. Ok with lower doses.     Current Outpatient Prescriptions  Medication Sig Dispense Refill  . acetaminophen (TYLENOL) 500 MG tablet Take 500 mg by mouth every 6 (six) hours as needed (pain).    Marland Kitchen albuterol (PROVENTIL HFA;VENTOLIN HFA) 108 (90 Base) MCG/ACT inhaler Inhale 2 puffs into the lungs every 6 (six) hours as needed for wheezing or shortness of breath.    Marland Kitchen amLODipine (NORVASC) 5 MG tablet Take 5 mg by mouth daily.    Marland Kitchen aspirin EC 81 MG tablet Take 81 mg by mouth daily.     . baclofen (LIORESAL) 10 MG tablet Take 0.5 tablets (5 mg total) by mouth 4 (four) times daily. 60 each 0  . Cholecalciferol (VITAMIN D3) 2000 units capsule Take 2,000 Units by mouth daily.     Marland Kitchen docusate sodium (COLACE) 100 MG capsule Take 100 mg by mouth daily as needed (constipation).    . ferrous sulfate 325 (65 FE) MG tablet Take 1 tablet (325 mg total) by mouth 2 (two) times daily with a meal. 60 tablet 1  . lactose free nutrition (BOOST) LIQD Take 237 mLs by mouth daily as needed (occasionally).     Marland Kitchen lisinopril (PRINIVIL,ZESTRIL) 10 MG tablet Take 10 mg by mouth daily.    . metFORMIN (GLUCOPHAGE) 500 MG tablet Take 500 mg by mouth daily with breakfast. Reported on 03/14/2016    . oxyCODONE (OXY IR/ROXICODONE) 5 MG immediate release tablet Take 1 tablet (5 mg total) by mouth 2 (two) times daily as needed for severe pain. (Patient not taking: Reported on 01/11/2016) 15 tablet 0  . pantoprazole (PROTONIX) 40 MG tablet Take 1 tablet (40 mg total) by mouth 2 (two) times daily. (Patient not taking: Reported on 03/14/2016) 60 tablet 0   No current facility-administered medications for this visit.    ROS: See HPI for pertinent positives and negatives.   Physical Examination  Filed Vitals:   03/14/16 1050 03/14/16 1053  BP: 149/61 148/65  Pulse: 67 72  Temp: 97.2 F (36.2 C)   Resp: 14   Height: 5\' 2"  (1.575 m)   Weight: 137 lb (62.143 kg)   SpO2: 99%    Body mass index is 25.05 kg/(m^2).  General: A&O x 3, WDWN Gait: in  w/c Eyes: PERRLA. Pulmonary: Respirations are non labored Cardiac: regular Rythm                         VASCULAR EXAM: Extremities without ischemic changes, without Gangrene; without open wounds. Right AKA stump is well healed with no ulcers or wounds.  LE Pulses Right Left       FEMORAL  not palpable, seated in w/c  not palpable, seated in w/c        POPLITEAL  AKA   1+ palpable       POSTERIOR TIBIAL  AKA   not palpable        DORSALIS PEDIS      ANTERIOR TIBIAL AKA  2+ palpable    Abdomen: soft, NT, no palpable masses. Skin: no rashes, no ulcers. Musculoskeletal: no muscle wasting or atrophy, see Extremities.  Neurologic: A&O X 3; Appropriate Affect , sensation is normal; MOTOR FUNCTION:  moving all extremities equally, motor strength 5/5 throughout. Speech is fluent/normal.  CN 2-12 intact.    Non-Invasive Vascular Imaging: DATE: 03/14/2016 ABI:  RIGHT: AKA  LEFT: 0.57, Waveforms: absent PT, monophasic DP, TBI: 0.14, toe pressure: 23  ASSESSMENT: Jessica Abbott is a 75 y.o. female who is s/p right above-the-knee amputation on 12/23/15 for gangrene of her right leg. She is also s/p left leg arterial bypass graft using her ipsilateral vein, performed in [place] in September 2016.  She returns today after phone call a few days ago c/o change in color of left lower leg; however, there is no cyanosis or rubor at this time, no signs of ischemia, no ulcers or open wounds. She denies rest pain. She does not walk since the right AKA, is working with [employer] for a prosthetic fitting.  Left LE ABI today is 57% with monophasic DP waveform and absent PT waveform. The left TBI is 0.14 with a toe pressure of 23.  We will need a left LE arterial duplex; this could not be performed today; see Plan. 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 Her left DP pulse is 2+ palpable.   Her atherosclerotic risk factors include well controled DM (self reported), and recent former smoker. She takes a daily 81 mg ASA, is not on a statin.    PLAN:  Based on the patient's vascular studies and examination, pt will return to clinic in 1-3 weeks with left LE arterial duplex of previous left leg bypass graft. Follow up with NP on a day that Dr. Scot Dock is in the office.   I discussed in depth with the patient the nature of atherosclerosis, and emphasized the importance of maximal medical management including strict control of blood pressure, blood glucose, and lipid levels, obtaining regular exercise, and continued cessation of smoking.  The patient is aware that without maximal medical management the underlying atherosclerotic disease process will progress, limiting the benefit of any interventions.  The patient was given information about PAD including signs, symptoms, treatment, what symptoms should prompt the patient to seek immediate medical care, and risk reduction measures to take.  Clemon Chambers, RN, MSN, FNP-C [facility] Phone: [phone]  Clinic MD: Scot Dock  03/14/2016 11:28 AM

## 2016-03-15 ENCOUNTER — Encounter: Payer: Self-pay | Admitting: Family

## 2016-03-26 ENCOUNTER — Other Ambulatory Visit: Payer: Self-pay | Admitting: *Deleted

## 2016-03-26 DIAGNOSIS — I739 Peripheral vascular disease, unspecified: Secondary | ICD-10-CM

## 2016-03-26 DIAGNOSIS — Z48812 Encounter for surgical aftercare following surgery on the circulatory system: Secondary | ICD-10-CM

## 2016-03-27 ENCOUNTER — Encounter: Payer: Medicare Other | Admitting: Physical Therapy

## 2016-03-30 ENCOUNTER — Encounter: Payer: Self-pay | Admitting: Family

## 2016-04-04 ENCOUNTER — Ambulatory Visit (HOSPITAL_COMMUNITY)
Admission: RE | Admit: 2016-04-04 | Discharge: 2016-04-04 | Disposition: A | Discharge status: Home / Self Care | Payer: Medicare Other | Source: Ambulatory Visit | Attending: Family | Admitting: Family

## 2016-04-04 ENCOUNTER — Encounter: Payer: Self-pay | Admitting: Family

## 2016-04-04 ENCOUNTER — Ambulatory Visit (INDEPENDENT_AMBULATORY_CARE_PROVIDER_SITE_OTHER): Payer: Medicare Other | Admitting: Family

## 2016-04-04 VITALS — BP 132/44 | HR 58 | Temp 97.6°F | Resp 12 | Ht 62.0 in | Wt 134.0 lb

## 2016-04-04 DIAGNOSIS — N189 Chronic kidney disease, unspecified: Secondary | ICD-10-CM | POA: Diagnosis not present

## 2016-04-04 DIAGNOSIS — E1122 Type 2 diabetes mellitus with diabetic chronic kidney disease: Secondary | ICD-10-CM | POA: Insufficient documentation

## 2016-04-04 DIAGNOSIS — I7025 Atherosclerosis of native arteries of other extremities with ulceration: Secondary | ICD-10-CM | POA: Diagnosis not present

## 2016-04-04 DIAGNOSIS — I739 Peripheral vascular disease, unspecified: Secondary | ICD-10-CM

## 2016-04-04 DIAGNOSIS — I272 Other secondary pulmonary hypertension: Secondary | ICD-10-CM | POA: Insufficient documentation

## 2016-04-04 DIAGNOSIS — Z48812 Encounter for surgical aftercare following surgery on the circulatory system: Secondary | ICD-10-CM | POA: Insufficient documentation

## 2016-04-04 DIAGNOSIS — Z89611 Acquired absence of right leg above knee: Secondary | ICD-10-CM

## 2016-04-04 DIAGNOSIS — I779 Disorder of arteries and arterioles, unspecified: Secondary | ICD-10-CM

## 2016-04-04 DIAGNOSIS — Z87891 Personal history of nicotine dependence: Secondary | ICD-10-CM

## 2016-04-04 DIAGNOSIS — I129 Hypertensive chronic kidney disease with stage 1 through stage 4 chronic kidney disease, or unspecified chronic kidney disease: Secondary | ICD-10-CM | POA: Diagnosis not present

## 2016-04-04 DIAGNOSIS — R0989 Other specified symptoms and signs involving the circulatory and respiratory systems: Secondary | ICD-10-CM | POA: Diagnosis present

## 2016-04-04 NOTE — Progress Notes (Signed)
[facility] [place]   CC: Follow up peripheral artery occlusive disease  History of Present Illness Jessica Abbott is a 75 y.o. female patient of Dr. 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 This was performed on 12/23/2015. She is also s/p left leg arterial bypass graft using her ipsilateral vein, performed in [place] in September 2016. Dr. Scot Dock last saw pt on 01/18/16. At that time the patient was doing well status post right above-the-knee amputation. Dr. Scot Dock wrote her a prescription for a prosthesis. Dr. Scot Dock advised pt to return in 6 months with ABIs to continue to follow her left foot closely. She was to call sooner if she had problems. Pt returns today for arterial duplex of left leg.  Pt denies pain in her left LE, has no discoloration now, no wounds. She has connected with [employer] and is using a stump shrinker on the right AKA, is receiving physical therapy in preparation for walking with prosthesis.  Pt Diabetic: Yes, in good control per pt Pt smoker: former smoker, quit in 2016, started at age 78  Pt meds include: Statin :No Betablocker: No ASA: Yes Other anticoagulants/antiplatelets: no    Past Medical History  Diagnosis Date  . Hypertension   . Peripheral vascular disease ([place])   . Diabetes mellitus without complication ([place])   . Arthritis   . Peripheral arterial disease ([place])   . Chronic kidney disease   . SOB (shortness of breath)   . History of pneumonia   . History of bronchitis   . Stress incontinence   . Anemia   . History of blood transfusion   . Pulmonary hypertension ([place])     Q000111Q echo: PA systolic pressure severely increaed, PA peak pressure 66 mm Hg.   Marland Kitchen Aortic regurgitation     moderate AR by 11/25/15 echo  . 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 Smoking status: Former Smoker    Quit date: 10/27/2013  . Smokeless tobacco: Never Used  . Alcohol Use: No    Family History Family History  Problem Relation Age of Onset  . Heart disease Father     Past Surgical History  Procedure Laterality Date  . Back surgery  2002  . Abdominal hysterectomy    . Femoral artery arteriogram  04/20/2015  . Esophagogastroduodenoscopy (egd) with propofol N/A 11/23/2015    Procedure: ESOPHAGOGASTRODUODENOSCOPY (EGD) WITH PROPOFOL;  Surgeon: Ronald Lobo, MD;  Location: [place];  Service: Endoscopy;  Laterality: N/A;  . Colonoscopy N/A 11/24/2015    Procedure: COLONOSCOPY;  Surgeon: Ronald Lobo, MD;  Location[facility]COPY;  Service: Endoscopy;  Laterality: N/A;  . Givens capsule study N/A 11/26/2015    Procedure: GIVENS CAPSULE STUDY;  Surgeon: Ronald Lobo, MD;  Location: [place];  Service: Endoscopy;  Laterality: N/A;  . Amputation Right 12/23/2015    Procedure: RIGHT  ABOVE KNEE AMPUTATION;  Surgeon: Angelia Mould, MD;  Location: [place];  Service: Vascular;  Laterality: Right;    Allergies  Allergen Reactions  . Gabapentin Other (See Comments)    Per patient, 300mg  dose made her extremely weak and unable to move. Ok with lower doses.  . Gabapentin (Once-Daily) Other (See Comments)    Per patient, 300mg  dose made her extremely weak and unable to move. Ok with lower doses.  . Other  Other (See Comments)    Per patient, 300mg  dose made her extremely weak and unable to move. Ok with lower doses.    Current Outpatient Prescriptions  Medication Sig Dispense Refill  . acetaminophen (TYLENOL) 500 MG tablet Take 500 mg by mouth every 6 (six) hours as needed (pain).    Marland Kitchen albuterol (PROVENTIL HFA;VENTOLIN HFA) 108 (90 Base) MCG/ACT inhaler Inhale 2 puffs into the lungs every 6 (six) hours as needed for wheezing or shortness of breath.    Marland Kitchen amLODipine (NORVASC) 5 MG tablet Take 5 mg by mouth daily.    Marland Kitchen  aspirin EC 81 MG tablet Take 81 mg by mouth daily.     . baclofen (LIORESAL) 10 MG tablet Take 0.5 tablets (5 mg total) by mouth 4 (four) times daily. 60 each 0  . Cholecalciferol (VITAMIN D3) 2000 units capsule Take 2,000 Units by mouth daily.     Marland Kitchen docusate sodium (COLACE) 100 MG capsule Take 100 mg by mouth daily as needed (constipation).    . ferrous sulfate 325 (65 FE) MG tablet Take 1 tablet (325 mg total) by mouth 2 (two) times daily with a meal. 60 tablet 1  . lactose free nutrition (BOOST) LIQD Take 237 mLs by mouth daily as needed (occasionally).     Marland Kitchen lisinopril (PRINIVIL,ZESTRIL) 10 MG tablet Take 10 mg by mouth daily.    . metFORMIN (GLUCOPHAGE) 500 MG tablet Take 500 mg by mouth daily with breakfast. Reported on 03/14/2016    . oxyCODONE (OXY IR/ROXICODONE) 5 MG immediate release tablet Take 1 tablet (5 mg total) by mouth 2 (two) times daily as needed for severe pain. 15 tablet 0  . pantoprazole (PROTONIX) 40 MG tablet Take 1 tablet (40 mg total) by mouth 2 (two) times daily. 60 tablet 0   No current facility-administered medications for this visit.    ROS: See HPI for pertinent positives and negatives.   Physical Examination  Filed Vitals:   04/04/16 0948  BP: 132/44  Pulse: 58  Temp: 97.6 F (36.4 C)  TempSrc: Oral  Resp: 12  Height: 5\' 2"  (1.575 m)  Weight: 134 lb (60.782 kg)  SpO2: 99%   Body mass index is 24.5 kg/(m^2).  General: A&O x 3, WDWN Gait: in w/c Eyes: PERRLA. Pulmonary: Respirations are non labored Cardiac: regular rythm   VASCULAR EXAM: Extremities without ischemic changes, without Gangrene; without open wounds. Right AKA stump is well healed with no ulcers or wounds.     LE Pulses Right Left   FEMORAL not palpable, seated in w/c  palpable    POPLITEAL AKA   1+ palpable   POSTERIOR TIBIAL AKA  not palpable    DORSALIS PEDIS  ANTERIOR TIBIAL AKA  2+ palpable    Abdomen: soft, NT, no palpable masses. Skin: no rashes, no ulcers. Musculoskeletal: no muscle wasting or atrophy, see Extremities. Neurologic: A&O X 3; Appropriate Affect , sensation is normal; MOTOR FUNCTION: moving all extremities equally, motor strength 5/5 throughout. Speech is fluent/normal.  CN 2-12 intact.         ASSESSMENT: Joud Bustillo is a 75 y.o. female who is s/p right above-the-knee amputation on 12/23/15 for gangrene of her right leg. She is also s/p left leg arterial bypass graft using her ipsilateral vein, performed in [place] in September 2016.  She currently has no pain in her left leg, no signs of ischemia in the left LE and right AKA stump. She is receiving physical therapy in preparation for walking  with a prosthesis.  Today's left LE arterial duplex suggests no internal vessel narrowing within the bpg or anastomosis. Focal increase in velocity at the distal anastomosis (274 cm/s). Patent bpg branches which may be resulting in competing flow based on distal bypass graft velocites. No previous duplex exam at this facility for comparison.  Left ABI of 03/13/16 was 57%, moderate arterial occlusive disease, monophasic DP, absent PT, toe pressure of 23, 0.14 TBI. I discussed with Dr. Scot Dock pt's HPI, physical exam results, results of today's left LE arterial duplex and last ABI results from 03/13/16; see Plan.  Her atherosclerotic risk factors include good control of her DM currently and 52 years of smoking, quit in 2016.  PLAN:  Daily seated leg exercises discussed and demonstrated. Based on the patient's vascular studies and examination, pt will return to clinic in 6 months with left LE arterial duplex and left ABI. I advised pt to notify us if she develops ulcers or concerns re circulation of her left foot/leg.  I  discussed in depth with the patient the nature of atherosclerosis, and emphasized the importance of maximal medical management including strict control of blood pressure, blood glucose, and lipid levels, obtaining regular exercise, and continued cessation of smoking.  The patient is aware that without maximal medical management the underlying atherosclerotic disease process will progress, limiting the benefit of any interventions.  The patient was given information about PAD including signs, symptoms, treatment, what symptoms should prompt the patient to seek immediate medical care, and risk reduction measures to take.  Clemon Chambers, RN, MSN, FNP-C [facility] Phone: [phone]  Clinic MD: Scot Dock  04/04/2016 10:01 AM

## 2016-04-06 ENCOUNTER — Other Ambulatory Visit: Payer: Self-pay | Admitting: Family

## 2016-04-06 DIAGNOSIS — I779 Disorder of arteries and arterioles, unspecified: Secondary | ICD-10-CM

## 2016-04-11 ENCOUNTER — Ambulatory Visit: Payer: Medicare Other | Admitting: Physical Medicine & Rehabilitation

## 2016-04-11 ENCOUNTER — Encounter: Payer: Medicare Other | Admitting: Physical Medicine & Rehabilitation

## 2016-04-12 ENCOUNTER — Encounter: Payer: Self-pay | Admitting: Physical Medicine & Rehabilitation

## 2016-04-12 ENCOUNTER — Encounter: Payer: Medicare Other | Attending: Physical Medicine & Rehabilitation | Admitting: Physical Medicine & Rehabilitation

## 2016-04-12 VITALS — BP 137/52 | HR 61 | Temp 98.3°F

## 2016-04-12 DIAGNOSIS — E1122 Type 2 diabetes mellitus with diabetic chronic kidney disease: Secondary | ICD-10-CM | POA: Diagnosis not present

## 2016-04-12 DIAGNOSIS — R269 Unspecified abnormalities of gait and mobility: Secondary | ICD-10-CM

## 2016-04-12 DIAGNOSIS — Z9889 Other specified postprocedural states: Secondary | ICD-10-CM | POA: Insufficient documentation

## 2016-04-12 DIAGNOSIS — I779 Disorder of arteries and arterioles, unspecified: Secondary | ICD-10-CM

## 2016-04-12 DIAGNOSIS — Z89611 Acquired absence of right leg above knee: Secondary | ICD-10-CM | POA: Diagnosis present

## 2016-04-12 DIAGNOSIS — N189 Chronic kidney disease, unspecified: Secondary | ICD-10-CM | POA: Insufficient documentation

## 2016-04-12 DIAGNOSIS — I272 Other secondary pulmonary hypertension: Secondary | ICD-10-CM | POA: Diagnosis not present

## 2016-04-12 DIAGNOSIS — I129 Hypertensive chronic kidney disease with stage 1 through stage 4 chronic kidney disease, or unspecified chronic kidney disease: Secondary | ICD-10-CM | POA: Diagnosis not present

## 2016-04-12 NOTE — Progress Notes (Signed)
Subjective:    Patient ID: Jessica Abbott, female    DOB: [birth year], 74 y.o.   MRN: [id number]  HPI  75 y.o. female with history of HTN, DMT2, CKD, pulmonary HTN, chronic right knee contracture, PVD presents for follow up for right AKA.   Last clinic visit 01/11/16. She has since obtained her prosthesis and is currently in therapies 2/week for gait training.  She hasn't check her CBGs recently, but was okay prior.  She continues to wear her Prevalon boot to her LLE at night.  She denies falls.  She has been following up with Vasc surgery and her PCP.    Pain Inventory Average Pain 0 Pain Right Now 0 My pain is no pain  In the last 24 hours, has pain interfered with the following? General activity 0 Relation with others 0 Enjoyment of life 0 What TIME of day is your pain at its worst? no pain Sleep (in general) NA  Pain is worse with: no pain Pain improves with: no pain Relief from Meds: no pain  Mobility use a walker use a wheelchair transfers alone  Function disabled: date disabled . I need assistance with the following:  meal prep, household duties and shopping  Neuro/Psych bladder control problems trouble walking  Prior Studies Any changes since last visit?  no  Physicians involved in your care Any changes since last visit?  no   Family History  Problem Relation Age of Onset  . Heart disease Father    Social History   Social History  . Marital status: Married    Spouse name: N/A  . Number of children: N/A  . Years of education: N/A   Social History Main Topics  . Smoking status: Former Smoker    Quit date: 10/27/2013  . Smokeless tobacco: Never Used  . Alcohol use No  . Drug use: No  . Sexual activity: Not Asked   Other Topics Concern  . None   Social History Narrative  . None   Past Surgical History:  Procedure Laterality Date  . ABDOMINAL HYSTERECTOMY    . AMPUTATION Right 12/23/2015   Procedure: RIGHT  ABOVE KNEE AMPUTATION;  Surgeon:  Angelia Mould, MD;  Location: [place];  Service: Vascular;  Laterality: Right;  . BACK SURGERY  2002  . COLONOSCOPY N/A 11/24/2015   Procedure: COLONOSCOPY;  Surgeon: Ronald Lobo, MD;  Location: [facility] ENDOSCOPY;  Service: Endoscopy;  Laterality: N/A;  . ESOPHAGOGASTRODUODENOSCOPY (EGD) WITH PROPOFOL N/A 11/23/2015   Procedure: ESOPHAGOGASTRODUODENOSCOPY (EGD) WITH PROPOFOL;  Surgeon: Ronald Lobo, MD;  Location: [facility] ENDOSCOPY;  Service: Endoscopy;  Laterality: N/A;  . femoral artery arteriogram  04/20/2015  . GIVENS CAPSULE STUDY N/A 11/26/2015   Procedure: GIVENS CAPSULE STUDY;  Surgeon: Ronald Lobo, MD;  Location: [facility] ENDOSCOPY;  Service: Endoscopy;  Laterality: N/A;   Past Medical History:  Diagnosis Date  . Anemia   . Aortic regurgitation    moderate AR by 11/25/15 echo  . Arthritis   . Chronic kidney disease   . Diabetes mellitus without complication ([place])   . History of blood transfusion   . History of bronchitis   . History of pneumonia   . Hypertension   . Peripheral arterial disease ([place])   . Peripheral vascular disease ([place])   . Pulmonary hypertension ([place])    Q000111Q echo: PA systolic pressure severely increaed, PA peak pressure 66 mm Hg.   . 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 SOB (shortness of breath)   .  Stress incontinence    BP (!) 137/52 (BP Location: Left Arm, Patient Position: Sitting, Cuff Size: Normal)   Pulse 61   Temp 98.3 F (36.8 C) (Oral)   SpO2 96%   Opioid Risk Score:   Fall Risk Score:  `1  Depression screen PHQ 2/9  Depression screen [facility] 2/9 04/12/2016 01/11/2016  Decreased Interest 0 0  Down, Depressed, Hopeless 0 0  PHQ - 2 Score 0 0  Altered sleeping - 1  Tired, decreased energy - 0  Change in appetite - 0  Feeling bad or failure about yourself  - 0  Trouble concentrating - 0  Moving slowly or fidgety/restless - 0  Suicidal thoughts - 0  PHQ-9 Score - 1    Review of Systems  Constitutional: Negative  for chills and fever.  Musculoskeletal: Positive for gait problem. Negative for back pain.  All other systems reviewed and are negative.     Objective:   Physical Exam Constitutional: She appears well-developed and well-nourished. NAD. HENT: Normocephalic and atraumatic.  Eyes: Conjunctivae are normal.  Cardiovascular: Normal rate and regular rhythm.No murmur heard. Respiratory: Effort normal and breath sounds normal. No stridor. No respiratory distress. She has no wheezes.  GI: Soft. Bowel sounds are normal. She exhibits no distension. There is no tenderness.  Musculoskeletal: She exhibits no edema and no tenderness.  LLE healed old surgical incisions.  R-AKA well shaped, healed, some limitations with extension.  Neurological: She is alert and oriented.  Motor: B/l UE 5/5 LLE hip flexion 5/5, knee extension 5/5, ankle dorsi/plantar flexion 5/5 Right hip flexion 5/5   Skin: Left heel remains boggy. Right AKA healed.   Psychiatric: Her speech is normal. Her affect is normal.    Assessment & Plan:  75 y.o. female with history of HTN, DMT2, CKD, pulmonary HTN, chronic right knee contracture, PVD presents for follow up for right AKA.   1. S/p Right AKA  Cont PT for gait training with prosthesis  Referral for stump shrinker  Cont to follow up with Vasc surgery  Cont cleaning stump site  Monitor for pressure areas with prosthesis  Educated pt on extension exercises  2. Boggy left heel  Cont pressure relief measures  Cont Prevalon boot at night  3. Abnormality of gait  Cont wheelchair  Cont gait training with prosthesis

## 2016-07-11 ENCOUNTER — Encounter: Payer: Medicare Other | Admitting: Registered Nurse

## 2016-07-12 ENCOUNTER — Ambulatory Visit: Payer: Medicare Other | Admitting: Registered Nurse

## 2016-07-13 ENCOUNTER — Ambulatory Visit: Payer: Medicare Other | Admitting: Registered Nurse

## 2016-07-27 ENCOUNTER — Encounter: Payer: Self-pay | Admitting: Vascular Surgery

## 2016-07-27 ENCOUNTER — Encounter: Payer: Self-pay | Admitting: Physical Medicine & Rehabilitation

## 2016-07-27 ENCOUNTER — Encounter: Payer: Medicare Other | Attending: Physical Medicine & Rehabilitation | Admitting: Physical Medicine & Rehabilitation

## 2016-07-27 VITALS — BP 133/63 | HR 58

## 2016-07-27 DIAGNOSIS — N393 Stress incontinence (female) (male): Secondary | ICD-10-CM | POA: Diagnosis not present

## 2016-07-27 DIAGNOSIS — I779 Disorder of arteries and arterioles, unspecified: Secondary | ICD-10-CM | POA: Diagnosis not present

## 2016-07-27 DIAGNOSIS — M1990 Unspecified osteoarthritis, unspecified site: Secondary | ICD-10-CM | POA: Insufficient documentation

## 2016-07-27 DIAGNOSIS — I272 Pulmonary hypertension, unspecified: Secondary | ICD-10-CM | POA: Insufficient documentation

## 2016-07-27 DIAGNOSIS — I739 Peripheral vascular disease, unspecified: Secondary | ICD-10-CM | POA: Diagnosis not present

## 2016-07-27 DIAGNOSIS — D649 Anemia, unspecified: Secondary | ICD-10-CM | POA: Diagnosis not present

## 2016-07-27 DIAGNOSIS — I10 Essential (primary) hypertension: Secondary | ICD-10-CM | POA: Diagnosis present

## 2016-07-27 DIAGNOSIS — M24561 Contracture, right knee: Secondary | ICD-10-CM | POA: Insufficient documentation

## 2016-07-27 DIAGNOSIS — N189 Chronic kidney disease, unspecified: Secondary | ICD-10-CM | POA: Diagnosis not present

## 2016-07-27 DIAGNOSIS — Z87891 Personal history of nicotine dependence: Secondary | ICD-10-CM | POA: Insufficient documentation

## 2016-07-27 DIAGNOSIS — I129 Hypertensive chronic kidney disease with stage 1 through stage 4 chronic kidney disease, or unspecified chronic kidney disease: Secondary | ICD-10-CM | POA: Diagnosis not present

## 2016-07-27 DIAGNOSIS — I351 Nonrheumatic aortic (valve) insufficiency: Secondary | ICD-10-CM | POA: Insufficient documentation

## 2016-07-27 DIAGNOSIS — R269 Unspecified abnormalities of gait and mobility: Secondary | ICD-10-CM | POA: Diagnosis not present

## 2016-07-27 DIAGNOSIS — Z89611 Acquired absence of right leg above knee: Secondary | ICD-10-CM | POA: Insufficient documentation

## 2016-07-27 DIAGNOSIS — E1122 Type 2 diabetes mellitus with diabetic chronic kidney disease: Secondary | ICD-10-CM | POA: Insufficient documentation

## 2016-07-27 NOTE — Progress Notes (Signed)
Subjective:    Patient ID: Jessica Abbott, female    DOB: 08/16/1941, 75 y.o.   MRN: [id number]  HPI  75 y.o. female with history of HTN, DMT2, CKD, pulmonary HTN, chronic right knee contracture, PVD presents for follow up for right AKA.    Last clinic visit 04/12/16. Since then, she is still in therapy.  She is wearing her shrinker.  She sees Vasc next week.  She has obtained her prosthesis.  She is doing better with ambulation. Denies pain.  Pain Inventory Average Pain 0 Pain Right Now 0 My pain is no pain  In the last 24 hours, has pain interfered with the following? General activity 0 Relation with others 0 Enjoyment of life 0 What TIME of day is your pain at its worst? no pain Sleep (in general) Good  Pain is worse with: no pain Pain improves with: no pain Relief from Meds: 7  Mobility use a walker how many minutes can you walk? 60 use a wheelchair  Function retired  Neuro/Psych No problems in this area  Prior Studies Any changes since last visit?  no  Physicians involved in your care Any changes since last visit?  no   Family History  Problem Relation Age of Onset  . Heart disease Father    Social History   Social History  . Marital status: Married    Spouse name: N/A  . Number of children: N/A  . Years of education: N/A   Social History Main Topics  . Smoking status: Former Smoker    Quit date: 10/27/2013  . Smokeless tobacco: Never Used  . Alcohol use No  . Drug use: No  . Sexual activity: Not Asked   Other Topics Concern  . None   Social History Narrative  . None   Past Surgical History:  Procedure Laterality Date  . ABDOMINAL HYSTERECTOMY    . AMPUTATION Right 12/23/2015   Procedure: RIGHT  ABOVE KNEE AMPUTATION;  Surgeon: Angelia Mould, MD;  Location: [place];  Service: Vascular;  Laterality: Right;  . BACK SURGERY  2002  . COLONOSCOPY N/A 11/24/2015   Procedure: COLONOSCOPY;  Surgeon: Ronald Lobo, MD;  Location: [facility]  ENDOSCOPY;  Service: Endoscopy;  Laterality: N/A;  . ESOPHAGOGASTRODUODENOSCOPY (EGD) WITH PROPOFOL N/A 11/23/2015   Procedure: ESOPHAGOGASTRODUODENOSCOPY (EGD) WITH PROPOFOL;  Surgeon: Ronald Lobo, MD;  Location[facility] END[facility]COPY;  Service: Endoscopy;  Laterality: N/A;  . femoral artery arteriogram  04/20/2015  . GIVENS CAPSULE STUDY N/A 11/26/2015   Procedure: GIVENS CAPSULE STUDY;  Surgeon: Ronald Lobo, MD;  Location: [facility] ENDOSCOPY;  Service: Endoscopy;  Laterality: N/A;   Past Medical History:  Diagnosis Date  . Anemia   . Aortic regurgitation    moderate AR by 11/25/15 echo  . Arthritis   . Chronic kidney disease   . Diabetes mellitus without complication ([place])   . History of blood transfusion   . History of bronchitis   . History of pneumonia   . Hypertension   . Peripheral arterial disease ([place])   . Peripheral vascular disease ([place])   . Pulmonary hypertension    Q000111Q echo: PA systolic pressure severely increaed, PA peak pressure 66 mm Hg.   . 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 SOB (shortness of breath)   . Stress incontinence    BP 133/63   Pulse (!) 58   SpO2 96%   Opioid Risk Score:   Fall Risk Score:  `1  Depression screen PHQ  2/9  Depression screen PHQ 2/9 04/12/2016 01/11/2016  Decreased Interest 0 0  Down, Depressed, Hopeless 0 0  PHQ - 2 Score 0 0  Altered sleeping - 1  Tired, decreased energy - 0  Change in appetite - 0  Feeling bad or failure about yourself  - 0  Trouble concentrating - 0  Moving slowly or fidgety/restless - 0  Suicidal thoughts - 0  PHQ-9 Score - 1    Review of Systems  Constitutional: Negative.  Negative for chills and fever.  HENT: Negative.   Eyes: Negative.   Respiratory: Negative.   Cardiovascular: Negative.   Gastrointestinal: Negative.   Endocrine: Negative.   Genitourinary: Negative.   Musculoskeletal: Positive for gait problem. Negative for back pain.  Skin: Negative.     Allergic/Immunologic: Negative.   Hematological: Negative.   Psychiatric/Behavioral: Negative.   All other systems reviewed and are negative.     Objective:   Physical Exam Constitutional: She appears well-developed and well-nourished. NAD. HENT: Normocephalic and atraumatic.  Eyes: EOMI. NO discharge.  Cardiovascular: Normal rate and regular rhythm.No JVD. Respiratory: Effort normal and breath sounds normal. No stridor.  GI: Soft. She exhibits no distension. There is no tenderness.  Musculoskeletal: She exhibits no edema and no tenderness.  LLE healed old surgical incisions.  R-AKA well shaped, healed Neurological: She is alert and oriented.  Motor: B/l UE 5/5 LLE hip flexion 5/5, knee extension 5/5, ankle dorsi/plantar flexion 5/5 Right hip flexion 5/5   Skin: Left heel remains boggy (unchanged).  Right AKA healed.   Psychiatric: Her speech is normal. Her affect is normal.    Assessment & Plan:  75 y.o. female with history of HTN, DMT2, CKD, pulmonary HTN, chronic right knee contracture, PVD presents for follow up for right AKA.   1. S/p Right AKA  Cont PT for gait training with prosthesis  Cont to follow up with Vasc surgery  Monitor for pressure areas with prosthesis, appears to be fitting appropriately at present  2. Boggy left heel  Cont pressure relief measures  Cont Prevalon boot at night  Encouraged pressure relief shoe due to increased pressure while gait training  3. Abnormality of gait  Cont Prothesis with walker, wheelchair otherwise  Cont gait training

## 2016-07-31 NOTE — Addendum Note (Signed)
Addended by: Mena Goes on: 07/31/2016 03:03 PM   Modules accepted: Orders

## 2016-08-01 ENCOUNTER — Encounter (HOSPITAL_COMMUNITY): Payer: Medicare Other

## 2016-08-01 ENCOUNTER — Ambulatory Visit: Payer: Medicare Other | Admitting: Vascular Surgery

## 2016-10-15 ENCOUNTER — Encounter: Payer: Self-pay | Admitting: Family

## 2016-10-17 ENCOUNTER — Encounter: Payer: Self-pay | Admitting: Family

## 2016-10-17 ENCOUNTER — Ambulatory Visit (INDEPENDENT_AMBULATORY_CARE_PROVIDER_SITE_OTHER): Payer: Medicare Other | Admitting: Family

## 2016-10-17 ENCOUNTER — Encounter (HOSPITAL_COMMUNITY): Payer: Medicare Other

## 2016-10-17 ENCOUNTER — Other Ambulatory Visit: Payer: Self-pay

## 2016-10-17 ENCOUNTER — Ambulatory Visit (HOSPITAL_COMMUNITY)
Admission: RE | Admit: 2016-10-17 | Discharge: 2016-10-17 | Disposition: A | Discharge status: Home / Self Care | Payer: Medicare Other | Source: Ambulatory Visit | Attending: Family | Admitting: Family

## 2016-10-17 ENCOUNTER — Ambulatory Visit (INDEPENDENT_AMBULATORY_CARE_PROVIDER_SITE_OTHER)
Admission: RE | Admit: 2016-10-17 | Discharge: 2016-10-17 | Disposition: A | Discharge status: Home / Self Care | Payer: Medicare Other | Source: Ambulatory Visit | Attending: Vascular Surgery | Admitting: Vascular Surgery

## 2016-10-17 VITALS — BP 160/57 | HR 65 | Temp 96.7°F | Resp 16 | Ht 62.0 in | Wt 134.0 lb

## 2016-10-17 DIAGNOSIS — I779 Disorder of arteries and arterioles, unspecified: Secondary | ICD-10-CM

## 2016-10-17 DIAGNOSIS — Z87891 Personal history of nicotine dependence: Secondary | ICD-10-CM

## 2016-10-17 DIAGNOSIS — T82858A Stenosis of vascular prosthetic devices, implants and grafts, initial encounter: Secondary | ICD-10-CM | POA: Diagnosis not present

## 2016-10-17 DIAGNOSIS — Z89611 Acquired absence of right leg above knee: Secondary | ICD-10-CM | POA: Diagnosis not present

## 2016-10-17 DIAGNOSIS — I739 Peripheral vascular disease, unspecified: Secondary | ICD-10-CM | POA: Diagnosis not present

## 2016-10-17 NOTE — Progress Notes (Signed)
[facility] [place]   CC: Follow up peripheral artery occlusive disease  History of Present Illness Jessica Abbott is a 76 y.o. female patient of Dr. 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 This was performed on 12/23/2015. She is also s/p left leg arterial bypass graft using her ipsilateral vein, performed in [place] in September 2016. Dr. Scot Dock last saw pt on 01/18/16. At that time the patient was doing well status post right above-the-knee amputation. Dr. Scot Dock wrote her a prescription for a prosthesis. Dr. Scot Dock advised pt to return in 6 months with ABIs to continue to follow her left foot closely. She was to call sooner if she had problems. Pt returns today for arterial duplex of left leg.  Pt denies pain in her left LE, has no discoloration now, no wounds. She is wearing her right AKA prosthesis, walks sometimes with a walker, is seated in her w/c now.  Pt Diabetic: Yes, in good control per pt Pt smoker: former smoker, quit in 2016, started at age 72  Pt meds include: Statin :No Betablocker: No ASA: Yes Other anticoagulants/antiplatelets: no   Past Medical History:  Diagnosis Date  . Anemia   . Aortic regurgitation    moderate AR by 11/25/15 echo  . Arthritis   . Chronic kidney disease   . Diabetes mellitus without complication ([place])   . History of blood transfusion   . History of bronchitis   . History of pneumonia   . Hypertension   . Peripheral arterial disease ([place])   . Peripheral vascular disease ([place])   . Pulmonary hypertension    Q000111Q echo: PA systolic pressure severely increaed, PA peak pressure 66 mm Hg.   . 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 SOB (shortness of breath)   . Stress incontinence     Social History Social History  Substance Use Topics  . Smoking status: Former Smoker    Quit  date: 10/27/2013  . Smokeless tobacco: Never Used  . Alcohol use No    Family History Family History  Problem Relation Age of Onset  . Heart disease Father     Past Surgical History:  Procedure Laterality Date  . ABDOMINAL HYSTERECTOMY    . AMPUTATION Right 12/23/2015   Procedure: RIGHT  ABOVE KNEE AMPUTATION;  Surgeon: Angelia Mould, MD;  Location: [place];  Service: Vascular;  Laterality: Right;  . BACK SURGERY  2002  . COLONOSCOPY N/A 11/24/2015   Procedure: COLONOSCOPY;  Surgeon: Ronald Lobo, MD;  Location: [facility] ENDOSCOPY;  Service: Endoscopy;  Laterality: N/A;  . ESOPHAGOGASTRODUODENOSCOPY (EGD) WITH PROPOFOL N/A 11/23/2015   Procedure: ESOPHAGOGASTRODUODENOSCOPY (EGD) WITH PROPOFOL;  Surgeon: Ronald Lobo, MD;  Location: [facility] ENDOSCOPY;  Service: Endoscopy;  Laterality: N/A;  . femoral artery arteriogram  04/20/2015  . GIVENS CAPSULE STUDY N/A 11/26/2015   Procedure: GIVENS CAPSULE STUDY;  Surgeon: Ronald Lobo, MD;  Location: [facility] ENDOSCOPY;  Service: Endoscopy;  Laterality: N/A;    Allergies  Allergen Reactions  . Gabapentin Other (See Comments)    Per patient, 300mg  dose made her extremely weak and unable to move. Ok with lower doses.  . Gabapentin (Once-Daily) Other (See Comments)    Per patient, 300mg  dose made her extremely weak and unable to move. Ok with lower doses.  . Other Other (See Comments)    Per patient, 300mg  dose made her extremely weak and  unable to move. Ok with lower doses.    Current Outpatient Prescriptions  Medication Sig Dispense Refill  . acetaminophen (TYLENOL) 500 MG tablet Take 500 mg by mouth every 6 (six) hours as needed (pain).    Marland Kitchen albuterol (PROVENTIL HFA;VENTOLIN HFA) 108 (90 Base) MCG/ACT inhaler Inhale 2 puffs into the lungs every 6 (six) hours as needed for wheezing or shortness of breath.    Marland Kitchen amLODipine (NORVASC) 5 MG tablet Take 5 mg by mouth daily.    Marland Kitchen aspirin EC 81 MG tablet Take 81 mg by mouth daily.     . baclofen (LIORESAL)  10 MG tablet Take 0.5 tablets (5 mg total) by mouth 4 (four) times daily. 60 each 0  . Cholecalciferol (VITAMIN D3) 2000 units capsule Take 2,000 Units by mouth daily.     Marland Kitchen docusate sodium (COLACE) 100 MG capsule Take 100 mg by mouth daily as needed (constipation).    . ferrous sulfate 325 (65 FE) MG tablet Take 1 tablet (325 mg total) by mouth 2 (two) times daily with a meal. 60 tablet 1  . lactose free nutrition (BOOST) LIQD Take 237 mLs by mouth daily as needed (occasionally).     Marland Kitchen lisinopril (PRINIVIL,ZESTRIL) 10 MG tablet Take 10 mg by mouth daily.    . metFORMIN (GLUCOPHAGE) 500 MG tablet Take 500 mg by mouth daily with breakfast. Reported on 03/14/2016    . pantoprazole (PROTONIX) 40 MG tablet Take 1 tablet (40 mg total) by mouth 2 (two) times daily. 60 tablet 0   No current facility-administered medications for this visit.     ROS: See HPI for pertinent positives and negatives.   Physical Examination  Vitals:   10/17/16 1238  BP: (!) 160/57  Pulse: 65  Resp: 16  Temp: (!) 96.7 F (35.9 C)  TempSrc: Oral  SpO2: 98%  Weight: 134 lb (60.8 kg)  Height: 5\' 2"  (1.575 m)   Body mass index is 24.51 kg/m.  General: A&O x 3, WDWN Gait: seated in w/c Eyes: PERRLA. Pulmonary: Respirations are non labored Cardiac: regular rythm   VASCULAR EXAM: Extremities without ischemic changes, without Gangrene; without open wounds. Right AKA with prosthesis in place.      LE Pulses Right Left   FEMORAL not palpable, seated in w/c  palpable    POPLITEAL AKA  1+ palpable   POSTERIOR TIBIAL AKA  not palpable    DORSALIS PEDIS  ANTERIOR TIBIAL AKA  2+ palpable    Abdomen: soft, NT, no palpable masses. Skin: no rashes, no ulcers. Musculoskeletal: no muscle wasting or atrophy,  see Extremities. Neurologic: A&O X 3; Appropriate Affect , sensation is normal; MOTOR FUNCTION: moving all extremities equally, motor strength 5/5 throughout. Speech is fluent/normal. CN 2-12 intact.     ASSESSMENT: Jessica Abbott is a 76 y.o. female who is s/p right above-the-knee amputation on 12/23/15 for gangrene of her right leg. She is also s/p left leg arterial bypass graft using her ipsilateral vein, performed in [place] in September 2016.  She currently has no pain in her left leg, no signs of ischemia in the left LE and right AKA stump. She is wearing her right AKA prosthesis.   Her atherosclerotic risk factors include good control of her DM currently and 52 years of smoking, quit in 2016.    DATA  Today's left LE arterial duplex demonstrates no focal narrowing within the left bypass graft with branches visualized throughout which may be resulting in competing flow distally. No  plaque visualized in the proximal anastomosis (increased velocity of 419 cm/s today, compared to 255 cm/s at the same location on 04-04-16). Branch also noted with diameter change of vessel proximal to anastomosis which may account for increased velocity. Monophasic waveforms throughout.   ABI's: Right: AKA Left: 0.65 (improved from 0.57 on 04-04-16), biphasic DP, absent PT; TBI: 0.27 (improved from 0.14).   Last serum creatinine result on file was 0.63 on 01-04-16.    PLAN:  Based on the patient's vascular studies and examination, and after discussing with Dr. Scot Dock, pt will be scheduled for an arteriogram with run off, possible intervention to left LE, by Dr. Scot Dock on 10-22-16.   I discussed in depth with the patient the nature of atherosclerosis, and emphasized the importance of maximal medical management including strict control of blood pressure, blood glucose, and lipid levels, obtaining regular exercise, and continued cessation of smoking.  The patient is aware that without maximal  medical management the underlying atherosclerotic disease process will progress, limiting the benefit of any interventions.  The patient was given information about PAD including signs, symptoms, treatment, what symptoms should prompt the patient to seek immediate medical care, and risk reduction measures to take.  Clemon Chambers, RN, MSN, FNP-C [facility] Phone: [phone]  Clinic MD: Scot Dock  10/17/16 12:48 PM

## 2016-10-17 NOTE — Patient Instructions (Addendum)
Peripheral Vascular Disease Peripheral vascular disease (PVD) is a disease of the blood vessels that are not part of your heart and brain. A simple term for PVD is poor circulation. In most cases, PVD narrows the blood vessels that carry blood from your heart to the rest of your body. This can result in a decreased supply of blood to your arms, legs, and internal organs, like your stomach or kidneys. However, it most often affects a person's lower legs and feet. There are two types of PVD.  Organic PVD. This is the more common type. It is caused by damage to the structure of blood vessels.  Functional PVD. This is caused by conditions that make blood vessels contract and tighten (spasm). Without treatment, PVD tends to get worse over time. PVD can also lead to acute ischemic limb. This is when an arm or limb suddenly has trouble getting enough blood. This is a medical emergency. Follow these instructions at home:  Take medicines only as told by your doctor.  Do not use any tobacco products, including cigarettes, chewing tobacco, or electronic cigarettes. If you need help quitting, ask your doctor.  Lose weight if you are overweight, and maintain a healthy weight as told by your doctor.  Eat a diet that is low in fat and cholesterol. If you need help, ask your doctor.  Exercise regularly. Ask your doctor for some good activities for you.  Take good care of your feet.  Wear comfortable shoes that fit well.  Check your feet often for any cuts or sores. Contact a doctor if:  You have cramps in your legs while walking.  You have leg pain when you are at rest.  You have coldness in a leg or foot.  Your skin changes.  You are unable to get or have an erection (erectile dysfunction).  You have cuts or sores on your feet that are not healing. Get help right away if:  Your arm or leg turns cold and blue.  Your arms or legs become red, warm, swollen, painful, or numb.  You have  chest pain or trouble breathing.  You suddenly have weakness in your face, arm, or leg.  You become very confused or you cannot speak.  You suddenly have a very bad headache.  You suddenly cannot see. This information is not intended to replace advice given to you by your health care provider. Make sure you discuss any questions you have with your health care provider. Document Released: 11/28/2009 Document Revised: 02/09/2016 Document Reviewed: 02/11/2014 Elsevier Interactive Patient Education  2017 [place] An angiogram is an X-ray test. It is used to look at your blood vessels. For this test, a dye is put into the blood vessel being checked. The dye shows up on X-rays. It helps your doctor see if there is a blockage or other problem in the blood vessel. What happens before the procedure?  Follow your doctor's instructions about limiting what you eat or drink.  Ask your doctor if you may drink enough water to take any needed medicines the morning of the test.  Plan to have someone take you home after the test.  If you go home the same day as the test, plan to have someone stay with you for 24 hours. What happens during the procedure?  An IV tube will be put into one of your veins.  You will be given a medicine that makes you relax (sedative).  Your skin will  be washed where the thin tube (catheter) will be put in. Hair may be removed from this area. The tube may be put into:  Your upper leg area (groin).  The fold of your arm, near your elbow.  Your wrist.  You will be given a medicine that numbs the area where the tube will be inserted (local anesthetic).  The tube will be inserted into a blood vessel.  Using a type of X-ray (fluoroscopy) to see, your doctor will move the tube into the blood vessel to check it.  Dye will be put in through the tube. X-rays of your blood vessels will then be taken. Different doctors and hospitals may do this  procedure differently. What happens after the procedure?  If the test is done through the leg, you will be kept in bed lying flat for several hours. You will be told to not bend or cross your legs.  The area where the tube was inserted will be checked often.  The pulse in your feet or wrist will be checked often.  More tests or X-rays may be done. This information is not intended to replace advice given to you by your health care provider. Make sure you discuss any questions you have with your health care provider. Document Released: 11/30/2008 Document Revised: 02/09/2016 Document Reviewed: 02/04/2013 Elsevier Interactive Patient Education  2017 [employer].

## 2016-10-22 ENCOUNTER — Encounter (HOSPITAL_COMMUNITY)
Admission: RE | Disposition: A | Discharge status: Home / Self Care | Payer: Self-pay | Source: Ambulatory Visit | Attending: Vascular Surgery

## 2016-10-22 ENCOUNTER — Ambulatory Visit (HOSPITAL_COMMUNITY)
Admission: RE | Admit: 2016-10-22 | Discharge: 2016-10-22 | Disposition: A | Discharge status: Home / Self Care | Payer: Medicare Other | Source: Ambulatory Visit | Attending: Vascular Surgery | Admitting: Vascular Surgery

## 2016-10-22 DIAGNOSIS — Z7984 Long term (current) use of oral hypoglycemic drugs: Secondary | ICD-10-CM | POA: Diagnosis not present

## 2016-10-22 DIAGNOSIS — I272 Pulmonary hypertension, unspecified: Secondary | ICD-10-CM | POA: Insufficient documentation

## 2016-10-22 DIAGNOSIS — D631 Anemia in chronic kidney disease: Secondary | ICD-10-CM | POA: Diagnosis not present

## 2016-10-22 DIAGNOSIS — I129 Hypertensive chronic kidney disease with stage 1 through stage 4 chronic kidney disease, or unspecified chronic kidney disease: Secondary | ICD-10-CM | POA: Diagnosis not present

## 2016-10-22 DIAGNOSIS — M1990 Unspecified osteoarthritis, unspecified site: Secondary | ICD-10-CM | POA: Insufficient documentation

## 2016-10-22 DIAGNOSIS — Z7982 Long term (current) use of aspirin: Secondary | ICD-10-CM | POA: Insufficient documentation

## 2016-10-22 DIAGNOSIS — E1122 Type 2 diabetes mellitus with diabetic chronic kidney disease: Secondary | ICD-10-CM | POA: Insufficient documentation

## 2016-10-22 DIAGNOSIS — I770 Arteriovenous fistula, acquired: Secondary | ICD-10-CM | POA: Insufficient documentation

## 2016-10-22 DIAGNOSIS — N393 Stress incontinence (female) (male): Secondary | ICD-10-CM | POA: Insufficient documentation

## 2016-10-22 DIAGNOSIS — Z8249 Family history of ischemic heart disease and other diseases of the circulatory system: Secondary | ICD-10-CM | POA: Insufficient documentation

## 2016-10-22 DIAGNOSIS — I739 Peripheral vascular disease, unspecified: Secondary | ICD-10-CM | POA: Diagnosis present

## 2016-10-22 DIAGNOSIS — Z89611 Acquired absence of right leg above knee: Secondary | ICD-10-CM | POA: Insufficient documentation

## 2016-10-22 DIAGNOSIS — Z87891 Personal history of nicotine dependence: Secondary | ICD-10-CM | POA: Diagnosis not present

## 2016-10-22 DIAGNOSIS — I70302 Unspecified atherosclerosis of unspecified type of bypass graft(s) of the extremities, left leg: Secondary | ICD-10-CM | POA: Insufficient documentation

## 2016-10-22 DIAGNOSIS — E1151 Type 2 diabetes mellitus with diabetic peripheral angiopathy without gangrene: Secondary | ICD-10-CM | POA: Insufficient documentation

## 2016-10-22 DIAGNOSIS — T82858A Stenosis of vascular prosthetic devices, implants and grafts, initial encounter: Secondary | ICD-10-CM | POA: Diagnosis not present

## 2016-10-22 DIAGNOSIS — I779 Disorder of arteries and arterioles, unspecified: Secondary | ICD-10-CM | POA: Diagnosis not present

## 2016-10-22 DIAGNOSIS — N189 Chronic kidney disease, unspecified: Secondary | ICD-10-CM | POA: Insufficient documentation

## 2016-10-22 DIAGNOSIS — I701 Atherosclerosis of renal artery: Secondary | ICD-10-CM | POA: Diagnosis not present

## 2016-10-22 HISTORY — PX: ABDOMINAL AORTOGRAM W/LOWER EXTREMITY: CATH118223

## 2016-10-22 LAB — POCT I-STAT, CHEM 8
BUN: 11 mg/dL (ref 6–20)
CALCIUM ION: 1.24 mmol/L (ref 1.15–1.40)
CHLORIDE: 105 mmol/L (ref 101–111)
Creatinine, Ser: 0.7 mg/dL (ref 0.44–1.00)
GLUCOSE: 133 mg/dL — AB (ref 65–99)
HCT: 40 % (ref 36.0–46.0)
Hemoglobin: 13.6 g/dL (ref 12.0–15.0)
Potassium: 3.9 mmol/L (ref 3.5–5.1)
SODIUM: 142 mmol/L (ref 135–145)
TCO2: 27 mmol/L (ref 0–100)

## 2016-10-22 LAB — GLUCOSE, CAPILLARY: GLUCOSE-CAPILLARY: 116 mg/dL — AB (ref 65–99)

## 2016-10-22 SURGERY — ABDOMINAL AORTOGRAM W/LOWER EXTREMITY
Anesthesia: LOCAL

## 2016-10-22 MED ORDER — SODIUM CHLORIDE 0.9 % IV SOLN
INTRAVENOUS | Status: DC
  Administered 2016-10-22: 08:00:00 via INTRAVENOUS

## 2016-10-22 MED ORDER — IODIXANOL 320 MG/ML IV SOLN
INTRAVENOUS | Status: DC | PRN
  Administered 2016-10-22: 105 mL via INTRA_ARTERIAL

## 2016-10-22 MED ORDER — HYDRALAZINE HCL 20 MG/ML IJ SOLN: 10.0000 mg | INTRAMUSCULAR | Status: DC | PRN

## 2016-10-22 MED ORDER — FENTANYL CITRATE (PF) 100 MCG/2ML IJ SOLN
INTRAMUSCULAR | Status: DC | PRN
  Administered 2016-10-22: 50 ug via INTRAVENOUS

## 2016-10-22 MED ORDER — MIDAZOLAM HCL 2 MG/2ML IJ SOLN
INTRAMUSCULAR | Status: DC | PRN
  Administered 2016-10-22: 1 mg via INTRAVENOUS

## 2016-10-22 MED ORDER — FENTANYL CITRATE (PF) 100 MCG/2ML IJ SOLN: 25.0000 ug | Freq: Once | INTRAMUSCULAR | Status: DC

## 2016-10-22 MED ORDER — FENTANYL CITRATE (PF) 100 MCG/2ML IJ SOLN
INTRAMUSCULAR | Status: AC
  Filled 2016-10-22: qty 2

## 2016-10-22 MED ORDER — MIDAZOLAM HCL 2 MG/2ML IJ SOLN: 1.0000 mg | Freq: Once | INTRAMUSCULAR | Status: DC

## 2016-10-22 MED ORDER — MIDAZOLAM HCL 2 MG/2ML IJ SOLN
INTRAMUSCULAR | Status: AC
  Filled 2016-10-22: qty 2

## 2016-10-22 MED ORDER — SODIUM CHLORIDE 0.9 % IV SOLN: 1.0000 mL/kg/h | INTRAVENOUS | Status: DC

## 2016-10-22 MED ORDER — HEPARIN (PORCINE) IN NACL 2-0.9 UNIT/ML-% IJ SOLN
INTRAMUSCULAR | Status: DC | PRN
  Administered 2016-10-22: 1000 mL

## 2016-10-22 MED ORDER — HEPARIN (PORCINE) IN NACL 2-0.9 UNIT/ML-% IJ SOLN
INTRAMUSCULAR | Status: AC
  Filled 2016-10-22: qty 1000

## 2016-10-22 MED ORDER — LIDOCAINE HCL (PF) 1 % IJ SOLN
INTRAMUSCULAR | Status: AC
  Filled 2016-10-22: qty 30

## 2016-10-22 MED ORDER — LIDOCAINE HCL (PF) 1 % IJ SOLN
INTRAMUSCULAR | Status: DC | PRN
  Administered 2016-10-22: 15 mL

## 2016-10-22 SURGICAL SUPPLY — 15 items
CATH ANGIO 5F PIGTAIL 65CM (CATHETERS) ×2 IMPLANT
CATH CROSS OVER TEMPO 5F (CATHETERS) ×1 IMPLANT
CATH SOFT-VU 4F 65 STRAIGHT (CATHETERS) IMPLANT
CATH SOFT-VU STRAIGHT 4F 65CM (CATHETERS) ×2
COVER PRB 48X5XTLSCP FOLD TPE (BAG) ×1 IMPLANT
COVER PROBE 5X48 (BAG) ×2
GUIDEWIRE ANGLED .035X150CM (WIRE) ×1 IMPLANT
KIT PV (KITS) ×2 IMPLANT
SHEATH PINNACLE 5F 10CM (SHEATH) ×1 IMPLANT
SYR MEDRAD MARK V 150ML (SYRINGE) ×2 IMPLANT
TRANSDUCER W/STOPCOCK (MISCELLANEOUS) ×2 IMPLANT
TRAY PV CATH (CUSTOM PROCEDURE TRAY) ×2 IMPLANT
TUBING CIL FLEX 10 FLL-RA (TUBING) ×1 IMPLANT
WIRE HITORQ VERSACORE ST 145CM (WIRE) ×1 IMPLANT
WIRE MINI STICK MAX (SHEATH) ×1 IMPLANT

## 2016-10-22 NOTE — Interval H&P Note (Signed)
History and Physical Interval Note:  10/22/2016 8:43 AM  Jessica Abbott  has presented today for surgery, with the diagnosis of pad  The various methods of treatment have been discussed with the patient and family. After consideration of risks, benefits and other options for treatment, the patient has consented to  Procedure(s): Abdominal Aortogram w/Lower Extremity (N/A) as a surgical intervention .  [name] history has been reviewed, patient examined, no change in status, stable for surgery.  I have reviewed the patient's chart and labs.  Questions were answered to the patient's satisfaction.     [name]

## 2016-10-22 NOTE — Op Note (Signed)
   PATIENT: Jessica Abbott   MRN: [id number] DOB: 04/06/1941    DATE OF PROCEDURE: 10/22/2016  INDICATIONS: Jessica Abbott is a 76 y.o. female who I performed a right above-the-knee amputation on. The patient has had a previous left femoral to dorsalis pedis bypass done elsewhere. On a follow up duplex scan the patient was noted to have some competing AV fistulas. The patient was set up for an arteriogram.  PROCEDURE:  1. Ultrasound-guided access to the right common femoral artery 2. Aortogram with bilateral iliac arteriogram 3. Selective catheterization of the left external iliac artery with left lower extremity runoff 4. Selective catheterization of the left femoral-tibial bypass graft  SURGEON: Judeth Cornfield. Scot Dock, MD, FACS  ANESTHESIA: Local with sedation   EBL: Minimal  TECHNIQUE: The patient was taken to the operating room and was sedated. The total time of sedation was 49 minutes. During that time period, I was present face-to-face 100% of the time.  The patient was administered (1 mg of Versed and 50 g of fentanyl.). [name] heart rate, blood pressure, and oxygen saturation were monitored by the nurse continuously during the procedure.  Both groins were prepped and draped in usual sterile fashion. In order to avoid the bypass graft in the left groin I decided to cannulate the right side despite her contracture in the right AKA. Under ultrasound guidance, after the skin was anesthetized, I was able to cannulate the right common femoral artery with a micropunch needle and micropuncture wire was introduced. The micro-puncture sheath was then introduced and this was exchanged for a 5 French sheath over a versa core wire. Pigtail catheter was positioned at the L1 vertebral body and flush aortogram obtained. The cath was in position above the aortic bifurcation and exchanged for a crossover catheter. An angled Glidewire was advanced down into the common femoral artery in the crossover  catheter exchanged for a straight catheter. Selective left external iliac arterial grams obtained with left lower extremity runoff. 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 Next using staged films were obtained to evaluate the graft and fistulas. At the completion the catheter was removed and the patient was transferred to the holding area for removal of the sheath. No immediate complications were noted.   FINDINGS:  1. The right renal artery was superior and was not identified. The left renal artery had a 30% stenosis. The infrarenal aorta bilateral common iliac arteries were widely patent. 2. On the left side, the common femoral and deep femoral artery are patent. The superficial femoral artery is patent proximally, but occludes in the thigh.. The left femoral to dorsalis pedis artery bypass graft is widely patent. The tibials are all occluded on the left. 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  CLINICAL NOTE: The patient will be evaluated in the office for possible ligation of the fistula of her left femoral dorsalis pedis bypass patent graft.  [name], MD, FACS [facility]  DATE OF DICTATION:   10/22/2016

## 2016-10-22 NOTE — Discharge Instructions (Signed)

## 2016-10-22 NOTE — Progress Notes (Signed)
Site area: rt groin Site Prior to Removal:  Level 0 Pressure Applied For:  20 minutes Manual:  yes  Patient Status During Pull:  stable Post Pull Site:  Level  0  Post Pull Instructions Given:  yes Post Pull Pulses Present: rt aka Dressing Applied:  yes Bedrest begins @ 1025 Comments:

## 2016-10-22 NOTE — H&P (View-Only) (Signed)
[facility]   CC: Follow up peripheral artery occlusive disease  History of Present Illness Jessica Abbott is a 76 y.o. 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 This was performed on 12/23/2015. She is also s/p left leg arterial bypass graft using her ipsilateral vein, performed in [employer] in September 2016. Jessica Abbott last saw pt on 01/18/16. At that time the patient was doing well status post right above-the-knee amputation. Jessica Abbott wrote her a prescription for a prosthesis. Jessica Abbott advised pt to return in 6 months with ABIs to continue to follow her left foot closely. She was to call sooner if she had problems. Pt returns today for arterial duplex of left leg.  Pt denies pain in her left LE, has no discoloration now, no wounds. She is wearing her right AKA prosthesis, walks sometimes with a walker, is seated in her w/c now.  Pt Diabetic: Yes, in good control per pt Pt smoker: former smoker, quit in 2016, started at age 45  Pt meds include: Statin :No Betablocker: No ASA: Yes Other anticoagulants/antiplatelets: no   Past Medical History:  Diagnosis Date  . Anemia   . Aortic regurgitation    moderate AR by 11/25/15 echo  . Arthritis   . Chronic kidney disease   . Diabetes mellitus without complication ([place])   . History of blood transfusion   . History of bronchitis   . History of pneumonia   . Hypertension   . Peripheral arterial disease ([place])   . Peripheral vascular disease ([place])   . Pulmonary hypertension    Q000111Q echo: PA systolic pressure severely increaed, PA peak pressure 66 mm Hg.   . 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 SOB (shortness of breath)   . Stress incontinence     Social History Social History  Substance Use Topics  . Smoking status: Former Smoker    Quit  date: 10/27/2013  . Smokeless tobacco: Never Used  . Alcohol use No    Family History Family History  Problem Relation Age of Onset  . Heart disease Father     Past Surgical History:  Procedure Laterality Date  . ABDOMINAL HYSTERECTOMY    . AMPUTATION Right 12/23/2015   Procedure: RIGHT  ABOVE KNEE AMPUTATION;  Surgeon: Angelia Mould, MD;  Location: [facility];  Service: Vascular;  Laterality: Right;  . BACK SURGERY  2002  . COLONOSCOPY N/A 11/24/2015   Procedure: COLONOSCOPY;  Surgeon: Jessica Lobo, MD;  [facility] END[facility]COPY;  Service: Endoscopy;  Laterality: N/A;  . ESOPHAGOGASTRODUODENOSCOPY (EGD) WITH PROPOFOL N/A 11/23/2015   Procedure: ESOPHAGOGASTRODUODENOSCOPY (EGD) WITH PROPOFOL;  Surgeon: Jessica Lobo, MD;  Location: [facility] ENDOSCOPY;  Service: Endoscopy;  Laterality: N/A;  . femoral artery arteriogram  04/20/2015  . GIVENS CAPSULE STUDY N/A 11/26/2015   Procedure: GIVENS CAPSULE STUDY;  Surgeon: Jessica Lobo, MD;  Location: [facility] ENDOSCOPY;  Service: Endoscopy;  Laterality: N/A;    Allergies  Allergen Reactions  . Gabapentin Other (See Comments)    Per patient, 300mg  dose made her extremely weak and unable to move. Ok with lower doses.  . Gabapentin (Once-Daily) Other (See Comments)    Per patient, 300mg  dose made her extremely weak and unable to move. Ok with lower doses.  . Other Other (See Comments)    Per patient, 300mg  dose made her extremely weak and  unable to move. Ok with lower doses.    Current Outpatient Prescriptions  Medication Sig Dispense Refill  . acetaminophen (TYLENOL) 500 MG tablet Take 500 mg by mouth every 6 (six) hours as needed (pain).    Marland Kitchen albuterol (PROVENTIL HFA;VENTOLIN HFA) 108 (90 Base) MCG/ACT inhaler Inhale 2 puffs into the lungs every 6 (six) hours as needed for wheezing or shortness of breath.    Marland Kitchen amLODipine (NORVASC) 5 MG tablet Take 5 mg by mouth daily.    Marland Kitchen aspirin EC 81 MG tablet Take 81 mg by mouth daily.     . baclofen (LIORESAL)  10 MG tablet Take 0.5 tablets (5 mg total) by mouth 4 (four) times daily. 60 each 0  . Cholecalciferol (VITAMIN D3) 2000 units capsule Take 2,000 Units by mouth daily.     Marland Kitchen docusate sodium (COLACE) 100 MG capsule Take 100 mg by mouth daily as needed (constipation).    . ferrous sulfate 325 (65 FE) MG tablet Take 1 tablet (325 mg total) by mouth 2 (two) times daily with a meal. 60 tablet 1  . lactose free nutrition (BOOST) LIQD Take 237 mLs by mouth daily as needed (occasionally).     Marland Kitchen lisinopril (PRINIVIL,ZESTRIL) 10 MG tablet Take 10 mg by mouth daily.    . metFORMIN (GLUCOPHAGE) 500 MG tablet Take 500 mg by mouth daily with breakfast. Reported on 03/14/2016    . pantoprazole (PROTONIX) 40 MG tablet Take 1 tablet (40 mg total) by mouth 2 (two) times daily. 60 tablet 0   No current facility-administered medications for this visit.     ROS: See HPI for pertinent positives and negatives.   Physical Examination  Vitals:   10/17/16 1238  BP: (!) 160/57  Pulse: 65  Resp: 16  Temp: (!) 96.7 F (35.9 C)  TempSrc: Oral  SpO2: 98%  Weight: 134 lb (60.8 kg)  Height: 5\' 2"  (1.575 m)   Body mass index is 24.51 kg/m.  General: A&O x 3, WDWN Gait: seated in w/c Eyes: PERRLA. Pulmonary: Respirations are non labored Cardiac: regular rythm   VASCULAR EXAM: Extremities without ischemic changes, without Gangrene; without open wounds. Right AKA with prosthesis in place.      LE Pulses Right Left   FEMORAL not palpable, seated in w/c  palpable    POPLITEAL AKA  1+ palpable   POSTERIOR TIBIAL AKA  not palpable    DORSALIS PEDIS  ANTERIOR TIBIAL AKA  2+ palpable    Abdomen: soft, NT, no palpable masses. Skin: no rashes, no ulcers. Musculoskeletal: no muscle wasting or atrophy,  see Extremities. Neurologic: A&O X 3; Appropriate Affect , sensation is normal; MOTOR FUNCTION: moving all extremities equally, motor strength 5/5 throughout. Speech is fluent/normal. CN 2-12 intact.     ASSESSMENT: [name] is a 76 y.o. female who is s/p right above-the-knee amputation on 12/23/15 for gangrene of her right leg. She is also s/p left leg arterial bypass graft using her ipsilateral vein, performed in [place] in September 2016.  She currently has no pain in her left leg, no signs of ischemia in the left LE and right AKA stump. She is wearing her right AKA prosthesis.   Her atherosclerotic risk factors include good control of her DM currently and 52 years of smoking, quit in 2016.    DATA  Today's left LE arterial duplex demonstrates no focal narrowing within the left bypass graft with branches visualized throughout which may be resulting in competing flow distally. No  plaque visualized in the proximal anastomosis (increased velocity of 419 cm/s today, compared to 255 cm/s at the same location on 04-04-16). Branch also noted with diameter change of vessel proximal to anastomosis which may account for increased velocity. Monophasic waveforms throughout.   ABI's: Right: AKA Left: 0.65 (improved from 0.57 on 04-04-16), biphasic DP, absent PT; TBI: 0.27 (improved from 0.14).   Last serum creatinine result on file was 0.63 on 01-04-16.    PLAN:  Based on the patient's vascular studies and examination, and after discussing with Jessica Abbott, pt will be scheduled for an arteriogram with run off, possible intervention to left LE, by Jessica Abbott on 10-22-16.   I discussed in depth with the patient the nature of atherosclerosis, and emphasized the importance of maximal medical management including strict control of blood pressure, blood glucose, and lipid levels, obtaining regular exercise, and continued cessation of smoking.  The patient is aware that without maximal  medical management the underlying atherosclerotic disease process will progress, limiting the benefit of any interventions.  The patient was given information about PAD including signs, symptoms, treatment, what symptoms should prompt the patient to seek immediate medical care, and risk reduction measures to take.  Clemon Chambers, RN, MSN, FNP-C [facility] Phone: [phone]  Clinic MD: Scot Abbott  10/17/16 12:48 PM

## 2016-10-23 ENCOUNTER — Encounter (HOSPITAL_COMMUNITY): Payer: Self-pay | Admitting: Vascular Surgery

## 2016-10-26 ENCOUNTER — Encounter: Payer: Self-pay | Admitting: Registered Nurse

## 2016-10-26 ENCOUNTER — Encounter: Payer: Medicare Other | Attending: Physical Medicine & Rehabilitation | Admitting: Registered Nurse

## 2016-10-26 VITALS — BP 152/62 | HR 64 | Resp 14

## 2016-10-26 DIAGNOSIS — I272 Pulmonary hypertension, unspecified: Secondary | ICD-10-CM | POA: Diagnosis not present

## 2016-10-26 DIAGNOSIS — I351 Nonrheumatic aortic (valve) insufficiency: Secondary | ICD-10-CM | POA: Insufficient documentation

## 2016-10-26 DIAGNOSIS — R269 Unspecified abnormalities of gait and mobility: Secondary | ICD-10-CM | POA: Diagnosis not present

## 2016-10-26 DIAGNOSIS — D649 Anemia, unspecified: Secondary | ICD-10-CM | POA: Insufficient documentation

## 2016-10-26 DIAGNOSIS — E118 Type 2 diabetes mellitus with unspecified complications: Secondary | ICD-10-CM

## 2016-10-26 DIAGNOSIS — M24561 Contracture, right knee: Secondary | ICD-10-CM | POA: Diagnosis not present

## 2016-10-26 DIAGNOSIS — I10 Essential (primary) hypertension: Secondary | ICD-10-CM | POA: Diagnosis present

## 2016-10-26 DIAGNOSIS — I129 Hypertensive chronic kidney disease with stage 1 through stage 4 chronic kidney disease, or unspecified chronic kidney disease: Secondary | ICD-10-CM | POA: Diagnosis not present

## 2016-10-26 DIAGNOSIS — Z89611 Acquired absence of right leg above knee: Secondary | ICD-10-CM | POA: Insufficient documentation

## 2016-10-26 DIAGNOSIS — N393 Stress incontinence (female) (male): Secondary | ICD-10-CM | POA: Diagnosis not present

## 2016-10-26 DIAGNOSIS — E1122 Type 2 diabetes mellitus with diabetic chronic kidney disease: Secondary | ICD-10-CM | POA: Diagnosis not present

## 2016-10-26 DIAGNOSIS — N189 Chronic kidney disease, unspecified: Secondary | ICD-10-CM | POA: Insufficient documentation

## 2016-10-26 DIAGNOSIS — I779 Disorder of arteries and arterioles, unspecified: Secondary | ICD-10-CM | POA: Diagnosis not present

## 2016-10-26 DIAGNOSIS — M1990 Unspecified osteoarthritis, unspecified site: Secondary | ICD-10-CM | POA: Insufficient documentation

## 2016-10-26 DIAGNOSIS — I739 Peripheral vascular disease, unspecified: Secondary | ICD-10-CM | POA: Insufficient documentation

## 2016-10-26 DIAGNOSIS — Z87891 Personal history of nicotine dependence: Secondary | ICD-10-CM | POA: Diagnosis not present

## 2016-10-26 NOTE — Progress Notes (Signed)
Subjective:    Patient ID: Jessica Abbott, female    DOB: December 11, 1940, 76 y.o.   MRN: [id number]  HPI: Ms. Jessica Abbott is a 76 year old female who returns for follow up appointment S/P Right AKA. She denies any pain at this time. Current exercise regime is walking with her walker in her home with prosthesis, arrived in wheelchair. Ms. Jessica Abbott walked in hallway with walker wide based gait, Dr. Posey Pronto assessed Ms. Jessica Abbott gait with prosthesis,  Daughter in room all questions answered.    Pain Inventory Average Pain 0 Pain Right Now 0 My pain is no pain  In the last 24 hours, has pain interfered with the following? General activity 0 Relation with others 0 Enjoyment of life 0 What TIME of day is your pain at its worst? no pain Sleep (in general) no pain  Pain is worse with: no pain Pain improves with: no pain Relief from Meds: 0  Mobility walk with assistance how many minutes can you walk? 60 use a wheelchair  Function retired  Neuro/Psych bladder control problems  Prior Studies Any changes since last visit?  no  Physicians involved in your care Any changes since last visit?  no   Family History  Problem Relation Age of Onset  . Heart disease Father    Social History   Social History  . Marital status: Married    Spouse name: N/A  . Number of children: N/A  . Years of education: N/A   Social History Main Topics  . Smoking status: Former Smoker    Quit date: 10/27/2013  . Smokeless tobacco: Never Used  . Alcohol use No  . Drug use: No  . Sexual activity: Not Asked   Other Topics Concern  . None   Social History Narrative  . None   Past Surgical History:  Procedure Laterality Date  . ABDOMINAL AORTOGRAM W/LOWER EXTREMITY N/A 10/22/2016   Procedure: Abdominal Aortogram w/Lower Extremity;  Surgeon: Angelia Mould, MD;  Location: [place] [facility];  Service: Cardiovascular;  Laterality: N/A;  lt leg  . ABDOMINAL HYSTERECTOMY    . AMPUTATION  Right 12/23/2015   Procedure: RIGHT  ABOVE KNEE AMPUTATION;  Surgeon: Angelia Mould, MD;  Location: [place];  Service: Vascular;  Laterality: Right;  . BACK SURGERY  2002  . COLONOSCOPY N/A 11/24/2015   Procedure: COLONOSCOPY;  Surgeon: Ronald Lobo, MD;  Location: [facility] ENDOSCOPY;  Service: Endoscopy;  Laterality: N/A;  . ESOPHAGOGASTRODUODENOSCOPY (EGD) WITH PROPOFOL N/A 11/23/2015   Procedure: ESOPHAGOGASTRODUODENOSCOPY (EGD) WITH PROPOFOL;  Surgeon: Ronald Lobo, MD;  Location: [facility] ENDOSCOPY;  Service: Endoscopy;  Laterality: N/A;  . femoral artery arteriogram  04/20/2015  . GIVENS CAPSULE STUDY N/A 11/26/2015   Procedure: GIVENS CAPSULE STUDY;  Surgeon: Ronald Lobo, MD;  Location: [facility] ENDOSCOPY;  Service: Endoscopy;  Laterality: N/A;   Past Medical History:  Diagnosis Date  . Anemia   . Aortic regurgitation    moderate AR by 11/25/15 echo  . Arthritis   . Chronic kidney disease   . Diabetes mellitus without complication ([place])   . History of blood transfusion   . History of bronchitis   . History of pneumonia   . Hypertension   . Peripheral arterial disease ([place])   . Peripheral vascular disease ([place])   . Pulmonary hypertension    Q000111Q echo: PA systolic pressure severely increaed, PA peak pressure 66 mm Hg.   . 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 SOB (  shortness of breath)   . Stress incontinence    BP (!) 152/62   Pulse 64   Resp 14   SpO2 95%   Opioid Risk Score:   Fall Risk Score:  `1  Depression screen PHQ 2/9  Depression screen [facility] 2/9 04/12/2016 01/11/2016  Decreased Interest 0 0  Down, Depressed, Hopeless 0 0  PHQ - 2 Score 0 0  Altered sleeping - 1  Tired, decreased energy - 0  Change in appetite - 0  Feeling bad or failure about yourself  - 0  Trouble concentrating - 0  Moving slowly or fidgety/restless - 0  Suicidal thoughts - 0  PHQ-9 Score - 1    Review of Systems  Constitutional: Negative.   HENT: Negative.   Eyes:  Negative.   Respiratory: Negative.   Cardiovascular: Negative.   Gastrointestinal: Negative.   Endocrine: Negative.   Genitourinary: Positive for difficulty urinating.  Musculoskeletal: Negative.   Skin: Negative.   Neurological: Negative.   Hematological: Negative.   Psychiatric/Behavioral: Negative.   All other systems reviewed and are negative.      Objective:   Physical Exam  Constitutional: She is oriented to person, place, and time. She appears well-developed and well-nourished.  HENT:  Head: Normocephalic and atraumatic.  Neck: Normal range of motion. Neck supple.  Cardiovascular: Normal rate and regular rhythm.   Pulmonary/Chest: Effort normal and breath sounds normal.  Musculoskeletal:  Normal Muscle Bulk and Muscle Testing Reveals: Upper Extremities: Full ROM and Muscle Strength 5/5 Lower Extremities: Right AKA: Stump Clean and Dry wearing Prosthesis  Arrived in Wheelchair   Neurological: She is alert and oriented to person, place, and time.  Skin: Skin is warm and dry.  Psychiatric: She has a normal mood and affect.  Nursing note and vitals reviewed.         Assessment & Plan:  1. S/P Right AKA: Vascular Surgery Following. Completed Physical Therapy, Notes Reviewed.  2. Abnormality of Gait: Continue Prosthesis with Walker/ Also using  wheelchair.  20 minutes of face to face patient care time was spent during this visit. All questions were encouraged and answered.   F/U in 6 months with Dr. Posey Pronto

## 2016-11-07 ENCOUNTER — Encounter: Payer: Self-pay | Admitting: Vascular Surgery

## 2016-11-14 ENCOUNTER — Encounter: Payer: Medicare Other | Admitting: Vascular Surgery

## 2016-11-14 ENCOUNTER — Encounter: Payer: Self-pay | Admitting: Vascular Surgery

## 2016-11-14 ENCOUNTER — Ambulatory Visit (INDEPENDENT_AMBULATORY_CARE_PROVIDER_SITE_OTHER): Payer: Medicare Other | Admitting: Vascular Surgery

## 2016-11-14 VITALS — BP 165/72 | HR 68 | Temp 97.6°F | Resp 18 | Ht 62.0 in | Wt 135.0 lb

## 2016-11-14 DIAGNOSIS — I779 Disorder of arteries and arterioles, unspecified: Secondary | ICD-10-CM

## 2016-11-14 DIAGNOSIS — I739 Peripheral vascular disease, unspecified: Secondary | ICD-10-CM

## 2016-11-14 NOTE — Progress Notes (Signed)
Patient name: Jessica Abbott MRN: [id number] DOB: 1940/11/25 Sex: female  REASON FOR VISIT: Follow up of arteriogram.  HPI: Jessica Abbott is a 76 y.o. female who I performed a right above-the-knee amputation on in the past. The patient is had a previous left femoral to dorsalis pedis artery bypass done elsewhere. A follow up duplex scan the patient was noted to have some competing AV fistula. The patient was set up for an arteriogram. Her arteriogram was performed on 10/22/2016. This showed that the common femoral and deep femoral arteries on the left were patent. The superficial femoral artery was occluded in the thigh. The left femoral to dorsalis pedis artery bypass graft was widely patent. There was a small fistula in the mid thigh and a large fistula around the level of the knee. The patient comes in to discuss possible ligation of the fistulas in her bypass graft.  The patient denies any significant pain in the left leg. She does have her prosthesis and ambulates some. She denies any significant swelling in the left leg.  Past Medical History:  Diagnosis Date  . Anemia   . Aortic regurgitation    moderate AR by 11/25/15 echo  . Arthritis   . Chronic kidney disease   . Diabetes mellitus without complication ([place])   . History of blood transfusion   . History of bronchitis   . History of pneumonia   . Hypertension   . Peripheral arterial disease ([place])   . Peripheral vascular disease ([place])   . Pulmonary hypertension    [id number] echo: PA systolic pressure severely increaed, PA peak pressure 66 mm Hg.   . 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 SOB (shortness of breath)   . Stress incontinence     Family History  Problem Relation Age of Onset  . Heart disease Father     SOCIAL HISTORY: Social History  Substance Use Topics  . Smoking status: Former Smoker    Quit date: 10/27/2013  . Smokeless tobacco: Never Used  . Alcohol use No    Allergies  Allergen  Reactions  . Gabapentin Other (See Comments)    Per patient, 300mg  dose made her extremely weak and unable to move. Ok with lower doses.  . Gabapentin (Once-Daily) Other (See Comments)    Per patient, 300mg  dose made her extremely weak and unable to move. Ok with lower doses.  . Other Other (See Comments)    Per patient, 300mg  dose made her extremely weak and unable to move. Ok with lower doses.    Current Outpatient Prescriptions  Medication Sig Dispense Refill  . albuterol (PROVENTIL HFA;VENTOLIN HFA) 108 (90 Base) MCG/ACT inhaler Inhale 2 puffs into the lungs every 6 (six) hours as needed for wheezing or shortness of breath.    Marland Kitchen amLODipine (NORVASC) 5 MG tablet Take 5 mg by mouth daily.    Marland Kitchen aspirin EC 81 MG tablet Take 81 mg by mouth daily.     . baclofen (LIORESAL) 10 MG tablet Take 0.5 tablets (5 mg total) by mouth 4 (four) times daily. (Patient taking differently: Take 10 mg by mouth 2 (two) times daily. ) 60 each 0  . docusate sodium (COLACE) 100 MG capsule Take 100 mg by mouth daily as needed (constipation).    . ferrous sulfate 325 (65 FE) MG tablet Take 1 tablet (325 mg total) by mouth 2 (two) times daily with a meal. 60 tablet 1  . lactose free nutrition (BOOST) LIQD Take 237  mLs by mouth daily as needed (occasionally).     Marland Kitchen lisinopril (PRINIVIL,ZESTRIL) 10 MG tablet Take 10 mg by mouth daily.    . metFORMIN (GLUCOPHAGE) 500 MG tablet Take 500 mg by mouth daily with breakfast. Reported on 03/14/2016    . Cholecalciferol 1000 units tablet Take 1,000 Units by mouth daily.     Current Facility-Administered Medications  Medication Dose Route Frequency Provider Last Rate Last Dose  . fentaNYL (SUBLIMAZE) injection 25 mcg  25 mcg Intravenous Once Angelia Mould, MD      . midazolam (VERSED) injection 1 mg  1 mg Intravenous Once Angelia Mould, MD        REVIEW OF SYSTEMS:  [X]  denotes positive finding, [ ]  denotes negative finding Cardiac  Comments:  Chest pain or  chest pressure:    Shortness of breath upon exertion:    Short of breath when lying flat:    Irregular heart rhythm:        Vascular    Pain in calf, thigh, or hip brought on by ambulation:    Pain in feet at night that wakes you up from your sleep:     Blood clot in your veins:    Leg swelling:         Pulmonary    Oxygen at home:    Productive cough:     Wheezing:         Neurologic    Sudden weakness in arms or legs:     Sudden numbness in arms or legs:     Sudden onset of difficulty speaking or slurred speech:    Temporary loss of vision in one eye:     Problems with dizziness:         Gastrointestinal    Blood in stool:     Vomited blood:         Genitourinary    Burning when urinating:     Blood in urine:        Psychiatric    Major depression:         Hematologic    Bleeding problems:    Problems with blood clotting too easily:        Skin    Rashes or ulcers:        Constitutional    Fever or chills:      PHYSICAL EXAM: Vitals:   11/14/16 1225 11/14/16 1228  BP: (!) 163/70 (!) 165/72  Pulse: 68   Resp: 18   Temp: 97.6 F (36.4 C)   TempSrc: Oral   SpO2: 96%   Weight: 135 lb (61.2 kg)   Height: 5\' 2"  (1.575 m)     GENERAL: The patient is a well-nourished female, in no acute distress. The vital signs are documented above. CARDIAC: There is a regular rate and rhythm.  VASCULAR: She has palpable femoral pulses are palpable dorsalis pedis pulse on the left. PULMONARY: There is good air exchange bilaterally without wheezing or rales. ABDOMEN: Soft and non-tender with normal pitched bowel sounds.  MUSCULOSKELETAL: She has a right above-the-knee amputation. NEUROLOGIC: No focal weakness or paresthesias are detected. SKIN: There are no ulcers or rashes noted. PSYCHIATRIC: The patient has a normal affect.  DATA:   ARTERIOGRAM: Her arteriogram showed that her femoral to dorsalis pedis bypass graft was widely patent. There was one large fistula just  below the knee and several small fistulas.  MEDICAL ISSUES:  STATUS POST LEFT FEMORAL TO DORSALIS PEDIS BYPASS: This patient  had an in situ bypass in [facility]. She does have a large fistula just below the knee and several small fistulas. However she is asymptomatic. She denies any pain or swelling in the left leg. For this reason, I would not recommend ligation of her fistulas unless she became symptomatic. I ordered a follow up duplex of her graft in 6 months and I'll see her back at that time. She knows to call sooner if she has problems.  [name] [facility] [phone]

## 2016-11-20 NOTE — Addendum Note (Signed)
Addended by: Lianne Cure A on: 11/20/2016 03:24 PM   Modules accepted: Orders

## 2017-01-24 ENCOUNTER — Encounter: Payer: Self-pay | Admitting: Physical Medicine & Rehabilitation

## 2017-01-24 ENCOUNTER — Encounter: Payer: Medicare Other | Attending: Physical Medicine & Rehabilitation | Admitting: Physical Medicine & Rehabilitation

## 2017-01-24 VITALS — BP 151/53 | HR 77 | Resp 14

## 2017-01-24 DIAGNOSIS — Z87891 Personal history of nicotine dependence: Secondary | ICD-10-CM | POA: Diagnosis not present

## 2017-01-24 DIAGNOSIS — R269 Unspecified abnormalities of gait and mobility: Secondary | ICD-10-CM | POA: Diagnosis not present

## 2017-01-24 DIAGNOSIS — I779 Disorder of arteries and arterioles, unspecified: Secondary | ICD-10-CM

## 2017-01-24 DIAGNOSIS — I129 Hypertensive chronic kidney disease with stage 1 through stage 4 chronic kidney disease, or unspecified chronic kidney disease: Secondary | ICD-10-CM | POA: Insufficient documentation

## 2017-01-24 DIAGNOSIS — N189 Chronic kidney disease, unspecified: Secondary | ICD-10-CM | POA: Diagnosis not present

## 2017-01-24 DIAGNOSIS — E1122 Type 2 diabetes mellitus with diabetic chronic kidney disease: Secondary | ICD-10-CM | POA: Diagnosis not present

## 2017-01-24 DIAGNOSIS — N393 Stress incontinence (female) (male): Secondary | ICD-10-CM | POA: Insufficient documentation

## 2017-01-24 DIAGNOSIS — I272 Pulmonary hypertension, unspecified: Secondary | ICD-10-CM | POA: Insufficient documentation

## 2017-01-24 DIAGNOSIS — M1990 Unspecified osteoarthritis, unspecified site: Secondary | ICD-10-CM | POA: Diagnosis not present

## 2017-01-24 DIAGNOSIS — I10 Essential (primary) hypertension: Secondary | ICD-10-CM | POA: Diagnosis present

## 2017-01-24 DIAGNOSIS — D649 Anemia, unspecified: Secondary | ICD-10-CM | POA: Insufficient documentation

## 2017-01-24 DIAGNOSIS — I739 Peripheral vascular disease, unspecified: Secondary | ICD-10-CM | POA: Insufficient documentation

## 2017-01-24 DIAGNOSIS — M24561 Contracture, right knee: Secondary | ICD-10-CM | POA: Diagnosis not present

## 2017-01-24 DIAGNOSIS — I351 Nonrheumatic aortic (valve) insufficiency: Secondary | ICD-10-CM | POA: Diagnosis not present

## 2017-01-24 DIAGNOSIS — Z89611 Acquired absence of right leg above knee: Secondary | ICD-10-CM | POA: Diagnosis not present

## 2017-01-24 NOTE — Progress Notes (Signed)
Subjective:    Patient ID: Jessica Abbott, female    DOB: [birth year], 76 y.o.   MRN: [id number]  HPI  76 y.o. female with history of HTN, DMT2, CKD, pulmonary HTN, chronic right knee contracture, PVD presents for follow up for right AKA.    Last clinic visit 10/26/16, seen by NP.  Notes reviewed. She has completed therapies.  She is following up with Vascular Surg. She states she is still try to get used to the prosthesis.  Denies falls. Usually uses walker at home and wheelchair in community.    Pain Inventory Average Pain 0 Pain Right Now 0 My pain is no pain  In the last 24 hours, has pain interfered with the following? General activity 5 Relation with others 8 Enjoyment of life 7 What TIME of day is your pain at its worst? no pain Sleep (in general) Fair  Pain is worse with: no pain Pain improves with: no pain Relief from Meds:  no pain  Mobility walk with assistance use a walker  Function retired  Neuro/Psych No problems in this area  Prior Studies Any changes since last visit?  no  Physicians involved in your care Any changes since last visit?  no   Family History  Problem Relation Age of Onset  . Heart disease Father    Social History   Social History  . Marital status: Married    Spouse name: N/A  . Number of children: N/A  . Years of education: N/A   Social History Main Topics  . Smoking status: Former Smoker    Quit date: 10/27/2013  . Smokeless tobacco: Never Used  . Alcohol use No  . Drug use: No  . Sexual activity: Not Asked   Other Topics Concern  . None   Social History Narrative  . None   Past Surgical History:  Procedure Laterality Date  . ABDOMINAL AORTOGRAM W/LOWER EXTREMITY N/A 10/22/2016   Procedure: Abdominal Aortogram w/Lower Extremity;  Surgeon: Angelia Mould, MD;  Location: [facility];  Service: Cardiovascular;  Laterality: N/A;  lt leg  . ABDOMINAL HYSTERECTOMY    . AMPUTATION Right 12/23/2015   Procedure:  RIGHT  ABOVE KNEE AMPUTATION;  Surgeon: Angelia Mould, MD;  Location: [place] [facility];  Service: Vascular;  Laterality: Right;  . BACK SURGERY  2002  . COLONOSCOPY N/A 11/24/2015   Procedure: COLONOSCOPY;  Surgeon: Ronald Lobo, MD;  Location: [facility] ENDOSCOPY;  Service: Endoscopy;  Laterality: N/A;  . ESOPHAGOGASTRODUODENOSCOPY (EGD) WITH PROPOFOL N/A 11/23/2015   Procedure: ESOPHAGOGASTRODUODENOSCOPY (EGD) WITH PROPOFOL;  Surgeon: Ronald Lobo, MD;  Location: [facility] ENDOSCOPY;  Service: Endoscopy;  Laterality: N/A;  . femoral artery arteriogram  04/20/2015  . GIVENS CAPSULE STUDY N/A 11/26/2015   Procedure: GIVENS CAPSULE STUDY;  Surgeon: Ronald Lobo, MD;  Location: [facility] ENDOSCOPY;  Service: Endoscopy;  Laterality: N/A;   Past Medical History:  Diagnosis Date  . Anemia   . Aortic regurgitation    moderate AR by 11/25/15 echo  . Arthritis   . Chronic kidney disease   . Diabetes mellitus without complication ([place])   . History of blood transfusion   . History of bronchitis   . History of pneumonia   . Hypertension   . Peripheral arterial disease ([place])   . Peripheral vascular disease ([place])   . Pulmonary hypertension ([place])    2/87/86 echo: PA systolic pressure severely increaed, PA peak pressure 66 mm Hg.   . S/P AKA (above knee amputation) unilateral ([place])  12/26/2015  . SOB (shortness of breath)   . Stress incontinence    BP (!) 151/53   Pulse 77   Resp 14   SpO2 96%   Opioid Risk Score:   Fall Risk Score:  `1  Depression screen PHQ 2/9  Depression screen [facility] 2/9 04/12/2016 01/11/2016  Decreased Interest 0 0  Down, Depressed, Hopeless 0 0  PHQ - 2 Score 0 0  Altered sleeping - 1  Tired, decreased energy - 0  Change in appetite - 0  Feeling bad or failure about yourself  - 0  Trouble concentrating - 0  Moving slowly or fidgety/restless - 0  Suicidal thoughts - 0  PHQ-9 Score - 1    Review of Systems  Constitutional: Negative.  Negative for chills and fever.  HENT: Negative.     Eyes: Negative.   Respiratory: Negative.   Cardiovascular: Negative.   Gastrointestinal: Negative.   Endocrine: Negative.   Genitourinary: Negative.   Musculoskeletal: Positive for gait problem. Negative for back pain.  Skin: Negative.   Allergic/Immunologic: Negative.   Hematological: Negative.   Psychiatric/Behavioral: Negative.   All other systems reviewed and are negative.     Objective:   Physical Exam Constitutional: She appears well-developed and well-nourished. NAD. HENT: Normocephalic and atraumatic.  Eyes: EOMI. NO discharge.  Cardiovascular: RRR.No JVD. Respiratory: Effort normal and breath sounds normal. No stridor.  GI: Soft. She exhibits no distension. There is no tenderness.  Musculoskeletal: She exhibits no edema and no tenderness.  LLE healed old surgical incisions.  R-AKA well shaped, healed Neurological: She is alert and oriented.  Motor: B/l UE 5/5 LLE hip flexion 5/5, knee extension 5/5, ankle dorsi/plantar flexion 5/5 Right hip flexion 5/5   Skin: Left heel remains boggy (unchanged).  Right AKA healed.   Psychiatric: Her speech is normal. Her affect is normal.    Assessment & Plan:  76 y.o. female with history of HTN, DMT2, CKD, pulmonary HTN, chronic right knee contracture, PVD presents for follow up for right AKA.   1. S/p Right AKA with prosthesis  Cont HEP  Cont to follow up with Vasc surgery  Follow up with [employer] for adjustments to prosthesis for better gait  2. Boggy left heel  Cont pressure relief measures  Cont Prevalon boot at night  3. Abnormality of gait  Cont walker at home, wheelchair in community  Limitted toe touch

## 2017-04-25 ENCOUNTER — Encounter: Payer: Medicare Other | Admitting: Physical Medicine & Rehabilitation

## 2017-05-02 ENCOUNTER — Encounter: Payer: Self-pay | Admitting: Physical Medicine & Rehabilitation

## 2017-05-02 ENCOUNTER — Encounter: Payer: Medicare Other | Attending: Physical Medicine & Rehabilitation | Admitting: Physical Medicine & Rehabilitation

## 2017-05-02 VITALS — BP 162/70 | HR 80

## 2017-05-02 DIAGNOSIS — M1990 Unspecified osteoarthritis, unspecified site: Secondary | ICD-10-CM | POA: Insufficient documentation

## 2017-05-02 DIAGNOSIS — Z87891 Personal history of nicotine dependence: Secondary | ICD-10-CM | POA: Insufficient documentation

## 2017-05-02 DIAGNOSIS — I10 Essential (primary) hypertension: Secondary | ICD-10-CM | POA: Diagnosis present

## 2017-05-02 DIAGNOSIS — I739 Peripheral vascular disease, unspecified: Secondary | ICD-10-CM | POA: Insufficient documentation

## 2017-05-02 DIAGNOSIS — M24561 Contracture, right knee: Secondary | ICD-10-CM | POA: Diagnosis not present

## 2017-05-02 DIAGNOSIS — E118 Type 2 diabetes mellitus with unspecified complications: Secondary | ICD-10-CM | POA: Diagnosis not present

## 2017-05-02 DIAGNOSIS — E1122 Type 2 diabetes mellitus with diabetic chronic kidney disease: Secondary | ICD-10-CM | POA: Diagnosis not present

## 2017-05-02 DIAGNOSIS — I272 Pulmonary hypertension, unspecified: Secondary | ICD-10-CM | POA: Diagnosis not present

## 2017-05-02 DIAGNOSIS — D649 Anemia, unspecified: Secondary | ICD-10-CM | POA: Insufficient documentation

## 2017-05-02 DIAGNOSIS — N393 Stress incontinence (female) (male): Secondary | ICD-10-CM | POA: Insufficient documentation

## 2017-05-02 DIAGNOSIS — R269 Unspecified abnormalities of gait and mobility: Secondary | ICD-10-CM

## 2017-05-02 DIAGNOSIS — I351 Nonrheumatic aortic (valve) insufficiency: Secondary | ICD-10-CM | POA: Diagnosis not present

## 2017-05-02 DIAGNOSIS — I129 Hypertensive chronic kidney disease with stage 1 through stage 4 chronic kidney disease, or unspecified chronic kidney disease: Secondary | ICD-10-CM | POA: Insufficient documentation

## 2017-05-02 DIAGNOSIS — Z89611 Acquired absence of right leg above knee: Secondary | ICD-10-CM

## 2017-05-02 DIAGNOSIS — N189 Chronic kidney disease, unspecified: Secondary | ICD-10-CM | POA: Diagnosis not present

## 2017-05-02 DIAGNOSIS — I779 Disorder of arteries and arterioles, unspecified: Secondary | ICD-10-CM | POA: Diagnosis not present

## 2017-05-02 NOTE — Progress Notes (Signed)
Subjective:    Patient ID: Jessica Abbott, female    DOB: [birth year], 76 y.o.   MRN: [id number]  HPI  76 y.o. female with history of HTN, DMT2, CKD, pulmonary HTN, chronic right knee contracture, PVD presents for follow up for right AKA.    Last clinic visit 01/24/17.  Since last visit, she continues to follow up with Vasc Surgery.  She had adjustment to her prosthesis.  Denies falls.  Uses walker at all times.    Pain Inventory Average Pain 0 Pain Right Now 0 My pain is no pain  In the last 24 hours, has pain interfered with the following? General activity 0 Relation with others 0 Enjoyment of life 0 What TIME of day is your pain at its worst? no pain Sleep (in general) Good  Pain is worse with: no pain Pain improves with: no pain Relief from Meds: 9  Mobility walk with assistance use a walker how many minutes can you walk? 120 ability to climb steps?  no do you drive?  no  Function retired  Neuro/Psych bladder control problems  Prior Studies Any changes since last visit?  no  Physicians involved in your care Any changes since last visit?  no   Family History  Problem Relation Age of Onset  . Heart disease Father    Social History   Social History  . Marital status: Married    Spouse name: N/A  . Number of children: N/A  . Years of education: N/A   Social History Main Topics  . Smoking status: Former Smoker    Quit date: 10/27/2013  . Smokeless tobacco: Never Used  . Alcohol use No  . Drug use: No  . Sexual activity: Not Asked   Other Topics Concern  . None   Social History Narrative  . None   Past Surgical History:  Procedure Laterality Date  . ABDOMINAL AORTOGRAM W/LOWER EXTREMITY N/A 10/22/2016   Procedure: Abdominal Aortogram w/Lower Extremity;  Surgeon: Angelia Mould, MD;  Location: [place] [facility];  Service: Cardiovascular;  Laterality: N/A;  lt leg  . ABDOMINAL HYSTERECTOMY    . AMPUTATION Right 12/23/2015   Procedure: RIGHT   ABOVE KNEE AMPUTATION;  Surgeon: Angelia Mould, MD;  Location: [place];  Service: Vascular;  Laterality: Right;  . BACK SURGERY  2002  . COLONOSCOPY N/A 11/24/2015   Procedure: COLONOSCOPY;  Surgeon: Ronald Lobo, MD;  Location[facility] END[facility]COPY;  Service: Endoscopy;  Laterality: N/A;  . ESOPHAGOGASTRODUODENOSCOPY (EGD) WITH PROPOFOL N/A 11/23/2015   Procedure: ESOPHAGOGASTRODUODENOSCOPY (EGD) WITH PROPOFOL;  Surgeon: Ronald Lobo, MD;  Location[facility] ENDOSCOPY;  Service: Endoscopy;  Laterality: N/A;  . femoral artery arteriogram  04/20/2015  . GIVENS CAPSULE STUDY N/A 11/26/2015   Procedure: GIVENS CAPSULE STUDY;  Surgeon: Ronald Lobo, MD;  Location: [facility] ENDOSCOPY;  Service: Endoscopy;  Laterality: N/A;   Past Medical History:  Diagnosis Date  . Anemia   . Aortic regurgitation    moderate AR by 11/25/15 echo  . Arthritis   . Chronic kidney disease   . Diabetes mellitus without complication ([place])   . History of blood transfusion   . History of bronchitis   . History of pneumonia   . Hypertension   . Peripheral arterial disease ([place])   . Peripheral vascular disease ([place])   . Pulmonary hypertension ([place])    1/82/99 echo: PA systolic pressure severely increaed, PA peak pressure 66 mm Hg.   . S/P AKA (above knee amputation) unilateral ([place])  12/26/2015  . SOB (shortness of breath)   . Stress incontinence    BP (!) 162/70   Pulse 80   SpO2 95%   Opioid Risk Score:   Fall Risk Score:  `1  Depression screen PHQ 2/9  Depression screen [facility] 2/9 04/12/2016 01/11/2016  Decreased Interest 0 0  Down, Depressed, Hopeless 0 0  PHQ - 2 Score 0 0  Altered sleeping - 1  Tired, decreased energy - 0  Change in appetite - 0  Feeling bad or failure about yourself  - 0  Trouble concentrating - 0  Moving slowly or fidgety/restless - 0  Suicidal thoughts - 0  PHQ-9 Score - 1    Review of Systems  Constitutional: Negative.  Negative for chills and fever.  HENT: Negative.   Eyes: Negative.     Respiratory: Negative.   Cardiovascular: Negative.   Gastrointestinal: Negative.   Endocrine: Negative.   Genitourinary: Negative.   Musculoskeletal: Positive for gait problem. Negative for back pain.  Skin: Negative.   Allergic/Immunologic: Negative.   Hematological: Negative.   Psychiatric/Behavioral: Negative.   All other systems reviewed and are negative.     Objective:   Physical Exam Constitutional: She appears well-developed and well-nourished. NAD. HENT: Normocephalic and atraumatic.  Eyes: EOMI. NO discharge.  Cardiovascular: RRR.No JVD. Respiratory: Effort normal and breath sounds normal. No stridor.  GI: Soft. She exhibits no distension. There is no tenderness.  Musculoskeletal: She exhibits no edema and no tenderness.  LLE healed old surgical incisions.  R-AKA well shaped, healed Gait: Dragging foot on right with walker Neurological: She is alert and oriented.  Motor: B/l UE 5/5 LLE hip flexion 5/5, knee extension 5/5, ankle dorsi/plantar flexion 5/5 Right hip flexion 5/5   Skin: Right AKA healed.   Psychiatric: Her speech is normal. Her affect is normal.    Assessment & Plan:  76 y.o. female with history of HTN, DMT2, CKD, pulmonary HTN, chronic right knee contracture, PVD presents for follow up for right AKA.   1. S/p Right AKA with prosthesis  Cont HEP  Cont to follow up with Vasc surgery  Follow up with [employer] for adjustments to prosthesis for better gait again due to foot drag  2. Boggy left heel  Cont pressure relief measures  Cont Prevalon boot at night  3. Abnormality of gait  Cont walker

## 2017-05-15 ENCOUNTER — Ambulatory Visit: Payer: Medicare Other | Admitting: Vascular Surgery

## 2017-05-15 ENCOUNTER — Encounter: Payer: Self-pay | Admitting: Vascular Surgery

## 2017-05-15 ENCOUNTER — Ambulatory Visit (HOSPITAL_COMMUNITY)
Admission: RE | Admit: 2017-05-15 | Discharge: 2017-05-15 | Disposition: A | Discharge status: Home / Self Care | Payer: Medicare Other | Source: Ambulatory Visit | Attending: Vascular Surgery | Admitting: Vascular Surgery

## 2017-05-15 DIAGNOSIS — I739 Peripheral vascular disease, unspecified: Secondary | ICD-10-CM | POA: Insufficient documentation

## 2017-05-22 ENCOUNTER — Encounter: Payer: Self-pay | Admitting: Vascular Surgery

## 2017-05-22 ENCOUNTER — Ambulatory Visit (INDEPENDENT_AMBULATORY_CARE_PROVIDER_SITE_OTHER): Payer: Medicare Other | Admitting: Vascular Surgery

## 2017-05-22 VITALS — BP 147/56 | HR 68 | Temp 98.0°F | Ht 62.0 in | Wt 144.9 lb

## 2017-05-22 DIAGNOSIS — R0989 Other specified symptoms and signs involving the circulatory and respiratory systems: Secondary | ICD-10-CM | POA: Diagnosis not present

## 2017-05-22 DIAGNOSIS — I739 Peripheral vascular disease, unspecified: Secondary | ICD-10-CM | POA: Diagnosis not present

## 2017-05-22 DIAGNOSIS — I779 Disorder of arteries and arterioles, unspecified: Secondary | ICD-10-CM

## 2017-05-22 NOTE — Progress Notes (Signed)
Patient name: Jessica Abbott MRN: [id number] DOB: 1940-12-19 Sex: female  REASON FOR VISIT:    Follow up Of left femoral to dorsalis pedis bypass graft.  HPI:   Jessica Abbott is a pleasant 76 y.o. female who I last saw on 11/14/2016. She has a history of a right above-the-knee amputation. She had a left femoral to dorsalis pedis bypass done elsewhere. She had an arteriogram in February of this year which showed that the left femoral to dorsalis pedis artery bypass graft was widely patent. There was a small fistula in the mid thigh and a large fistula level of the knee. She was asymptomatic. She did not have any pain or swelling in her left leg. I recommended that we only consider ligation of the fistulas if she became symptomatic. She comes in for a 6 month follow up visit.  Since I saw her last she denies any significant claudication in the left lower extremity. She denies rest pain. She is ambulatory with her prosthesis. She denies any history of nonhealing wounds. She has not had any significant swelling in the left lower extremity.  She has had some generalized fatigue.   She denies any history of stroke, TIAs, expressive or receptive aphasia, or amaurosis fugax.  Past Medical History:  Diagnosis Date  . Anemia   . Aortic regurgitation    moderate AR by 11/25/15 echo  . Arthritis   . Chronic kidney disease   . Diabetes mellitus without complication ([place])   . History of blood transfusion   . History of bronchitis   . History of pneumonia   . Hypertension   . Peripheral arterial disease ([place])   . Peripheral vascular disease ([place])   . Pulmonary hypertension ([place])    6/38/75 echo: PA systolic pressure severely increaed, PA peak pressure 66 mm Hg.   . S/P AKA (above knee amputation) unilateral ([place]) 12/26/2015  . SOB (shortness of breath)   . Stress incontinence     Family History  Problem Relation Age of Onset  . Heart disease Father     SOCIAL HISTORY: Social History    Substance Use Topics  . Smoking status: Former Smoker    Quit date: 10/27/2013  . Smokeless tobacco: Never Used  . Alcohol use No    Allergies  Allergen Reactions  . Gabapentin Other (See Comments)    Per patient, 300mg  dose made her extremely weak and unable to move. Ok with lower doses.    Current Outpatient Prescriptions  Medication Sig Dispense Refill  . albuterol (PROVENTIL HFA;VENTOLIN HFA) 108 (90 Base) MCG/ACT inhaler Inhale 2 puffs into the lungs every 6 (six) hours as needed for wheezing or shortness of breath.    Marland Kitchen amLODipine (NORVASC) 5 MG tablet Take 5 mg by mouth daily.    Marland Kitchen aspirin EC 81 MG tablet Take 81 mg by mouth daily.     . baclofen (LIORESAL) 10 MG tablet Take 10 mg by mouth.    . Cholecalciferol 1000 units tablet Take 1,000 Units by mouth daily.    Marland Kitchen docusate sodium (COLACE) 100 MG capsule Take 100 mg by mouth daily as needed (constipation).    . ferrous sulfate 325 (65 FE) MG tablet Take 1 tablet (325 mg total) by mouth 2 (two) times daily with a meal. 60 tablet 1  . lactose free nutrition (BOOST) LIQD Take 237 mLs by mouth daily as needed (occasionally).     Marland Kitchen lisinopril (PRINIVIL,ZESTRIL) 10 MG tablet Take 10 mg by mouth daily.    Marland Kitchen  metFORMIN (GLUCOPHAGE) 500 MG tablet Take 500 mg by mouth daily with breakfast. Reported on 03/14/2016     No current facility-administered medications for this visit.     REVIEW OF SYSTEMS:  [X]  denotes positive finding, [ ]  denotes negative finding Cardiac  Comments:  Chest pain or chest pressure:    Shortness of breath upon exertion:    Short of breath when lying flat:    Irregular heart rhythm:        Vascular    Pain in calf, thigh, or hip brought on by ambulation:    Pain in feet at night that wakes you up from your sleep:     Blood clot in your veins:    Leg swelling:         Pulmonary    Oxygen at home:    Productive cough:     Wheezing:         Neurologic    Sudden weakness in arms or legs:     Sudden  numbness in arms or legs:     Sudden onset of difficulty speaking or slurred speech:    Temporary loss of vision in one eye:     Problems with dizziness:         Gastrointestinal    Blood in stool:     Vomited blood:         Genitourinary    Burning when urinating:     Blood in urine:        Psychiatric    Major depression:         Hematologic    Bleeding problems:    Problems with blood clotting too easily:        Skin    Rashes or ulcers:        Constitutional    Fever or chills:     PHYSICAL EXAM:   Vitals:   05/22/17 1029  BP: (!) 147/56  Pulse: 68  Temp: 98 F (36.7 C)  TempSrc: Oral  SpO2: 99%  Weight: 144 lb 14.4 oz (65.7 kg)  Height: 5\' 2"  (1.575 m)    GENERAL: The patient is a well-nourished female, in no acute distress. The vital signs are documented above. CARDIAC: There is a regular rate and rhythm.  VASCULAR: She has a left carotid bruit. She has a palpable left femoral pulse and a palpable graft pulse. In addition she has a palpable graft pulse just above her anastomosis in her foot. PULMONARY: There is good air exchange bilaterally without wheezing or rales. ABDOMEN: Soft and non-tender with normal pitched bowel sounds.  MUSCULOSKELETAL: She has a right AKA. NEUROLOGIC: No focal weakness or paresthesias are detected. SKIN: There are no ulcers or rashes noted. PSYCHIATRIC: The patient has a normal affect.  DATA:    ARTERIOGRAM: I reviewed her arteriogram that was done in February of this year. In addition to the fistulas it is noted that the graft originates from the proximal superficial femoral artery.  DUPLEX OF LEFT LOWER EXTREMITY BYPASS GRAFT: I have independently interpreted the duplex of the left lower extremity that was performed on 05/06/1917. This shows monophasic signals throughout the graft and elevated velocities in the proximal anastomosis suggesting a greater than 70% stenosis.  MEDICAL ISSUES:   STATUS POST LEFT FEMORAL TO  DORSALIS PEDIS BYPASS: This bypass graft was done in-situ in [facility]. She has some AV fistulas which we were following. However now she appears to have developed stenosis at the proximal anastomosis where  the graft is anastomosed to the superficial femoral artery. She also has a greater than 70% stenosis in the proximal left calf. I think that given the multiple issues with the graft she is at risk for graft thrombosis and have recommended revision of her graft. This would involve ligation of the competing fistulas or at least the larger ones, and patch angioplasty of the proximal graft and also the area of stenosis in the calf. Her bypass graft is into a small dorsalis pedis artery and I'm concerned that the graft is at risk for thrombosis. Currently she is reluctant to proceed with surgery. When she comes back in for her carotid duplex can we will discuss this again and hopefully we can schedule her surgery at that time.  LEFT CAROTID BRUIT: This patient has a left carotid bruit. She is asymptomatic. I have ordered a carotid duplex scan. She is on aspirin.  [name] [facility] [phone]

## 2017-05-23 NOTE — Addendum Note (Signed)
Addended by: Lianne Cure A on: 05/23/2017 10:19 AM   Modules accepted: Orders

## 2017-05-26 IMAGING — DX DG FOOT COMPLETE 3+V*R*
3 series · 3 of 3 positions shown · non-contrast
Comparison: None.

CLINICAL DATA: Right foot pain. Injury in [REDACTED]. Initial
encounter.

EXAM:
RIGHT FOOT COMPLETE - 3+ VIEW

[foot ap]
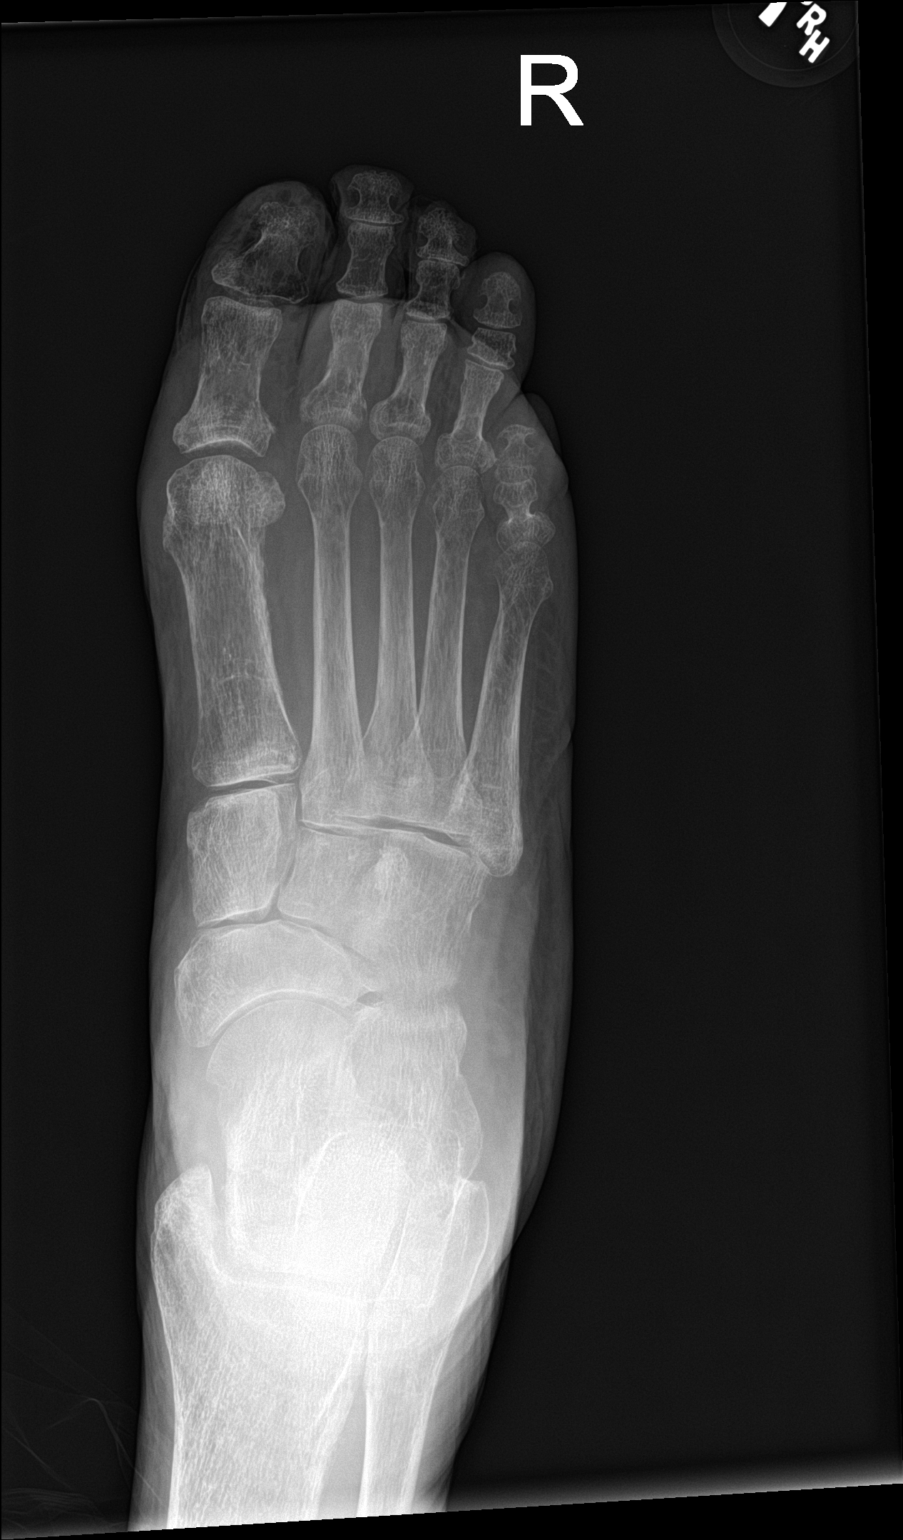

[foot lat]
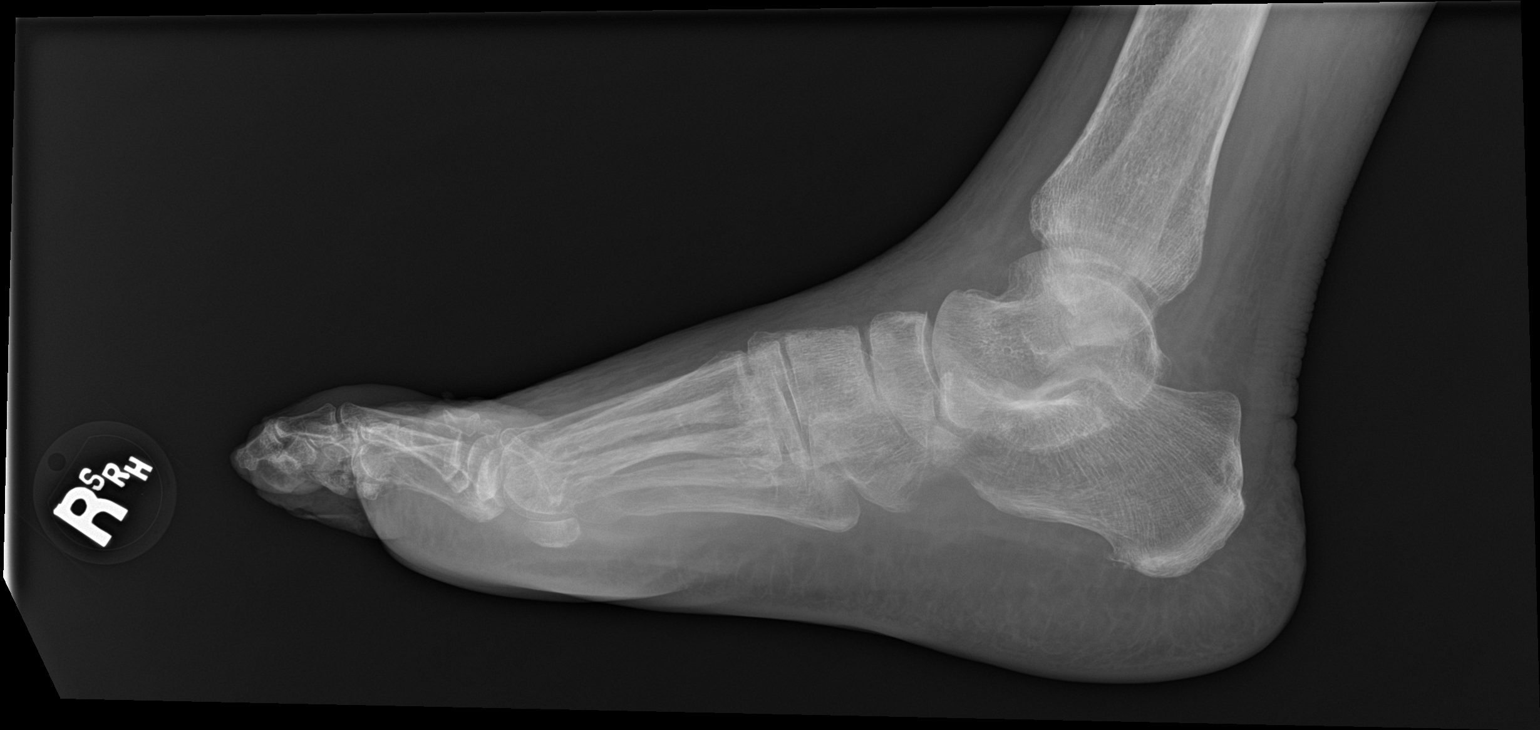

[foot obl]
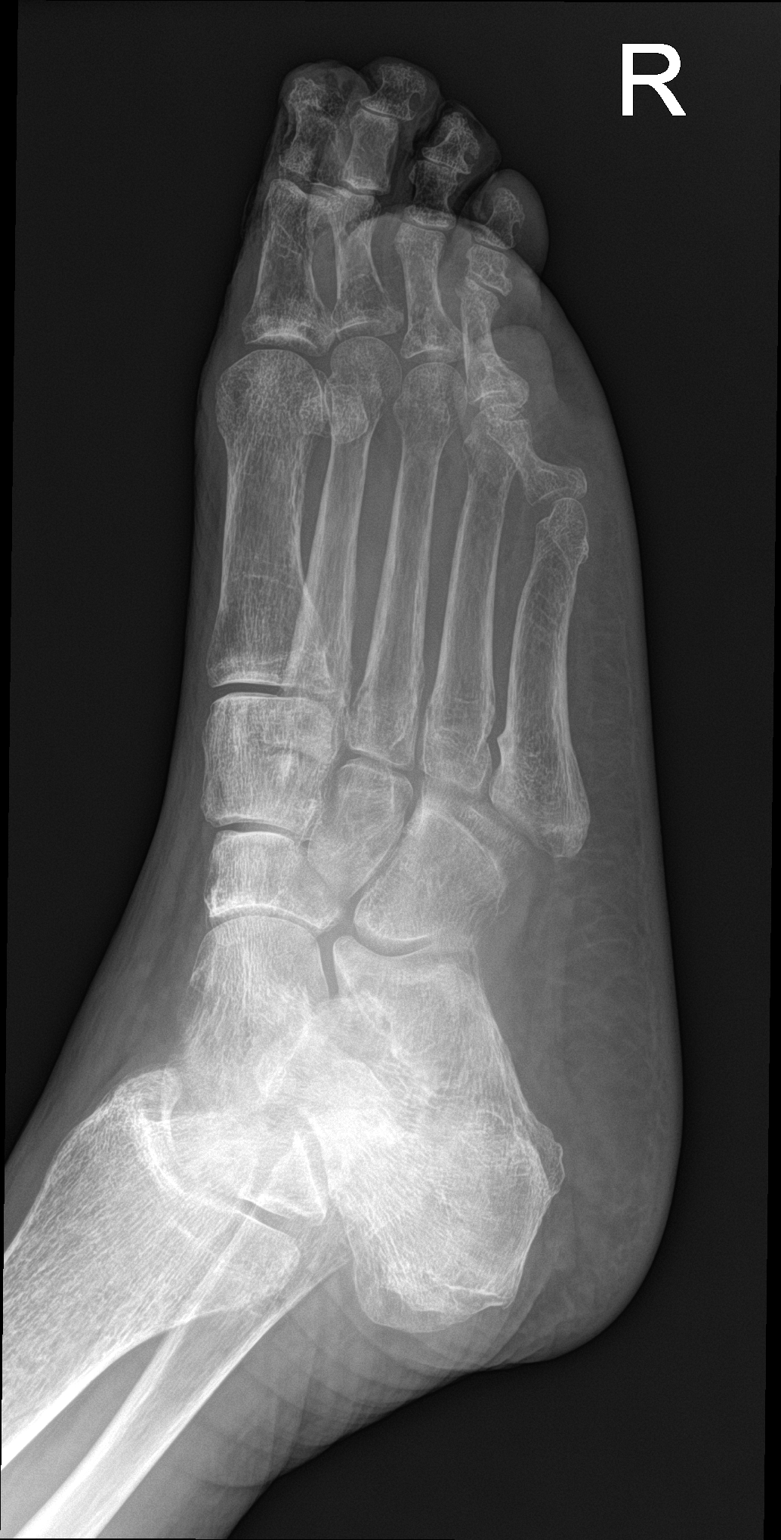

[3 of 3 positions shown; findings below may reference images not displayed]

FINDINGS: There is no evidence of fracture or dislocation. No evidence
osteolysis or periostitis. Generalized osteopenia noted. Soft tissue
swelling and soft tissue gas involving the distal great toe.
IMPRESSION: Soft tissue swelling and soft tissue gas involving the distal great
toe. Gas-forming infection cannot be excluded.

No acute osseous abnormality identified.

## 2017-05-29 IMAGING — NM NM GI BLOOD LOSS
2 series · 12 of 12 positions shown · non-contrast
Comparison: None; correlation CT abdomen and pelvis 04/01/2014

CLINICAL DATA: Dark stools off and on, heme-positive stool, melena,
hemoglobin 3.3, underwent blood transfusions, gastrointestinal
hemorrhage of unspecified location, hypertension, diabetes mellitus,
peripheral vascular disease

EXAM:
NUCLEAR MEDICINE GASTROINTESTINAL BLEEDING SCAN
TECHNIQUE: Sequential abdominal images were obtained following intravenous
administration of Cc-LLm labeled red blood cells.
RADIOPHARMACEUTICALS:  25.1 mCi Cc-LLm in-vitro labeled autologous
red cells.

[gi gi bleed · 5.01mm/px · 6 of 60 frames shown (1 of 2)]
[frame 6/60]
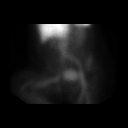
[frame 16/60]
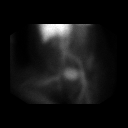
[frame 26/60]
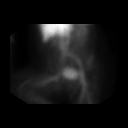
[frame 36/60]
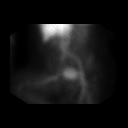
[frame 46/60]
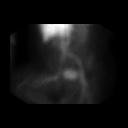
[frame 56/60]
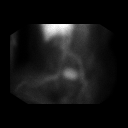

[gi gi bleed · 5.01mm/px · 6 of 60 frames shown (2 of 2)]
[frame 6/60]
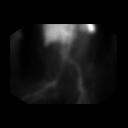
[frame 16/60]
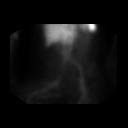
[frame 26/60]
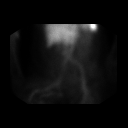
[frame 36/60]
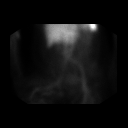
[frame 46/60]
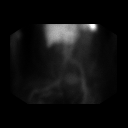
[frame 56/60]
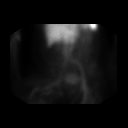

[12 of 12 positions shown; findings below may reference images not displayed]

FINDINGS: Normal blood pool distribution of tracer.

Excretion of de-labeled tracer into urinary bladder over the 2 hours
of imaging.

No abnormal gastrointestinal localization of tracer identified to
suggest active GI bleeding.

Patient's RIGHT leg is flexed throughout the exam.
IMPRESSION: No scintigraphic evidence of active GI bleeding.

## 2017-06-20 ENCOUNTER — Ambulatory Visit: Payer: Medicare Other | Admitting: Vascular Surgery

## 2017-06-20 ENCOUNTER — Encounter (HOSPITAL_COMMUNITY): Payer: Medicare Other

## 2017-07-03 ENCOUNTER — Encounter: Payer: Self-pay | Admitting: Vascular Surgery

## 2017-07-03 ENCOUNTER — Ambulatory Visit (HOSPITAL_COMMUNITY)
Admission: RE | Admit: 2017-07-03 | Discharge: 2017-07-03 | Disposition: A | Discharge status: Home / Self Care | Payer: Medicare Other | Source: Ambulatory Visit | Attending: Vascular Surgery | Admitting: Vascular Surgery

## 2017-07-03 ENCOUNTER — Ambulatory Visit (INDEPENDENT_AMBULATORY_CARE_PROVIDER_SITE_OTHER): Payer: Medicare Other | Admitting: Vascular Surgery

## 2017-07-03 VITALS — BP 149/59 | HR 74 | Temp 97.4°F | Resp 14 | Ht 62.0 in | Wt 140.0 lb

## 2017-07-03 DIAGNOSIS — I6523 Occlusion and stenosis of bilateral carotid arteries: Secondary | ICD-10-CM | POA: Insufficient documentation

## 2017-07-03 DIAGNOSIS — I739 Peripheral vascular disease, unspecified: Secondary | ICD-10-CM

## 2017-07-03 DIAGNOSIS — I779 Disorder of arteries and arterioles, unspecified: Secondary | ICD-10-CM

## 2017-07-03 DIAGNOSIS — I70261 Atherosclerosis of native arteries of extremities with gangrene, right leg: Secondary | ICD-10-CM

## 2017-07-03 DIAGNOSIS — R0989 Other specified symptoms and signs involving the circulatory and respiratory systems: Secondary | ICD-10-CM | POA: Insufficient documentation

## 2017-07-03 LAB — VAS US CAROTID
LCCADDIAS: 14 cm/s
LCCADSYS: 61 cm/s
LICADDIAS: -25 cm/s
LICADSYS: -99 cm/s
LICAPDIAS: -23 cm/s
LICAPSYS: -82 cm/s
Left CCA prox dias: 25 cm/s
Left CCA prox sys: 122 cm/s
RCCADSYS: -85 cm/s
RIGHT CCA MID DIAS: 17 cm/s
RIGHT ECA DIAS: -14 cm/s
Right CCA prox dias: 14 cm/s
Right CCA prox sys: 93 cm/s

## 2017-07-03 NOTE — Progress Notes (Signed)
HISTORY AND PHYSICAL     CC:  Follow up Requesting Provider:  Nicola Girt, DO  HPI: This is a 76 y.o. female who has a hx of right above-the-knee amputation. She had a left femoral to dorsalis pedis bypass done elsewhere. She had an arteriogram in February of this year which showed that the left femoral to dorsalis pedis artery bypass graft was widely patent. There was a small fistula in the mid thigh and a large fistula level of the knee. She was asymptomatic. She did not have any pain or swelling in her left leg. I recommended that we only consider ligation of the fistulas if she became symptomatic. She comes in for a 6 month follow up visit.  At her last visit, a left carotid bruit was heard and she was brought back to have a carotid duplex.  She denies any TIAs, speech difficulty or visual problems.    She quit smoking 1.5 years ago.  She takes an ACEI and CCB for blood pressure management.  She takes a daily aspirin.  Past Medical History:  Diagnosis Date  . Anemia   . Aortic regurgitation    moderate AR by 11/25/15 echo  . Arthritis   . Chronic kidney disease   . Diabetes mellitus without complication ([place])   . History of blood transfusion   . History of bronchitis   . History of pneumonia   . Hypertension   . Peripheral arterial disease ([place])   . Peripheral vascular disease ([place])   . Pulmonary hypertension ([place])    5/68/12 echo: PA systolic pressure severely increaed, PA peak pressure 66 mm Hg.   . S/P AKA (above knee amputation) unilateral ([place]) 12/26/2015  . SOB (shortness of breath)   . Stress incontinence     Past Surgical History:  Procedure Laterality Date  . ABDOMINAL AORTOGRAM W/LOWER EXTREMITY N/A 10/22/2016   Procedure: Abdominal Aortogram w/Lower Extremity;  Surgeon: Angelia Mould, MD;  Location: [facility];  Service: Cardiovascular;  Laterality: N/A;  lt leg  . ABDOMINAL HYSTERECTOMY    . AMPUTATION Right 12/23/2015   Procedure: RIGHT   ABOVE KNEE AMPUTATION;  Surgeon: Angelia Mould, MD;  Location: [facility];  Service: Vascular;  Laterality: Right;  . BACK SURGERY  2002  . COLONOSCOPY N/A 11/24/2015   Procedure: COLONOSCOPY;  Surgeon: Ronald Lobo, MD;  Location: [facility] ENDOSCOPY;  Service: Endoscopy;  Laterality: N/A;  . ESOPHAGOGASTRODUODENOSCOPY (EGD) WITH PROPOFOL N/A 11/23/2015   Procedure: ESOPHAGOGASTRODUODENOSCOPY (EGD) WITH PROPOFOL;  Surgeon: Ronald Lobo, MD;  Location: [facility] ENDOSCOPY;  Service: Endoscopy;  Laterality: N/A;  . femoral artery arteriogram  04/20/2015  . GIVENS CAPSULE STUDY N/A 11/26/2015   Procedure: GIVENS CAPSULE STUDY;  Surgeon: Ronald Lobo, MD;  Location: [facility] ENDOSCOPY;  Service: Endoscopy;  Laterality: N/A;    Allergies  Allergen Reactions  . Gabapentin Other (See Comments)    Per patient, 300mg  dose made her extremely weak and unable to move. Ok with lower doses.    Current Outpatient Prescriptions  Medication Sig Dispense Refill  . acetaminophen (TYLENOL) 500 MG tablet Take 500 mg by mouth.    Marland Kitchen albuterol (PROVENTIL HFA;VENTOLIN HFA) 108 (90 Base) MCG/ACT inhaler Inhale 2 puffs into the lungs every 6 (six) hours as needed for wheezing or shortness of breath.    Marland Kitchen amLODipine (NORVASC) 5 MG tablet Take 5 mg by mouth daily.    Marland Kitchen aspirin EC 81 MG tablet Take 81 mg by mouth daily.     Marland Kitchen  baclofen (LIORESAL) 10 MG tablet Take 10 mg by mouth.    . Cholecalciferol 1000 units tablet Take 1,000 Units by mouth daily.    Marland Kitchen docusate sodium (COLACE) 100 MG capsule Take 100 mg by mouth daily as needed (constipation).    . ferrous sulfate 325 (65 FE) MG tablet Take 1 tablet (325 mg total) by mouth 2 (two) times daily with a meal. 60 tablet 1  . lactose free nutrition (BOOST) LIQD Take 237 mLs by mouth daily as needed (occasionally).     Marland Kitchen lisinopril (PRINIVIL,ZESTRIL) 10 MG tablet Take 10 mg by mouth daily.    . metFORMIN (GLUCOPHAGE) 500 MG tablet Take 500 mg by mouth daily with breakfast. Reported on  03/14/2016     No current facility-administered medications for this visit.     Family History  Problem Relation Age of Onset  . Heart disease Father     Social History   Social History  . Marital status: Married    Spouse name: N/A  . Number of children: N/A  . Years of education: N/A   Occupational History  . Not on file.   Social History Main Topics  . Smoking status: Former Smoker    Quit date: 10/27/2013  . Smokeless tobacco: Never Used  . Alcohol use No  . Drug use: No  . Sexual activity: Not on file   Other Topics Concern  . Not on file   Social History Narrative  . No narrative on file     REVIEW OF SYSTEMS:   [X]  denotes positive finding, [ ]  denotes negative finding Cardiac  Comments:  Chest pain or chest pressure:    Shortness of breath upon exertion:    Short of breath when lying flat:    Irregular heart rhythm:        Vascular    Pain in calf, thigh, or hip brought on by ambulation:    Pain in feet at night that wakes you up from your sleep:     Blood clot in your veins:    Leg swelling:         Pulmonary    Oxygen at home:    Productive cough:     Wheezing:         Neurologic    Sudden weakness in arms or legs:     Sudden numbness in arms or legs:     Sudden onset of difficulty speaking or slurred speech:    Temporary loss of vision in one eye:     Problems with dizziness:         Gastrointestinal    Blood in stool:     Vomited blood:         Genitourinary    Burning when urinating:     Blood in urine:        Psychiatric    Major depression:         Hematologic    Bleeding problems:    Problems with blood clotting too easily:        Skin    Rashes or ulcers:        Constitutional    Fever or chills:      PHYSICAL EXAMINATION:  Vitals:   07/03/17 1445 07/03/17 1446  BP: (!) 142/54 (!) 149/59  Pulse: 74 74  Resp:    Temp:    SpO2:     Vitals:   07/03/17 1443  Weight: 140 lb (63.5 kg)  Height: 5\' 2"  (  1.575 m)     Body mass index is 25.61 kg/m.  General:  WDWN in NAD; vital signs documented above Gait: Not observed HENT: WNL, normocephalic Pulmonary: normal non-labored breathing , without Rales, rhonchi,  wheezing Cardiac: regular HR, without  Murmurs with  leftcarotid bruit Abdomen: soft, NT, no masses Skin: without rashes Vascular Exam/Pulses:  Right Left  DP AKA 2+ (normal)  PT AKA Unable to palpate    Extremities: without ischemic changes, without Gangrene , without cellulitis; without open wounds;  Musculoskeletal: no muscle wasting or atrophy  Neurologic: A&O X 3;  No focal weakness or paresthesias are detected Psychiatric:  The pt has Normal affect.   Non-Invasive Vascular Imaging:   Carotid duplex 07/03/17: Right and left ICA velocities suggests 1-39% stenosis Left ECA velocities suggests >50% stenosis  Aortogram 10/22/16: FINDINGS:  1. The right renal artery was superior and was not identified. The left renal artery had a 30% stenosis. The infrarenal aorta bilateral common iliac arteries were widely patent. 2. On the left side, the common femoral and deep femoral artery are patent. The superficial femoral artery is patent proximally, but occludes in the thigh.. The left femoral to dorsalis pedis artery bypass graft is widely patent. The tibials are all occluded on the left. 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  CLINICAL NOTE: The patient will be evaluated in the office for possible ligation of the fistula of her left femoral dorsalis pedis bypass patent graft.  Pt meds includes: Statin:  No. Beta Blocker:  No. Aspirin:  Yes.   ACEI:  Yes.   ARB:  No. CCB use:  Yes Other Antiplatelet/Anticoagulant:  No   ASSESSMENT/PLAN:: 76 y.o. female who is s/p left femoral to DP bypass grafting in situ performed in [facility] and hx of right AKA   -At the pt's last visit on 05/22/17, Dr. Scot Dock heard a left carotid bruit and  the pt was brought back for a carotid duplex, which was performed today and it revealed 1-39% stenosis bilaterally.  Pt is asymptomatic.  -as for her left leg bypass graft, Dr. Scot Dock had spoken with the pt about ligating competing fistulas and a revision of the the graft, but the pt was reluctant to proceed.  She continues to be asymptomatic and denies any rest pain or non healing wounds.  She does have a palpable left DP pulse.  We will have her f/u in 6 months with ABI's and an arterial duplex of the left leg bypass graft.   -pt with soreness on the posterior aspect of the right AKA-AKA examined and is soft and non tender to palpation.  Dr. Scot Dock recommended leaving the prosthesis off for a couple of days and/or contacting [facility] for recommendations. -she will contact us sooner should she have any problems.   Leontine Locket, PA-C [facility] [phone]  Clinic MD:  Pt seen and examined with Dr. Scot Dock  I have interviewed the patient and examined the patient. I agree with the findings by the PA. She does have some AV fistulas associated with her bypass graft that was done in situ. Currently she is asymptomatic and does not wish to consider surgery. I have explained that these may enlarge over time should we do need to continue to follow this. I've ordered a graft duplex and ABIs in 6 months and see her back at that time. She knows to call sooner if she has problems.  Gae Gallop, MD [phone]

## 2017-07-03 NOTE — Progress Notes (Signed)
Vitals:   07/03/17 1443 07/03/17 1445  BP: 127/60 (!) 142/54  Pulse: 74 74  Resp: 14   Temp: (!) 97.4 F (36.3 C)   SpO2: 96%   Weight: 140 lb (63.5 kg)   Height: 5\' 2"  (1.575 m)

## 2017-07-22 NOTE — Addendum Note (Signed)
Addended by: Lianne Cure A on: 07/22/2017 04:40 PM   Modules accepted: Orders

## 2017-08-01 ENCOUNTER — Encounter: Payer: Medicare Other | Admitting: Physical Medicine & Rehabilitation

## 2018-01-01 ENCOUNTER — Ambulatory Visit (HOSPITAL_COMMUNITY)
Admission: RE | Admit: 2018-01-01 | Discharge: 2018-01-01 | Disposition: A | Discharge status: Home / Self Care | Payer: Medicare Other | Source: Ambulatory Visit | Attending: Vascular Surgery | Admitting: Vascular Surgery

## 2018-01-01 ENCOUNTER — Encounter: Payer: Self-pay | Admitting: Vascular Surgery

## 2018-01-01 ENCOUNTER — Ambulatory Visit (INDEPENDENT_AMBULATORY_CARE_PROVIDER_SITE_OTHER): Payer: Medicare Other | Admitting: Vascular Surgery

## 2018-01-01 ENCOUNTER — Ambulatory Visit (INDEPENDENT_AMBULATORY_CARE_PROVIDER_SITE_OTHER)
Admission: RE | Admit: 2018-01-01 | Discharge: 2018-01-01 | Disposition: A | Discharge status: Home / Self Care | Payer: Medicare Other | Source: Ambulatory Visit | Attending: Vascular Surgery | Admitting: Vascular Surgery

## 2018-01-01 ENCOUNTER — Other Ambulatory Visit: Payer: Self-pay

## 2018-01-01 VITALS — BP 155/56 | HR 64 | Temp 97.1°F | Resp 18 | Ht 62.0 in | Wt 140.0 lb

## 2018-01-01 DIAGNOSIS — I739 Peripheral vascular disease, unspecified: Secondary | ICD-10-CM | POA: Diagnosis not present

## 2018-01-01 DIAGNOSIS — E1151 Type 2 diabetes mellitus with diabetic peripheral angiopathy without gangrene: Secondary | ICD-10-CM | POA: Diagnosis not present

## 2018-01-01 DIAGNOSIS — Z89511 Acquired absence of right leg below knee: Secondary | ICD-10-CM | POA: Diagnosis not present

## 2018-01-01 DIAGNOSIS — I70261 Atherosclerosis of native arteries of extremities with gangrene, right leg: Secondary | ICD-10-CM | POA: Insufficient documentation

## 2018-01-01 DIAGNOSIS — Z794 Long term (current) use of insulin: Secondary | ICD-10-CM | POA: Insufficient documentation

## 2018-01-01 DIAGNOSIS — I779 Disorder of arteries and arterioles, unspecified: Secondary | ICD-10-CM

## 2018-01-01 NOTE — Progress Notes (Signed)
Patient name: Jessica Abbott MRN: [id number] DOB: [birth year] Sex: female  REASON FOR VISIT:   Follow-up of peripheral vascular disease.  HPI:   Jessica Abbott is a pleasant 77 y.o. female who I last saw on 07/03/2017.  This patient has a right above-the-knee amputation.  She has had a previous left femoral to dorsalis pedis artery bypass that was done elsewhere I believe in situ.  I have been following a small fistula in the mid thigh and a large fistula at the level of the knee.  She is asymptomatic.  When I last saw her we discussed only ligating the fistula was if she became symptomatic and she was set up for a 47-month follow-up visit.  Since I saw her last, she denies any claudication in the left leg or rest pain.  Her activity is very limited because of her right knee amputation.  There have been no significant changes in her medical history.  She denies any history of stroke, TIAs, expressive or receptive aphasia, or amaurosis fugax.  Past Medical History:  Diagnosis Date  . Anemia   . Aortic regurgitation    moderate AR by 11/25/15 echo  . Arthritis   . Chronic kidney disease   . Diabetes mellitus without complication ([place])   . History of blood transfusion   . History of bronchitis   . History of pneumonia   . Hypertension   . Peripheral arterial disease ([place])   . Peripheral vascular disease ([place])   . Pulmonary hypertension ([place])    9/56/21 echo: PA systolic pressure severely increaed, PA peak pressure 66 mm Hg.   . S/P AKA (above knee amputation) unilateral ([place]) 12/26/2015  . SOB (shortness of breath)   . Stress incontinence     Family History  Problem Relation Age of Onset  . Heart disease Father     SOCIAL HISTORY: Social History   Tobacco Use  . Smoking status: Former Smoker    Last attempt to quit: 10/27/2013    Years since quitting: 4.1  . Smokeless tobacco: Never Used  Substance Use Topics  . Alcohol use: No    Alcohol/week: 0.0 oz    Allergies    Allergen Reactions  . Gabapentin Other (See Comments)    Per patient, 300mg  dose made her extremely weak and unable to move. Ok with lower doses.    Current Outpatient Medications  Medication Sig Dispense Refill  . acetaminophen (TYLENOL) 500 MG tablet Take 500 mg by mouth.    Marland Kitchen albuterol (PROVENTIL HFA;VENTOLIN HFA) 108 (90 Base) MCG/ACT inhaler Inhale 2 puffs into the lungs every 6 (six) hours as needed for wheezing or shortness of breath.    Marland Kitchen amLODipine (NORVASC) 5 MG tablet Take 5 mg by mouth daily.    Marland Kitchen aspirin EC 81 MG tablet Take 81 mg by mouth daily.     . baclofen (LIORESAL) 10 MG tablet Take 10 mg by mouth daily as needed.     . Cholecalciferol 1000 units tablet Take 1,000 Units by mouth daily.    Marland Kitchen docusate sodium (COLACE) 100 MG capsule Take 100 mg by mouth daily as needed (constipation).    . ferrous sulfate 325 (65 FE) MG tablet Take 1 tablet (325 mg total) by mouth 2 (two) times daily with a meal. 60 tablet 1  . lactose free nutrition (BOOST) LIQD Take 237 mLs by mouth daily as needed (occasionally).     Marland Kitchen lisinopril (PRINIVIL,ZESTRIL) 10 MG tablet Take 10 mg by mouth daily.    Marland Kitchen  metFORMIN (GLUCOPHAGE) 500 MG tablet Take 500 mg by mouth daily with breakfast. Reported on 03/14/2016     No current facility-administered medications for this visit.     REVIEW OF SYSTEMS:  [X]  denotes positive finding, [ ]  denotes negative finding Cardiac  Comments:  Chest pain or chest pressure:    Shortness of breath upon exertion:    Short of breath when lying flat:    Irregular heart rhythm:        Vascular    Pain in calf, thigh, or hip brought on by ambulation:    Pain in feet at night that wakes you up from your sleep:     Blood clot in your veins:    Leg swelling:         Pulmonary    Oxygen at home:    Productive cough:     Wheezing:         Neurologic    Sudden weakness in arms or legs:     Sudden numbness in arms or legs:     Sudden onset of difficulty speaking or  slurred speech:    Temporary loss of vision in one eye:     Problems with dizziness:         Gastrointestinal    Blood in stool:     Vomited blood:         Genitourinary    Burning when urinating:     Blood in urine:        Psychiatric    Major depression:         Hematologic    Bleeding problems:    Problems with blood clotting too easily:        Skin    Rashes or ulcers:        Constitutional    Fever or chills:     PHYSICAL EXAM:   Vitals:   01/01/18 1606  BP: (!) 155/56  Pulse: 64  Resp: 18  Temp: (!) 97.1 F (36.2 C)  TempSrc: Oral  SpO2: 99%  Weight: 140 lb (63.5 kg)  Height: 5\' 2"  (1.575 m)   GENERAL: The patient is a well-nourished female, in no acute distress. The vital signs are documented above. CARDIAC: There is a regular rate and rhythm.  VASCULAR: She has a left carotid bruit. She has a palpable left femoral pulse and a palpable graft pulse along the medial aspect of her left leg. She has monophasic Doppler signals in the left foot. Her right above-the-knee amputation is healed. PULMONARY: There is good air exchange bilaterally without wheezing or rales. ABDOMEN: Soft and non-tender with normal pitched bowel sounds.  MUSCULOSKELETAL: There are no major deformities or cyanosis. NEUROLOGIC: No focal weakness or paresthesias are detected. SKIN: There are no ulcers or rashes noted. PSYCHIATRIC: The patient has a normal affect.  DATA:    ARTERIAL DOPPLER STUDY: I have independently interpreted the arterial Doppler study today.  Patient has monophasic Doppler signals in the left dorsalis pedis and posterior tibial positions.  ABI on the left is 58%.  Toe pressure is 78 mmHg.    DUPLEX OF LEFT FEMORAL TO DORSALIS PEDIS ARTERY BYPASS: I have independently interpreted her duplex scan today.  The patient has a patent femoral to dorsalis pedis artery bypass with monophasic Doppler signals throughout.  There are elevated velocities in the proximal anastomosis  suggesting a 50-70% stenosis.  There are also increased velocities in the proximal calf suggesting a 50-70% stenosis.  The fistulas are  again identified.  I reviewed the arteriogram that was done on 10/22/2016.  This showed that the left femoral to dorsalis pedis artery bypass graft was widely patent.  There was a small fistula in the mid thigh a large fistula around the level of the knee.  There is no significant inflow disease.  The tibials were all occluded on the left.  MEDICAL ISSUES:   RIGHT LOWER EXTREMITY BYPASS FOLLOW-UP: This patient had an in situ left femoral to dorsalis pedis artery bypass graft done elsewhere.  She has 2 fistulas which we have been following.  She also has progression of her tibial disease distally.  Duplex suggested perhaps some stenosis in the common femoral artery proximal to the graft however she has a reasonable pulse in the groin.  The patient is 77 years old and is asymptomatic and I am trying to continue with a conservative approach and less the stenosis proximally progresses.  I have ordered a follow-up duplex scan in 6 months and I will see her back at that time.  She knows to call sooner if she has problems.  If the stenosis progresses above the graft we may need to consider repeat arteriography as her last study was in February 2018.  LEFT CAROTID BRUIT: The patient does have a left carotid bruit.  She is asymptomatic.  She had a carotid duplex scan on 07/03/2017 which showed no significant carotid disease bilaterally.  She had an external carotid artery stenosis on the left which likely explains her bruit.   [name] [facility] [phone]

## 2018-01-27 ENCOUNTER — Other Ambulatory Visit: Payer: Self-pay

## 2018-01-27 DIAGNOSIS — Z89611 Acquired absence of right leg above knee: Secondary | ICD-10-CM

## 2018-01-27 DIAGNOSIS — I779 Disorder of arteries and arterioles, unspecified: Secondary | ICD-10-CM

## 2018-02-12 ENCOUNTER — Encounter: Payer: Medicare Other | Attending: Physical Medicine & Rehabilitation | Admitting: Physical Medicine & Rehabilitation

## 2018-02-12 ENCOUNTER — Encounter: Payer: Self-pay | Admitting: Physical Medicine & Rehabilitation

## 2018-02-12 VITALS — BP 147/63 | HR 89 | Ht 62.0 in | Wt 138.0 lb

## 2018-02-12 DIAGNOSIS — Z87891 Personal history of nicotine dependence: Secondary | ICD-10-CM | POA: Diagnosis not present

## 2018-02-12 DIAGNOSIS — I779 Disorder of arteries and arterioles, unspecified: Secondary | ICD-10-CM

## 2018-02-12 DIAGNOSIS — I129 Hypertensive chronic kidney disease with stage 1 through stage 4 chronic kidney disease, or unspecified chronic kidney disease: Secondary | ICD-10-CM | POA: Diagnosis present

## 2018-02-12 DIAGNOSIS — E1122 Type 2 diabetes mellitus with diabetic chronic kidney disease: Secondary | ICD-10-CM | POA: Insufficient documentation

## 2018-02-12 DIAGNOSIS — Z89611 Acquired absence of right leg above knee: Secondary | ICD-10-CM | POA: Diagnosis not present

## 2018-02-12 DIAGNOSIS — N189 Chronic kidney disease, unspecified: Secondary | ICD-10-CM | POA: Diagnosis not present

## 2018-02-12 DIAGNOSIS — R269 Unspecified abnormalities of gait and mobility: Secondary | ICD-10-CM | POA: Diagnosis not present

## 2018-02-12 DIAGNOSIS — I272 Pulmonary hypertension, unspecified: Secondary | ICD-10-CM | POA: Diagnosis not present

## 2018-02-12 DIAGNOSIS — E1151 Type 2 diabetes mellitus with diabetic peripheral angiopathy without gangrene: Secondary | ICD-10-CM | POA: Diagnosis not present

## 2018-02-12 DIAGNOSIS — M24561 Contracture, right knee: Secondary | ICD-10-CM | POA: Insufficient documentation

## 2018-02-12 NOTE — Progress Notes (Signed)
Subjective:    Patient ID: Jessica Abbott, female    DOB: 1941/02/14, 77 y.o.   MRN: [id number]  HPI  77 y.o. female with history of HTN, DMT2, CKD, pulmonary HTN, chronic right knee contracture, PVD presents for follow up for right AKA.    Last clinic visit 05/02/17.  Since last visit, pt states she followed up with prosthetist and note was sent regarding replacement prothesis due to shape of leg. Denies falls.    Pain Inventory Average Pain 0 Pain Right Now 0 My pain is no pain  In the last 24 hours, has pain interfered with the following? General activity 0 Relation with others 0 Enjoyment of life 0 What TIME of day is your pain at its worst? no pain Sleep (in general) Fair  Pain is worse with: no pain Pain improves with: no pain Relief from Meds: na  Mobility walk with assistance use a walker how many minutes can you walk? 120 ability to climb steps?  no do you drive?  no  Function retired  Neuro/Psych bladder control problems  Prior Studies Any changes since last visit?  no  Physicians involved in your care Any changes since last visit?  no   Family History  Problem Relation Age of Onset  . Heart disease Father    Social History   Socioeconomic History  . Marital status: Married    Spouse name: Not on file  . Number of children: Not on file  . Years of education: Not on file  . Highest education level: Not on file  Occupational History  . Not on file  Social Needs  . Financial resource strain: Not on file  . Food insecurity:    Worry: Not on file    Inability: Not on file  . Transportation needs:    Medical: Not on file    Non-medical: Not on file  Tobacco Use  . Smoking status: Former Smoker    Last attempt to quit: 10/27/2013    Years since quitting: 4.2  . Smokeless tobacco: Never Used  Substance and Sexual Activity  . Alcohol use: No    Alcohol/week: 0.0 oz  . Drug use: No  . Sexual activity: Not on file  Lifestyle  . Physical  activity:    Days per week: Not on file    Minutes per session: Not on file  . Stress: Not on file  Relationships  . Social connections:    Talks on phone: Not on file    Gets together: Not on file    Attends religious service: Not on file    Active member of club or organization: Not on file    Attends meetings of clubs or organizations: Not on file    Relationship status: Not on file  Other Topics Concern  . Not on file  Social History Narrative  . Not on file   Past Surgical History:  Procedure Laterality Date  . ABDOMINAL AORTOGRAM W/LOWER EXTREMITY N/A 10/22/2016   Procedure: Abdominal Aortogram w/Lower Extremity;  Surgeon: Angelia Mould, MD;  Location: [place] [facility];  Service: Cardiovascular;  Laterality: N/A;  lt leg  . ABDOMINAL HYSTERECTOMY    . AMPUTATION Right 12/23/2015   Procedure: RIGHT  ABOVE KNEE AMPUTATION;  Surgeon: Angelia Mould, MD;  Location: [place];  Service: Vascular;  Laterality: Right;  . BACK SURGERY  2002  . COLONOSCOPY N/A 11/24/2015   Procedure: COLONOSCOPY;  Surgeon: Ronald Lobo, MD;  Location: [place];  Service: Endoscopy;  Laterality: N/A;  . ESOPHAGOGASTRODUODENOSCOPY (EGD) WITH PROPOFOL N/A 11/23/2015   Procedure: ESOPHAGOGASTRODUODENOSCOPY (EGD) WITH PROPOFOL;  Surgeon: Ronald Lobo, MD;  Location: [facility];  Service: Endoscopy;  Laterality: N/A;  . femoral artery arteriogram  04/20/2015  . GIVENS CAPSULE STUDY N/A 11/26/2015   Procedure: GIVENS CAPSULE STUDY;  Surgeon: Ronald Lobo, MD;  Location: [facility];  Service: Endoscopy;  Laterality: N/A;   Past Medical History:  Diagnosis Date  . Anemia   . Aortic regurgitation    moderate AR by 11/25/15 echo  . Arthritis   . Chronic kidney disease   . Diabetes mellitus without complication ([place])   . History of blood transfusion   . History of bronchitis   . History of pneumonia   . Hypertension   . Peripheral arterial disease ([place])   . Peripheral vascular disease  ([place])   . Pulmonary hypertension ([place])    1/94/17 echo: PA systolic pressure severely increaed, PA peak pressure 66 mm Hg.   . S/P AKA (above knee amputation) unilateral ([place]) 12/26/2015  . SOB (shortness of breath)   . Stress incontinence    BP (!) 147/63   Pulse 89   Ht 5\' 2"  (1.575 m)   Wt 138 lb (62.6 kg)   SpO2 97%   BMI 25.24 kg/m   Opioid Risk Score:   Fall Risk Score:  `1  Depression screen PHQ 2/9  Depression screen [facility] 2/9 04/12/2016 01/11/2016  Decreased Interest 0 0  Down, Depressed, Hopeless 0 0  PHQ - 2 Score 0 0  Altered sleeping - 1  Tired, decreased energy - 0  Change in appetite - 0  Feeling bad or failure about yourself  - 0  Trouble concentrating - 0  Moving slowly or fidgety/restless - 0  Suicidal thoughts - 0  PHQ-9 Score - 1    Review of Systems  Constitutional: Negative.  Negative for chills and fever.  HENT: Negative.   Eyes: Negative.   Respiratory: Negative.   Cardiovascular: Negative.   Gastrointestinal: Negative.   Endocrine: Negative.   Genitourinary: Negative.   Musculoskeletal: Positive for gait problem. Negative for back pain.  Skin: Negative.   Allergic/Immunologic: Negative.   Hematological: Negative.   Psychiatric/Behavioral: Negative.   All other systems reviewed and are negative.     Objective:   Physical Exam Constitutional: She appears well-developed and well-nourished. NAD. HENT: Normocephalic and atraumatic.  Eyes: EOMI. NO discharge.  Cardiovascular: RRR.No JVD. Respiratory: Effort normaland breath sounds normal.  GI: Soft. She exhibits no distension. There is no tenderness.  Musculoskeletal: She exhibits no edema and no tenderness.  R-AKA well shaped, healed Gait: Step to gait Neurological: She is alert and oriented.  Motor: B/l UE 5/5 LLE hip flexion 5/5, knee extension 5/5, ankle dorsi/plantar flexion 5/5 Right hip flexion 5/5   Left heel boggy Skin: Right AKA healed.   Psychiatric: Her speech is  normal. Her affect is normal.    Assessment & Plan:  77 y.o. female with history of HTN, DMT2, CKD, pulmonary HTN, chronic right knee contracture, PVD presents for follow up for right AKA.   1. S/p Right AKA with prosthesis  Cont exercises   Cont to follow up with Vasc surgery  Follow up with [employer] for socket replacement due to decrease in edema, will discuss and provide prescription  2. Boggy left heel  Cont pressure relief measures  Cont Prevalon boot at night  3. Abnormality of gait  Cont walker  >25 minutes  spent with patient with >20 spent regarding prosthesis and gait education

## 2018-05-15 ENCOUNTER — Encounter: Payer: Medicare Other | Admitting: Physical Medicine & Rehabilitation

## 2018-05-16 ENCOUNTER — Encounter: Payer: Self-pay | Admitting: Physical Medicine & Rehabilitation

## 2018-05-16 ENCOUNTER — Encounter: Payer: Medicare Other | Attending: Physical Medicine & Rehabilitation | Admitting: Physical Medicine & Rehabilitation

## 2018-05-16 VITALS — BP 155/66 | HR 82 | Ht 62.0 in | Wt 135.0 lb

## 2018-05-16 DIAGNOSIS — Z8701 Personal history of pneumonia (recurrent): Secondary | ICD-10-CM | POA: Diagnosis not present

## 2018-05-16 DIAGNOSIS — Z8249 Family history of ischemic heart disease and other diseases of the circulatory system: Secondary | ICD-10-CM | POA: Diagnosis not present

## 2018-05-16 DIAGNOSIS — E1122 Type 2 diabetes mellitus with diabetic chronic kidney disease: Secondary | ICD-10-CM | POA: Diagnosis not present

## 2018-05-16 DIAGNOSIS — Z87891 Personal history of nicotine dependence: Secondary | ICD-10-CM | POA: Diagnosis not present

## 2018-05-16 DIAGNOSIS — E1151 Type 2 diabetes mellitus with diabetic peripheral angiopathy without gangrene: Secondary | ICD-10-CM | POA: Insufficient documentation

## 2018-05-16 DIAGNOSIS — Z4781 Encounter for orthopedic aftercare following surgical amputation: Secondary | ICD-10-CM | POA: Diagnosis present

## 2018-05-16 DIAGNOSIS — Z90710 Acquired absence of both cervix and uterus: Secondary | ICD-10-CM | POA: Insufficient documentation

## 2018-05-16 DIAGNOSIS — I129 Hypertensive chronic kidney disease with stage 1 through stage 4 chronic kidney disease, or unspecified chronic kidney disease: Secondary | ICD-10-CM | POA: Diagnosis not present

## 2018-05-16 DIAGNOSIS — R269 Unspecified abnormalities of gait and mobility: Secondary | ICD-10-CM | POA: Insufficient documentation

## 2018-05-16 DIAGNOSIS — Z89611 Acquired absence of right leg above knee: Secondary | ICD-10-CM | POA: Diagnosis not present

## 2018-05-16 DIAGNOSIS — I779 Disorder of arteries and arterioles, unspecified: Secondary | ICD-10-CM | POA: Diagnosis not present

## 2018-05-16 DIAGNOSIS — I272 Pulmonary hypertension, unspecified: Secondary | ICD-10-CM | POA: Diagnosis not present

## 2018-05-16 DIAGNOSIS — M24561 Contracture, right knee: Secondary | ICD-10-CM | POA: Diagnosis not present

## 2018-05-16 DIAGNOSIS — N189 Chronic kidney disease, unspecified: Secondary | ICD-10-CM | POA: Diagnosis not present

## 2018-05-16 NOTE — Progress Notes (Addendum)
Subjective:    Patient ID: Jessica Abbott, female    DOB: 03/09/1941, 77 y.o.   MRN: [id number]  HPI  77 y.o. female with history of HTN, DMT2, CKD, pulmonary HTN, chronic right knee contracture, PVD presents for follow up for right AKA.    Last clinic visit 02/12/17.  Daughter supplements history.  Since last visit, pt states she continues HEP.  She continues to follow up with Vasc. She did receive a new prosthesis. Denies falls.    Pain Inventory Average Pain 0 Pain Right Now 0 My pain is no pain  In the last 24 hours, has pain interfered with the following? General activity 0 Relation with others 0 Enjoyment of life 0 What TIME of day is your pain at its worst? no pain Sleep (in general) Fair  Pain is worse with: no pain Pain improves with: no pain Relief from Meds: na  Mobility walk with assistance use a walker how many minutes can you walk? 120 ability to climb steps?  no do you drive?  no  Function retired  Neuro/Psych bladder control problems trouble walking  Prior Studies Any changes since last visit?  no  Physicians involved in your care Any changes since last visit?  no   Family History  Problem Relation Age of Onset  . Heart disease Father    Social History   Socioeconomic History  . Marital status: Married    Spouse name: Not on file  . Number of children: Not on file  . Years of education: Not on file  . Highest education level: Not on file  Occupational History  . Not on file  Social Needs  . Financial resource strain: Not on file  . Food insecurity:    Worry: Not on file    Inability: Not on file  . Transportation needs:    Medical: Not on file    Non-medical: Not on file  Tobacco Use  . Smoking status: Former Smoker    Last attempt to quit: 10/27/2013    Years since quitting: 4.5  . Smokeless tobacco: Never Used  Substance and Sexual Activity  . Alcohol use: No    Alcohol/week: 0.0 standard drinks  . Drug use: No  . Sexual  activity: Not on file  Lifestyle  . Physical activity:    Days per week: Not on file    Minutes per session: Not on file  . Stress: Not on file  Relationships  . Social connections:    Talks on phone: Not on file    Gets together: Not on file    Attends religious service: Not on file    Active member of club or organization: Not on file    Attends meetings of clubs or organizations: Not on file    Relationship status: Not on file  Other Topics Concern  . Not on file  Social History Narrative  . Not on file   Past Surgical History:  Procedure Laterality Date  . ABDOMINAL AORTOGRAM W/LOWER EXTREMITY N/A 10/22/2016   Procedure: Abdominal Aortogram w/Lower Extremity;  Surgeon: Angelia Mould, MD;  Location: [place] [facility];  Service: Cardiovascular;  Laterality: N/A;  lt leg  . ABDOMINAL HYSTERECTOMY    . AMPUTATION Right 12/23/2015   Procedure: RIGHT  ABOVE KNEE AMPUTATION;  Surgeon: Angelia Mould, MD;  Location: [place];  Service: Vascular;  Laterality: Right;  . BACK SURGERY  2002  . COLONOSCOPY N/A 11/24/2015   Procedure: COLONOSCOPY;  Surgeon: Herbie Baltimore  Buccini, MD;  Location: [place] [facility];  Service: Endoscopy;  Laterality: N/A;  . ESOPHAGOGASTRODUODENOSCOPY (EGD) WITH PROPOFOL N/A 11/23/2015   Procedure: ESOPHAGOGASTRODUODENOSCOPY (EGD) WITH PROPOFOL;  Surgeon: Ronald Lobo, MD;  Location: [facility] ENDOSCOPY;  Service: Endoscopy;  Laterality: N/A;  . femoral artery arteriogram  04/20/2015  . GIVENS CAPSULE STUDY N/A 11/26/2015   Procedure: GIVENS CAPSULE STUDY;  Surgeon: Ronald Lobo, MD;  Location: [facility] ENDOSCOPY;  Service: Endoscopy;  Laterality: N/A;   Past Medical History:  Diagnosis Date  . Anemia   . Aortic regurgitation    moderate AR by 11/25/15 echo  . Arthritis   . Chronic kidney disease   . Diabetes mellitus without complication ([place])   . History of blood transfusion   . History of bronchitis   . History of pneumonia   . Hypertension   . Peripheral arterial  disease ([place])   . Peripheral vascular disease ([place])   . Pulmonary hypertension ([place])    6/56/81 echo: PA systolic pressure severely increaed, PA peak pressure 66 mm Hg.   . S/P AKA (above knee amputation) unilateral ([place]) 12/26/2015  . SOB (shortness of breath)   . Stress incontinence    BP (!) 155/66   Pulse 82   Ht 5\' 2"  (1.575 m)   Wt 135 lb (61.2 kg)   SpO2 93%   BMI 24.69 kg/m   Opioid Risk Score:   Fall Risk Score:  `1  Depression screen PHQ 2/9  Depression screen [facility] 2/9 04/12/2016 01/11/2016  Decreased Interest 0 0  Down, Depressed, Hopeless 0 0  PHQ - 2 Score 0 0  Altered sleeping - 1  Tired, decreased energy - 0  Change in appetite - 0  Feeling bad or failure about yourself  - 0  Trouble concentrating - 0  Moving slowly or fidgety/restless - 0  Suicidal thoughts - 0  PHQ-9 Score - 1    Review of Systems  Constitutional: Negative.   HENT: Negative.   Eyes: Negative.   Respiratory: Negative.   Cardiovascular: Negative.   Gastrointestinal: Negative.   Endocrine: Negative.   Genitourinary: Positive for difficulty urinating.  Musculoskeletal: Positive for gait problem.  Skin: Negative.   Allergic/Immunologic: Negative.   Hematological: Negative.   Psychiatric/Behavioral: Negative.   All other systems reviewed and are negative.     Objective:   Physical Exam Constitutional: She appears well-developed and well-nourished. NAD. HENT: Normocephalic and atraumatic.  Eyes: EOMI. NO discharge.  Cardiovascular: RRR. No JVD. Respiratory: Effort normal and breath sounds normal.  GI: She exhibits no distension. There is no tenderness.  Musculoskeletal: She exhibits no edema and no tenderness.  R-AKA well shaped, healed Gait: Exaggerated swing phase on LLE Neurological: She is alert and oriented.  Motor: B/l UE 5/5 LLE hip flexion 5/5, knee extension 5/5, ankle dorsi/plantar flexion 5/5 Right hip flexion 5/5   Left heel boggy Skin: Right AKA  healed Psychiatric: Her speech is normal. Her affect is normal.    Assessment & Plan:  77 y.o. female with history of HTN, DMT2, CKD, pulmonary HTN, chronic right knee contracture, PVD presents for follow up for right AKA.   1. S/p Right AKA with prosthesis  Cont exercises   Cont to follow up with Vasc surgery  New prosthesis fitting well  Will refer for therapies  2. Abnormality of gait  Cont walker  See #1

## 2018-05-16 NOTE — Addendum Note (Signed)
Addended by: Delice Lesch A on: 05/16/2018 11:46 AM   Modules accepted: Orders

## 2018-07-16 ENCOUNTER — Ambulatory Visit (INDEPENDENT_AMBULATORY_CARE_PROVIDER_SITE_OTHER)
Admission: RE | Admit: 2018-07-16 | Discharge: 2018-07-16 | Disposition: A | Discharge status: Home / Self Care | Payer: Medicare Other | Source: Ambulatory Visit | Attending: Internal Medicine | Admitting: Internal Medicine

## 2018-07-16 ENCOUNTER — Ambulatory Visit (INDEPENDENT_AMBULATORY_CARE_PROVIDER_SITE_OTHER): Payer: Medicare Other | Admitting: Vascular Surgery

## 2018-07-16 ENCOUNTER — Ambulatory Visit (HOSPITAL_COMMUNITY)
Admission: RE | Admit: 2018-07-16 | Discharge: 2018-07-16 | Disposition: A | Discharge status: Home / Self Care | Payer: Medicare Other | Source: Ambulatory Visit | Attending: Internal Medicine | Admitting: Internal Medicine

## 2018-07-16 ENCOUNTER — Encounter: Payer: Self-pay | Admitting: Vascular Surgery

## 2018-07-16 ENCOUNTER — Other Ambulatory Visit: Payer: Self-pay

## 2018-07-16 VITALS — BP 152/53 | HR 68 | Temp 97.1°F | Resp 16 | Ht 62.0 in | Wt 135.0 lb

## 2018-07-16 DIAGNOSIS — I779 Disorder of arteries and arterioles, unspecified: Secondary | ICD-10-CM

## 2018-07-16 DIAGNOSIS — Z89611 Acquired absence of right leg above knee: Secondary | ICD-10-CM | POA: Diagnosis not present

## 2018-07-16 NOTE — Progress Notes (Signed)
Patient name: Jessica Abbott MRN: [id number] DOB: August 26, 1941 Sex: female  REASON FOR VISIT:   Follow-up of peripheral vascular disease.  HPI:   Jessica Abbott is a pleasant 77 y.o. female who is undergone a previous left femoral to dorsalis pedis bypass graft in situ that was done elsewhere.  I have been following her with a small fistula in the mid thigh and a large fistula at the level of the knee.  Of note she is undergone previous right above-the-knee amputation.  Since I saw her last she denies any history of claudication although her activity is very limited.  She is just starting to use her right above-the-knee prosthesis again and her activities limited.  She denies any history of rest pain or nonhealing wounds of the left foot.  She is not a smoker.  She is on aspirin but is not on a statin.  Past Medical History:  Diagnosis Date  . Anemia   . Aortic regurgitation    moderate AR by 11/25/15 echo  . Arthritis   . Chronic kidney disease   . Diabetes mellitus without complication ([place])   . History of blood transfusion   . History of bronchitis   . History of pneumonia   . Hypertension   . Peripheral arterial disease ([place])   . Peripheral vascular disease ([place])   . Pulmonary hypertension ([place])    3/38/25 echo: PA systolic pressure severely increaed, PA peak pressure 66 mm Hg.   . S/P AKA (above knee amputation) unilateral ([place]) 12/26/2015  . SOB (shortness of breath)   . Stress incontinence     Family History  Problem Relation Age of Onset  . Heart disease Father     SOCIAL HISTORY: Social History   Tobacco Use  . Smoking status: Former Smoker    Last attempt to quit: 10/27/2013    Years since quitting: 4.7  . Smokeless tobacco: Never Used  Substance Use Topics  . Alcohol use: No    Alcohol/week: 0.0 standard drinks    Allergies  Allergen Reactions  . Gabapentin Other (See Comments)    Per patient, 300mg  dose made her extremely weak and unable to move. Ok  with lower doses.    Current Outpatient Medications  Medication Sig Dispense Refill  . albuterol (PROVENTIL HFA;VENTOLIN HFA) 108 (90 Base) MCG/ACT inhaler Inhale 2 puffs into the lungs every 6 (six) hours as needed for wheezing or shortness of breath.    Marland Kitchen amLODipine (NORVASC) 5 MG tablet Take 5 mg by mouth daily.    Marland Kitchen aspirin EC 81 MG tablet Take 81 mg by mouth daily.     . Cholecalciferol 1000 units tablet Take 1,000 Units by mouth daily.    Marland Kitchen docusate sodium (COLACE) 100 MG capsule Take 100 mg by mouth daily as needed (constipation).    . ferrous sulfate 325 (65 FE) MG tablet Take 1 tablet (325 mg total) by mouth 2 (two) times daily with a meal. 60 tablet 1  . lactose free nutrition (BOOST) LIQD Take 237 mLs by mouth daily as needed (occasionally).     Marland Kitchen lisinopril (PRINIVIL,ZESTRIL) 10 MG tablet Take 10 mg by mouth daily.    . metFORMIN (GLUCOPHAGE) 500 MG tablet Take 500 mg by mouth daily with breakfast. Reported on 03/14/2016     No current facility-administered medications for this visit.     REVIEW OF SYSTEMS:  [X]  denotes positive finding, [ ]  denotes negative finding Cardiac  Comments:  Chest pain or chest pressure:  Shortness of breath upon exertion:    Short of breath when lying flat:    Irregular heart rhythm:        Vascular    Pain in calf, thigh, or hip brought on by ambulation:    Pain in feet at night that wakes you up from your sleep:     Blood clot in your veins:    Leg swelling:         Pulmonary    Oxygen at home:    Productive cough:     Wheezing:         Neurologic    Sudden weakness in arms or legs:     Sudden numbness in arms or legs:     Sudden onset of difficulty speaking or slurred speech:    Temporary loss of vision in one eye:     Problems with dizziness:         Gastrointestinal    Blood in stool:     Vomited blood:         Genitourinary    Burning when urinating:     Blood in urine:        Psychiatric    Major depression:          Hematologic    Bleeding problems:    Problems with blood clotting too easily:        Skin    Rashes or ulcers:        Constitutional    Fever or chills:     PHYSICAL EXAM:   Vitals:   07/16/18 1256  BP: (!) 152/53  Pulse: 68  Resp: 16  Temp: (!) 97.1 F (36.2 C)  TempSrc: Oral  SpO2: 100%  Weight: 135 lb (61.2 kg)  Height: 5\' 2"  (1.575 m)    GENERAL: The patient is a well-nourished female, in no acute distress. The vital signs are documented above. CARDIAC: There is a regular rate and rhythm.  VASCULAR: She has bilateral carotid bruits. On the left side she has a palpable femoral pulse and palpable graft pulse.  I cannot palpate pedal pulses.  The left foot is warm and well-perfused. PULMONARY: There is good air exchange bilaterally without wheezing or rales. ABDOMEN: Soft and non-tender with normal pitched bowel sounds.  MUSCULOSKELETAL: On the right side she has an above-the-knee amputation. NEUROLOGIC: No focal weakness or paresthesias are detected. SKIN: There are no ulcers or rashes noted. PSYCHIATRIC: The patient has a normal affect.  DATA:    ARTERIAL DOPPLER STUDY: I have independently interpreted her arterial Doppler study today.  On the left side there is a biphasic peroneal and dorsalis pedis signal.  Posterior tibial signal is monophasic.  ABI on the left is 58%.  Toe pressure is 68 mmHg.  Of note she has a right below the knee amputation.  GRAFT DUPLEX: I have independently interpreted her graft duplex.  This patient has a left femoral to dorsalis pedis artery bypass which was done elsewhere.  The bypass graft is patent with increased velocities in the proximal anastomosis extending to the mid segment in the 50 to 70% range.  There is a branch in the mid thigh noted.  MEDICAL ISSUES:   PERIPHERAL VASCULAR DISEASE: Her left femoral to dorsalis pedis artery bypass graft is patent.  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  However she is  asymptomatic and has a biphasic dorsalis pedis signal on the right.  We will continue to follow  her graft closely.  I have ordered a follow-up duplex and ABIs in 1 year and I will see her back at that time.  BILATERAL CAROTID BRUITS: Duplex scan a year ago showed a less than 39% carotid stenosis bilaterally.  I will get a carotid duplex scan when she returns in 1 year.  She is on aspirin.  She is asymptomatic.  She denies any history of stroke, TIAs, expressive or receptive aphasia or amaurosis fugax.  [name] [facility] [phone]

## 2018-11-05 ENCOUNTER — Encounter: Payer: Medicare Other | Admitting: Physical Medicine & Rehabilitation

## 2018-11-12 ENCOUNTER — Encounter: Payer: Medicare Other | Admitting: Physical Medicine & Rehabilitation

## 2018-11-13 ENCOUNTER — Encounter: Payer: Medicare Other | Attending: Physical Medicine & Rehabilitation | Admitting: Physical Medicine & Rehabilitation

## 2018-11-13 ENCOUNTER — Encounter: Payer: Self-pay | Admitting: Physical Medicine & Rehabilitation

## 2018-11-13 ENCOUNTER — Other Ambulatory Visit: Payer: Self-pay

## 2018-11-13 VITALS — BP 157/65 | HR 71 | Ht 62.0 in | Wt 135.0 lb

## 2018-11-13 DIAGNOSIS — I10 Essential (primary) hypertension: Secondary | ICD-10-CM

## 2018-11-13 DIAGNOSIS — Z89611 Acquired absence of right leg above knee: Secondary | ICD-10-CM | POA: Diagnosis present

## 2018-11-13 DIAGNOSIS — R262 Difficulty in walking, not elsewhere classified: Secondary | ICD-10-CM | POA: Diagnosis not present

## 2018-11-13 DIAGNOSIS — E1151 Type 2 diabetes mellitus with diabetic peripheral angiopathy without gangrene: Secondary | ICD-10-CM | POA: Insufficient documentation

## 2018-11-13 DIAGNOSIS — R269 Unspecified abnormalities of gait and mobility: Secondary | ICD-10-CM

## 2018-11-13 DIAGNOSIS — M24561 Contracture, right knee: Secondary | ICD-10-CM | POA: Insufficient documentation

## 2018-11-13 DIAGNOSIS — Z87891 Personal history of nicotine dependence: Secondary | ICD-10-CM | POA: Insufficient documentation

## 2018-11-13 DIAGNOSIS — I272 Pulmonary hypertension, unspecified: Secondary | ICD-10-CM | POA: Insufficient documentation

## 2018-11-13 DIAGNOSIS — I129 Hypertensive chronic kidney disease with stage 1 through stage 4 chronic kidney disease, or unspecified chronic kidney disease: Secondary | ICD-10-CM | POA: Diagnosis not present

## 2018-11-13 DIAGNOSIS — N189 Chronic kidney disease, unspecified: Secondary | ICD-10-CM | POA: Diagnosis not present

## 2018-11-13 DIAGNOSIS — Z8249 Family history of ischemic heart disease and other diseases of the circulatory system: Secondary | ICD-10-CM | POA: Diagnosis not present

## 2018-11-13 DIAGNOSIS — E1122 Type 2 diabetes mellitus with diabetic chronic kidney disease: Secondary | ICD-10-CM | POA: Diagnosis not present

## 2018-11-13 NOTE — Progress Notes (Addendum)
Subjective:    Patient ID: Jessica Abbott, female    DOB: Jul 06, 1941, 78 y.o.   MRN: [id number]  HPI  Female with history of HTN, DMT2, CKD, pulmonary HTN, chronic right knee contracture, PVD presents for follow up for right AKA.    Last clinic visit 07/16/18.  Daughter supplements history.  She states she has been doing excercises.  She continues to follow up with Vascular, notes reviewed, plan for repeat study end of the year.  She notes good fit with prosthesis.  She has completed therapies.  Denies falls. Left heel is stable.  Pain Inventory Average Pain 0 Pain Right Now 0 My pain is no pain  In the last 24 hours, has pain interfered with the following? General activity 0 Relation with others 0 Enjoyment of life 0 What TIME of day is your pain at its worst? no pain Sleep (in general) Good  Pain is worse with: no pain Pain improves with: no pain Relief from Meds: na  Mobility walk with assistance use a walker how many minutes can you walk? 120 ability to climb steps?  no do you drive?  no  Function retired  Neuro/Psych trouble walking  Prior Studies Any changes since last visit?  no  Physicians involved in your care Any changes since last visit?  no Vascular- Dr. Doren Custard   Family History  Problem Relation Age of Onset  . Heart disease Father    Social History   Socioeconomic History  . Marital status: Married    Spouse name: Not on file  . Number of children: Not on file  . Years of education: Not on file  . Highest education level: Not on file  Occupational History  . Not on file  Social Needs  . Financial resource strain: Not on file  . Food insecurity:    Worry: Not on file    Inability: Not on file  . Transportation needs:    Medical: Not on file    Non-medical: Not on file  Tobacco Use  . Smoking status: Former Smoker    Last attempt to quit: 10/27/2013    Years since quitting: 5.0  . Smokeless tobacco: Never Used  Substance and Sexual  Activity  . Alcohol use: No    Alcohol/week: 0.0 standard drinks  . Drug use: No  . Sexual activity: Not on file  Lifestyle  . Physical activity:    Days per week: Not on file    Minutes per session: Not on file  . Stress: Not on file  Relationships  . Social connections:    Talks on phone: Not on file    Gets together: Not on file    Attends religious service: Not on file    Active member of club or organization: Not on file    Attends meetings of clubs or organizations: Not on file    Relationship status: Not on file  Other Topics Concern  . Not on file  Social History Narrative  . Not on file   Past Surgical History:  Procedure Laterality Date  . ABDOMINAL AORTOGRAM W/LOWER EXTREMITY N/A 10/22/2016   Procedure: Abdominal Aortogram w/Lower Extremity;  Surgeon: Angelia Mould, MD;  Location: [facility];  Service: Cardiovascular;  Laterality: N/A;  lt leg  . ABDOMINAL HYSTERECTOMY    . AMPUTATION Right 12/23/2015   Procedure: RIGHT  ABOVE KNEE AMPUTATION;  Surgeon: Angelia Mould, MD;  Location: [facility];  Service: Vascular;  Laterality: Right;  .  BACK SURGERY  2002  . COLONOSCOPY N/A 11/24/2015   Procedure: COLONOSCOPY;  Surgeon: Ronald Lobo, MD;  Location: [facility] ENDOSCOPY;  Service: Endoscopy;  Laterality: N/A;  . ESOPHAGOGASTRODUODENOSCOPY (EGD) WITH PROPOFOL N/A 11/23/2015   Procedure: ESOPHAGOGASTRODUODENOSCOPY (EGD) WITH PROPOFOL;  Surgeon: Ronald Lobo, MD;  Location[facility] ENDOSCOPY;  Service: Endoscopy;  Laterality: N/A;  . femoral artery arteriogram  04/20/2015  . GIVENS CAPSULE STUDY N/A 11/26/2015   Procedure: GIVENS CAPSULE STUDY;  Surgeon: Ronald Lobo, MD;  Location: [facility] ENDOSCOPY;  Service: Endoscopy;  Laterality: N/A;   Past Medical History:  Diagnosis Date  . Anemia   . Aortic regurgitation    moderate AR by 11/25/15 echo  . Arthritis   . Chronic kidney disease   . Diabetes mellitus without complication ([place])   . History of blood transfusion     . History of bronchitis   . History of pneumonia   . Hypertension   . Peripheral arterial disease ([place])   . Peripheral vascular disease ([place])   . Pulmonary hypertension ([place])    03/13/02 echo: PA systolic pressure severely increaed, PA peak pressure 66 mm Hg.   . S/P AKA (above knee amputation) unilateral ([place]) 12/26/2015  . SOB (shortness of breath)   . Stress incontinence    BP (!) 157/65   Pulse 71   Ht 5\' 2"  (1.575 m)   Wt 135 lb (61.2 kg)   SpO2 95%   BMI 24.69 kg/m   Opioid Risk Score:   Fall Risk Score:  `1  Depression screen PHQ 2/9  Depression screen [facility] 2/9 11/13/2018 04/12/2016 01/11/2016  Decreased Interest 0 0 0  Down, Depressed, Hopeless 0 0 0  PHQ - 2 Score 0 0 0  Altered sleeping - - 1  Tired, decreased energy - - 0  Change in appetite - - 0  Feeling bad or failure about yourself  - - 0  Trouble concentrating - - 0  Moving slowly or fidgety/restless - - 0  Suicidal thoughts - - 0  PHQ-9 Score - - 1    Review of Systems  Constitutional: Negative.   HENT: Negative.   Eyes: Negative.   Respiratory: Negative.   Cardiovascular: Negative.   Gastrointestinal: Negative.   Endocrine: Negative.   Musculoskeletal: Positive for gait problem.  Skin: Negative.   Allergic/Immunologic: Negative.   Hematological: Negative.   Psychiatric/Behavioral: Negative.   All other systems reviewed and are negative.     Objective:   Physical Exam Constitutional: She appears well-developed and well-nourished. NAD. HENT: Normocephalic and atraumatic.  Eyes: EOMI. NO discharge.  Cardiovascular: RRR. No JVD. Respiratory: Effort normal and breath sounds normal.  GI: She exhibits no distension. There is no tenderness.  Musculoskeletal: She exhibits no edema and no tenderness.  R-AKA well shaped, healed Gait: Pronounced heel strike on right, however, limited interference in ambulaiton Neurological: She is alert and oriented.  Motor: B/l UE 5/5 LLE hip flexion 5/5, knee  extension 5/5, ankle dorsi/plantar flexion 5/5 Right hip flexion 5/5   Skin: Right AKA healed Psychiatric: Her speech is normal. Her affect is normal.    Assessment & Plan:  Female with history of HTN, DMT2, CKD, pulmonary HTN, chronic right knee contracture, PVD presents for follow up for right AKA.   1. S/p Right AKA with prosthesis  Cont HEP  Cont to follow up with Vasc surgery  New prosthesis fitting well  2. Abnormality of gait  Cont walker  See #1  3. HTN  Elevated today  States she did not take AM meds

## 2019-09-29 ENCOUNTER — Other Ambulatory Visit: Payer: Self-pay

## 2019-09-29 ENCOUNTER — Telehealth (HOSPITAL_COMMUNITY): Payer: Self-pay

## 2019-09-29 DIAGNOSIS — I6529 Occlusion and stenosis of unspecified carotid artery: Secondary | ICD-10-CM

## 2019-09-29 DIAGNOSIS — I779 Disorder of arteries and arterioles, unspecified: Secondary | ICD-10-CM

## 2019-09-29 NOTE — Addendum Note (Signed)
Addended by: York Cerise C on: 09/29/2019 03:03 PM   Modules accepted: Orders

## 2019-09-29 NOTE — Telephone Encounter (Signed)

## 2019-09-30 ENCOUNTER — Ambulatory Visit (INDEPENDENT_AMBULATORY_CARE_PROVIDER_SITE_OTHER)
Admission: RE | Admit: 2019-09-30 | Discharge: 2019-09-30 | Disposition: A | Discharge status: Home / Self Care | Payer: Medicare Other | Source: Ambulatory Visit | Attending: Vascular Surgery | Admitting: Vascular Surgery

## 2019-09-30 ENCOUNTER — Encounter: Payer: Self-pay | Admitting: Vascular Surgery

## 2019-09-30 ENCOUNTER — Other Ambulatory Visit: Payer: Self-pay | Admitting: *Deleted

## 2019-09-30 ENCOUNTER — Ambulatory Visit (INDEPENDENT_AMBULATORY_CARE_PROVIDER_SITE_OTHER): Payer: Medicare Other | Admitting: Vascular Surgery

## 2019-09-30 ENCOUNTER — Ambulatory Visit (HOSPITAL_COMMUNITY)
Admission: RE | Admit: 2019-09-30 | Discharge: 2019-09-30 | Disposition: A | Discharge status: Home / Self Care | Payer: Medicare Other | Source: Ambulatory Visit | Attending: Vascular Surgery | Admitting: Vascular Surgery

## 2019-09-30 ENCOUNTER — Other Ambulatory Visit: Payer: Self-pay

## 2019-09-30 VITALS — BP 148/58 | HR 76 | Temp 98.0°F | Resp 20 | Ht 62.0 in | Wt 135.0 lb

## 2019-09-30 DIAGNOSIS — I779 Disorder of arteries and arterioles, unspecified: Secondary | ICD-10-CM | POA: Insufficient documentation

## 2019-09-30 DIAGNOSIS — I6529 Occlusion and stenosis of unspecified carotid artery: Secondary | ICD-10-CM | POA: Insufficient documentation

## 2019-09-30 DIAGNOSIS — R0989 Other specified symptoms and signs involving the circulatory and respiratory systems: Secondary | ICD-10-CM | POA: Diagnosis not present

## 2019-09-30 NOTE — Progress Notes (Signed)
Patient name: Jessica Abbott MRN: [id number] DOB: 04/06/1941 Sex: female  REASON FOR VISIT:   Follow-up of peripheral vascular disease.  HPI:   Jessica Abbott is a pleasant 79 y.o. female who is undergone a previous left femoral to dorsalis pedis bypass graft that was done in situ and was done at [facility] about 3 years ago.  I have been following a small fistula in the mid thigh and a large fistula at the level of the knee.  Of note she has a previous right above-the-knee amputation.  The patient does ambulate with a walker using her prosthesis for her right above-the-knee amputation.  She denies any claudication on the left although I suspect her activity is fairly limited.  She denies any history of rest pain or nonhealing ulcers.  She also has a history of bilateral carotid bruits and we did obtain a carotid duplex scan at this visit also.  She has no history of stroke, TIAs, expressive or receptive aphasia, or amaurosis fugax.  She is on aspirin and is on a statin.   Past Medical History:  Diagnosis Date  . Anemia   . Aortic regurgitation    moderate AR by 11/25/15 echo  . Arthritis   . Chronic kidney disease   . Diabetes mellitus without complication ([place])   . History of blood transfusion   . History of bronchitis   . History of pneumonia   . Hypertension   . Peripheral arterial disease ([place])   . Peripheral vascular disease ([place])   . Pulmonary hypertension ([place])    [id number] echo: PA systolic pressure severely increaed, PA peak pressure 66 mm Hg.   . 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 SOB (shortness of breath)   . Stress incontinence     Family History  Problem Relation Age of Onset  . Heart disease Father     SOCIAL HISTORY: Social History   Tobacco Use  . Smoking status: Former Smoker    Quit date: 10/27/2013    Years since quitting: 5.9  . Smokeless tobacco: Never Used  Substance Use Topics  . Alcohol use: No   Alcohol/week: 0.0 standard drinks    Allergies  Allergen Reactions  . Gabapentin Other (See Comments)    Per patient, 300mg  dose made her extremely weak and unable to move. Ok with lower doses.    Current Outpatient Medications  Medication Sig Dispense Refill  . albuterol (PROVENTIL HFA;VENTOLIN HFA) 108 (90 Base) MCG/ACT inhaler Inhale 2 puffs into the lungs every 6 (six) hours as needed for wheezing or shortness of breath.    Marland Kitchen amLODipine (NORVASC) 5 MG tablet Take 5 mg by mouth daily.    Marland Kitchen aspirin EC 81 MG tablet Take 81 mg by mouth daily.     Marland Kitchen atorvastatin (LIPITOR) 10 MG tablet TAKE ONE TABLET BY MOUTH ONE TIME DAILY    . Cholecalciferol 1000 units tablet Take 1,000 Units by mouth daily.    Marland Kitchen docusate sodium (COLACE) 100 MG capsule Take 100 mg by mouth daily as needed (constipation).    . ferrous sulfate 325 (65 FE) MG tablet Take 1 tablet (325 mg total) by mouth 2 (two) times daily with a meal. 60 tablet 1  . hydrochlorothiazide (MICROZIDE) 12.5 MG capsule Take by mouth.    . lactose free nutrition (BOOST) LIQD Take 237 mLs by mouth daily as needed (occasionally).     Marland Kitchen lisinopril (PRINIVIL,ZESTRIL) 10 MG tablet Take 10 mg by mouth daily.    Marland Kitchen  metFORMIN (GLUCOPHAGE) 500 MG tablet Take 500 mg by mouth daily with breakfast. Reported on 03/14/2016     No current facility-administered medications for this visit.    REVIEW OF SYSTEMS:  [X]  denotes positive finding, [ ]  denotes negative finding Cardiac  Comments:  Chest pain or chest pressure:    Shortness of breath upon exertion:    Short of breath when lying flat:    Irregular heart rhythm:        Vascular    Pain in calf, thigh, or hip brought on by ambulation:    Pain in feet at night that wakes you up from your sleep:     Blood clot in your veins:    Leg swelling:         Pulmonary    Oxygen at home:    Productive cough:     Wheezing:         Neurologic    Sudden weakness in arms or legs:     Sudden numbness in  arms or legs:     Sudden onset of difficulty speaking or slurred speech:    Temporary loss of vision in one eye:     Problems with dizziness:         Gastrointestinal    Blood in stool:     Vomited blood:         Genitourinary    Burning when urinating:     Blood in urine:        Psychiatric    Major depression:         Hematologic    Bleeding problems:    Problems with blood clotting too easily:        Skin    Rashes or ulcers:        Constitutional    Fever or chills:     PHYSICAL EXAM:   Vitals:   09/30/19 1241  Weight: 135 lb (61.2 kg)  Height: 5\' 2"  (1.575 m)    GENERAL: The patient is a well-nourished female, in no acute distress. The vital signs are documented above. CARDIAC: There is a regular rate and rhythm.  VASCULAR: She has bilateral carotid bruits. She has a right above-the-knee amputation.  On the left side she has a palpable graft pulse just above her ankle. PULMONARY: There is good air exchange bilaterally without wheezing or rales. ABDOMEN: Soft and non-tender with normal pitched bowel sounds.  MUSCULOSKELETAL: There are no major deformities or cyanosis. NEUROLOGIC: No focal weakness or paresthesias are detected. SKIN: There are no ulcers or rashes noted. PSYCHIATRIC: The patient has a normal affect.  DATA:    GRAFT DUPLEX: I have independently interpreted her graft duplex scan today.  The patient has a left femoral to dorsalis pedis bypass that was done in 2016.  The bypass graft on the left is noted to be patent with increased velocities within the proximal and mid graft segments in the greater than 50% range.  There is also inflow disease suggesting a greater than 50% stenosis.  Of note there is biphasic flow at the distal anastomosis and in the outflow artery.  ARTERIAL DOPPLER STUDY: I have independently interpreted her arterial Doppler study today.  The patient has a BKA on the right.  On the left side there is a monophasic dorsalis pedis and  posterior tibial signal.  ABI is 46%.  Toe pressures 46 mmHg.  CAROTID DUPLEX: I have independently interpreted her carotid duplex scan today.  On the right side  there is a less than 39% stenosis.  The right vertebral artery is patent with antegrade flow.  On the left side there is a less than 39% carotid stenosis.  The left vertebral artery is patent with antegrade flow.   MEDICAL ISSUES:   PERIPHERAL VASCULAR DISEASE: The patient has an easily palpable graft pulse at the ankle.  Although she has some areas of stenosis within the graft noted and some drop in her ABI given her age I would favor a conservative approach and we would not consider arteriography unless the stenoses progress or she develops symptoms.  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  These AV fistulas have not increased in size.  Rather than wait a whole year I think it would be safest to obtain a follow-up study in 9 months.  I ordered ABIs in 9 months and a graft duplex in 9 months.  If this stenosis progressed I think we will need to consider arteriography and possibly ligating her fistulas.  BILATERAL CAROTID BRUITS: Patient has bilateral carotid bruits.  Carotid duplex scan shows no evidence of significant carotid disease.  I do not think she needs routine follow-up carotid duplex scans.  She is on aspirin and is on a statin.  She is asymptomatic.  [name] [facility] [phone]

## 2019-10-01 ENCOUNTER — Other Ambulatory Visit: Payer: Self-pay | Admitting: *Deleted

## 2019-10-01 DIAGNOSIS — I779 Disorder of arteries and arterioles, unspecified: Secondary | ICD-10-CM

## 2019-11-13 ENCOUNTER — Ambulatory Visit: Payer: Medicare Other | Admitting: Physical Medicine & Rehabilitation

## 2019-11-16 ENCOUNTER — Encounter: Payer: Self-pay | Admitting: Physical Medicine & Rehabilitation

## 2019-11-16 ENCOUNTER — Encounter: Payer: Medicare Other | Attending: Physical Medicine & Rehabilitation | Admitting: Physical Medicine & Rehabilitation

## 2019-11-16 ENCOUNTER — Other Ambulatory Visit: Payer: Self-pay

## 2019-11-16 VITALS — BP 174/68 | HR 81 | Temp 97.7°F | Ht 62.0 in | Wt 143.8 lb

## 2019-11-16 DIAGNOSIS — I779 Disorder of arteries and arterioles, unspecified: Secondary | ICD-10-CM

## 2019-11-16 DIAGNOSIS — I10 Essential (primary) hypertension: Secondary | ICD-10-CM | POA: Insufficient documentation

## 2019-11-16 DIAGNOSIS — R269 Unspecified abnormalities of gait and mobility: Secondary | ICD-10-CM | POA: Diagnosis present

## 2019-11-16 DIAGNOSIS — Z89611 Acquired absence of right leg above knee: Secondary | ICD-10-CM | POA: Insufficient documentation

## 2019-11-16 NOTE — Progress Notes (Signed)
Subjective:    Patient ID: Jessica Abbott, female    DOB: 05/12/1941, 79 y.o.   MRN: [id number]  HPI  Female with history of HTN, DMT2, CKD, pulmonary HTN, chronic right knee contracture, PVD presents for follow up for right AKA.    Last clinic visit on 11/13/2018.  Since that time, patient saw vascular surgery plans for follow-up in 9 months with further work-up-notes reviewed.  Patient states, she continues to do HEP.  Denies falls. She continues to use rolling walker.  BP remains elevated.   Pain Inventory Average Pain 0 Pain Right Now 0 My pain is no pain  In the last 24 hours, has pain interfered with the following? General activity 2 Relation with others 0 Enjoyment of life 1 What TIME of day is your pain at its worst? no pain Sleep (in general) Good  Pain is worse with: no pain Pain improves with: no pain Relief from Meds: na  Mobility walk with assistance use a walker how many minutes can you walk? 120 ability to climb steps?  no do you drive?  no  Function retired  Neuro/Psych bladder control problems trouble walking  Prior Studies Any changes since last visit?  no  Physicians involved in your care Primary care .   Family History  Problem Relation Age of Onset  . Heart disease Father    Social History   Socioeconomic History  . Marital status: Married    Spouse name: Not on file  . Number of children: Not on file  . Years of education: Not on file  . Highest education level: Not on file  Occupational History  . Not on file  Tobacco Use  . Smoking status: Former Smoker    Quit date: 10/27/2013    Years since quitting: 6.0  . Smokeless tobacco: Never Used  Substance and Sexual Activity  . Alcohol use: No    Alcohol/week: 0.0 standard drinks  . Drug use: No  . Sexual activity: Not on file  Other Topics Concern  . Not on file  Social History Narrative  . Not on file   Social Determinants of Health   Financial Resource Strain:   .  Difficulty of Paying Living Expenses: Not on file  Food Insecurity:   . Worried About Charity fundraiser in the Last Year: Not on file  . Ran Out of Food in the Last Year: Not on file  Transportation Needs:   . Lack of Transportation (Medical): Not on file  . Lack of Transportation (Non-Medical): Not on file  Physical Activity:   . Days of Exercise per Week: Not on file  . Minutes of Exercise per Session: Not on file  Stress:   . Feeling of Stress : Not on file  Social Connections:   . Frequency of Communication with Friends and Family: Not on file  . Frequency of Social Gatherings with Friends and Family: Not on file  . Attends Religious Services: Not on file  . Active Member of Clubs or Organizations: Not on file  . Attends Archivist Meetings: Not on file  . Marital Status: Not on file   Past Surgical History:  Procedure Laterality Date  . ABDOMINAL AORTOGRAM W/LOWER EXTREMITY N/A 10/22/2016   Procedure: Abdominal Aortogram w/Lower Extremity;  Surgeon: Angelia Mould, MD;  Location: [facility];  Service: Cardiovascular;  Laterality: N/A;  lt leg  . ABDOMINAL HYSTERECTOMY    . AMPUTATION Right 12/23/2015   Procedure: RIGHT  ABOVE KNEE AMPUTATION;  Surgeon: Angelia Mould, MD;  Location: [facility];  Service: Vascular;  Laterality: Right;  . BACK SURGERY  2002  . COLONOSCOPY N/A 11/24/2015   Procedure: COLONOSCOPY;  Surgeon: Ronald Lobo, MD;  Location: [facility];  Service: Endoscopy;  Laterality: N/A;  . ESOPHAGOGASTRODUODENOSCOPY (EGD) WITH PROPOFOL N/A 11/23/2015   Procedure: ESOPHAGOGASTRODUODENOSCOPY (EGD) WITH PROPOFOL;  Surgeon: Ronald Lobo, MD;  Location: [facility] ENDOSCOPY;  Service: Endoscopy;  Laterality: N/A;  . femoral artery arteriogram  04/20/2015  . GIVENS CAPSULE STUDY N/A 11/26/2015   Procedure: GIVENS CAPSULE STUDY;  Surgeon: Ronald Lobo, MD;  Location: [facility] ENDOSCOPY;  Service: Endoscopy;  Laterality: N/A;   Past Medical History:    Diagnosis Date  . Anemia   . Aortic regurgitation    moderate AR by 11/25/15 echo  . Arthritis   . Chronic kidney disease   . Diabetes mellitus without complication ([place])   . History of blood transfusion   . History of bronchitis   . History of pneumonia   . Hypertension   . Peripheral arterial disease ([place])   . Peripheral vascular disease ([place])   . Pulmonary hypertension ([place])    Q000111Q echo: PA systolic pressure severely increaed, PA peak pressure 66 mm Hg.   . 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 SOB (shortness of breath)   . Stress incontinence    BP (!) 174/68   Pulse 81   Temp 97.7 F (36.5 C)   Ht 5\' 2"  (1.575 m)   Wt 143 lb 12.8 oz (65.2 kg)   SpO2 97%   BMI 26.30 kg/m   Opioid Risk Score:   Fall Risk Score:  `1  Depression screen PHQ 2/9  Depression screen [facility] 2/9 11/13/2018 04/12/2016 01/11/2016  Decreased Interest 0 0 0  Down, Depressed, Hopeless 0 0 0  PHQ - 2 Score 0 0 0  Altered sleeping - - 1  Tired, decreased energy - - 0  Change in appetite - - 0  Feeling bad or failure about yourself  - - 0  Trouble concentrating - - 0  Moving slowly or fidgety/restless - - 0  Suicidal thoughts - - 0  PHQ-9 Score - - 1    Review of Systems  Constitutional: Negative.   HENT: Negative.   Eyes: Negative.   Respiratory: Negative.   Cardiovascular: Negative.   Gastrointestinal: Negative.   Endocrine: Negative.   Musculoskeletal: Positive for gait problem.  Skin: Negative.   Allergic/Immunologic: Negative.   Hematological: Negative.   Psychiatric/Behavioral: Negative.       Objective:   Physical Exam Constitutional: NAD. Musculoskeletal:  R-AKA healed Gait: Pronounced heel strike on right with forceful leg extension Neurological: Alert Motor: Right hip flexion 5/5   Skin: Right AKA healed    Assessment & Plan:  Female with history of HTN, DMT2, CKD, pulmonary HTN, chronic right knee contracture, PVD presents for follow up  for right AKA.   1. S/p Right AKA with prosthesis  Cont HEP  Will refer to PT  Cont to follow up with Vasc surgery  Cont prosthesis   Will plan to refer back to [facility] after some therapy sessions to optimize gait  2. Abnormality of gait  Cont walker  See #1  3. HTN  Elevated today  States she did not take AM meds because she was in a rush

## 2019-12-15 ENCOUNTER — Encounter: Payer: Medicare Other | Admitting: Physical Medicine & Rehabilitation

## 2019-12-23 ENCOUNTER — Encounter: Payer: Self-pay | Admitting: Physical Therapy

## 2019-12-23 ENCOUNTER — Other Ambulatory Visit: Payer: Self-pay

## 2019-12-23 ENCOUNTER — Ambulatory Visit: Payer: Medicare Other | Attending: Physical Medicine & Rehabilitation | Admitting: Physical Therapy

## 2019-12-23 ENCOUNTER — Telehealth: Payer: Self-pay | Admitting: Physical Therapy

## 2019-12-23 DIAGNOSIS — R293 Abnormal posture: Secondary | ICD-10-CM

## 2019-12-23 DIAGNOSIS — M25652 Stiffness of left hip, not elsewhere classified: Secondary | ICD-10-CM

## 2019-12-23 DIAGNOSIS — R531 Weakness: Secondary | ICD-10-CM

## 2019-12-23 DIAGNOSIS — R2681 Unsteadiness on feet: Secondary | ICD-10-CM

## 2019-12-23 DIAGNOSIS — R2689 Other abnormalities of gait and mobility: Secondary | ICD-10-CM

## 2019-12-23 DIAGNOSIS — M25651 Stiffness of right hip, not elsewhere classified: Secondary | ICD-10-CM

## 2019-12-23 NOTE — Therapy (Signed)
[facility] [address] [place], [place], [address] Phone: [phone]   Fax:  [phone]  Physical Therapy Evaluation  Patient Details  Name: Jessica Abbott MRN: [id number] Date of Birth: [birth year] Referring Provider (PT): [name], MD   Encounter Date: 12/23/2019  PT End of Session - 12/23/19 2303    Visit Number  1    Number of Visits  25    Date for PT Re-Evaluation  03/21/20    Authorization Type  Medicare A&B    PT Start Time  1315    PT Stop Time  1400    PT Time Calculation (min)  45 min    Equipment Utilized During Treatment  Gait belt    Activity Tolerance  Patient tolerated treatment well    Behavior During Therapy  [facility] for tasks assessed/performed       Past Medical History:  Diagnosis Date  . Anemia   . Aortic regurgitation    moderate AR by 11/25/15 echo  . Arthritis   . Chronic kidney disease   . Diabetes mellitus without complication ([place])   . History of blood transfusion   . History of bronchitis   . History of pneumonia   . Hypertension   . Peripheral arterial disease ([place])   . Peripheral vascular disease ([place])   . Pulmonary hypertension ([place])    Q000111Q echo: PA systolic pressure severely increaed, PA peak pressure 66 mm Hg.   . 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 SOB (shortness of breath)   . Stress incontinence     Past Surgical History:  Procedure Laterality Date  . ABDOMINAL AORTOGRAM W/LOWER EXTREMITY N/A 10/22/2016   Procedure: Abdominal Aortogram w/Lower Extremity;  Surgeon: Angelia Mould, MD;  Location: [facility];  Service: Cardiovascular;  Laterality: N/A;  lt leg  . ABDOMINAL HYSTERECTOMY    . AMPUTATION Right 12/23/2015   Procedure: RIGHT  ABOVE KNEE AMPUTATION;  Surgeon: Angelia Mould, MD;  Location: [facility];  Service: Vascular;  Laterality: Right;  . BACK SURGERY  2002  . COLONOSCOPY N/A 11/24/2015   Procedure:  COLONOSCOPY;  Surgeon: Ronald Lobo, MD;  Location: [facility] ENDOSCOPY;  Service: Endoscopy;  Laterality: N/A;  . ESOPHAGOGASTRODUODENOSCOPY (EGD) WITH PROPOFOL N/A 11/23/2015   Procedure: ESOPHAGOGASTRODUODENOSCOPY (EGD) WITH PROPOFOL;  Surgeon: Ronald Lobo, MD;  Location: [facility] ENDOSCOPY;  Service: Endoscopy;  Laterality: N/A;  . femoral artery arteriogram  04/20/2015  . GIVENS CAPSULE STUDY N/A 11/26/2015   Procedure: GIVENS CAPSULE STUDY;  Surgeon: Ronald Lobo, MD;  Location: [facility] ENDOSCOPY;  Service: Endoscopy;  Laterality: N/A;    There were no vitals filed for this visit.   Subjective Assessment - 12/23/19 1322    Subjective  This 79yo female was referred on 11/16/2019 to PT by [name], MD s/p AKA. She underwent a right Transfemoral Amputation on 12/23/2015. Her prosthesis was delivered on 03/07/2016, socket revision on 04/17/2018 & last seen by prosthetist 09/02/2018.    Patient Stated Goals  She wants to be able to walk more even with new prosthesis.    Currently in Pain?  No/denies         [facility] PT Assessment - 12/23/19 1315      Assessment   Medical Diagnosis  Right Transfemoral Amputation    Referring Provider (PT)  [name], MD    Onset Date/Surgical Date  11/16/19   MD referral to PT   Hand Dominance  Right  Prior Therapy  none in last couple years      Precautions   Precautions  Fall      Balance Screen   Has the patient fallen in the past 6 months  No    Has the patient had a decrease in activity level because of a fear of falling?   No    Is the patient reluctant to leave their home because of a fear of falling?   No      Home Environment   Living Environment  Private residence    Living Arrangements  Spouse/significant other   children are there a lot, husband requires 24hr care   Type of [place] to enter    Entrance Stairs-Number of Steps  1    [place]  One level    [place] - 2 wheels;Walker - 4 wheels;Cane - single point;Bedside commode;Tub bench;Shower seat;Grab bars - tub/shower;Wheelchair - manual      Prior Function   Level of Independence  Independent with household mobility with device;Independent with community mobility with device    Vocation  Retired    Leisure  flowers, church, family      Posture/Postural Control   Posture/Postural Control  Postural limitations    Postural Limitations  Rounded Shoulders;Forward head;Flexed trunk;Weight shift left      ROM / Strength   AROM / PROM / Strength  AROM;Strength      AROM   Overall AROM   Deficits    Overall AROM Comments  standing Hip extension -25* bilaterally      Strength   Overall Strength  Deficits    Overall Strength Comments  Gross testing in sitting:  right hip flex 3/5, abd & ext 3-/5,  left hip flexion 4/5, abd & ext 3/5, left knee extension /flexion 4/5, ankle DF 5/5      Transfers   Transfers  Sit to Stand;Stand to Sit    Sit to Stand  6: Modified independent (Device/Increase time);With upper extremity assist;With armrests;From chair/3-in-1   requires RW to stabilize   Stand to Sit  6: Modified independent (Device/Increase time);With upper extremity assist;With armrests;To chair/3-in-1   requires RW for stability     Ambulation/Gait   Ambulation/Gait  Yes    Ambulation/Gait Assistance  5: Supervision;4: Min assist    Ambulation/Gait Assistance Details  excessive BUE weight bearing on RW, caught prosthetic toe stumbline requiring minA to prevent fall.     Ambulation Distance (Feet)  50 Feet    Assistive device  Rolling walker;Prosthesis    Gait Pattern  Step-to pattern;Decreased step length - left;Decreased stance time - right;Decreased hip/knee flexion - right;Decreased weight shift to right;Right hip hike;Left flexed knee in stance;Antalgic;Lateral hip instability;Trunk flexed    Ambulation Surface  Level;Indoor    Gait velocity  0.81 ft/sec      Standardized Balance  Assessment   Standardized Balance Assessment  Berg Balance Test      Berg Balance Test   Sit to Stand  Needs minimal aid to stand or to stabilize   requires RW to stabilize, with RW = 3   Standing Unsupported  Unable to stand 30 seconds unassisted   with RW = 4   Sitting with Back Unsupported but Feet Supported on Floor or Stool  Able to sit safely and securely 2 minutes    Stand to Sit  Controls descent by  using hands   with RW = 3   Transfers  Able to transfer safely, definite need of hands    Standing Unsupported with Eyes Closed  Needs help to keep from falling   with RW =2   Standing Unsupported with Feet Together  Needs help to attain position and unable to hold for 15 seconds   with RW = 2   From Standing, Reach Forward with Outstretched Arm  Loses balance while trying/requires external support   with RW = 1   From Standing Position, Pick up Object from Floor  Unable to try/needs assist to keep balance   with RW =2   From Standing Position, Turn to Look Behind Over each Shoulder  Needs assist to keep from losing balance and falling   with RW = 1   Turn 360 Degrees  Needs assistance while turning   with RW = 0   Standing Unsupported, Alternately Place Feet on Step/Stool  Needs assistance to keep from falling or unable to try    Standing Unsupported, One Foot in [employer] balance while stepping or standing   with RW = 2   Standing on One Leg  Unable to try or needs assist to prevent fall   with RW = 1   Total Score  11    Berg commentMerrilee Jansky Tasks with RW 28/56      Prosthetics Assessment - 12/23/19 [place] with  Skin check;Prosthetic cleaning    Prosthetic Care Dependent with  Residual limb care;Correct ply sock adjustment;Proper wear schedule/adjustment;Proper weight-bearing schedule/adjustment    Prosthetic Care Comments   current prosthesis with liner & shoe = 7.8#  preceived weight ~5x (35-40#)      Donning prosthesis    Supervision    Doffing prosthesis   Modified independent (Device/Increase time)    Current prosthetic wear tolerance (days/week)   3 days/wk    Current prosthetic wear tolerance (#hours/day)   3-5 hours   limits as she is tired & prosthesis is heavy   Current prosthetic weight-bearing tolerance (hours/day)   No report of pain or discomfort with standing 5 minutes    Edema  pitting edema    Residual limb condition   no open areas, medial proximal area is darkened, dry skin, normal temperature,     Prosthesis Description  silicon liner with velcro lanyard strap suspension, Medi single axis friction engaging knee, single axis foot.      K code/activity level with prosthetic use   K2 limited community with fixed cadence               Objective measurements completed on examination: See above findings.      [facility] Adult PT Treatment/Exercise - 12/23/19 1315      Prosthetics   Education Provided  Skin check;Proper Donning    Person(s) Educated  Patient;Child(ren)    Education Method  Explanation;Verbal cues    Education Method  Verbalized understanding;Needs further instruction;Verbal cues required               PT Short Term Goals - 12/23/19 2327      PT SHORT TERM GOAL #1   Title  Patient demonstrates proper donning including tighten suspension in standing. (All STGs 4 weeks PT after recieving new prosthesis)    Time  4    Period  Weeks    Status  New      PT SHORT TERM  GOAL #2   Title  Patient reports prosthesis wear >/= 5 days /wk for >8 hours total /day (All STGs 4 weeks PT after recieving new prosthesis)    Time  4    Period  Weeks    Status  New      PT SHORT TERM GOAL #3   Title  Patient able to reach 10" anteriorly & to floor with RW support with prosthesis with supervision. (All STGs 4 weeks PT after recieving new prosthesis)    Time  4    Period  Weeks    Status  New      PT SHORT TERM GOAL #4   Title  Patient ambuates 64' with RW & prosthesis with  supervision. (All STGs 4 weeks PT after recieving new prosthesis)    Time  4    Period  Weeks    Status  New        PT Long Term Goals - 12/23/19 2321      PT LONG TERM GOAL #1   Title  Patient verbalizes & demonstrates proper prosthetic care & use.    Time  12    Period  Weeks    Status  New    Target Date  03/21/20      PT LONG TERM GOAL #2   Title  Patient tolerates prosthesis wear >75% of awake hours to enable function during more of her awake hours.    Time  12    Period  Weeks    Status  New    Target Date  03/21/20      PT LONG TERM GOAL #3   Title  Patient performs tasks of Oceanographer test with RW support >45/56    Time  12    Period  Weeks    Status  New    Target Date  03/21/20      PT LONG TERM GOAL #4   Title  Patient ambulates 100' around furniture with RW & prosthesis modified independent for household moblity.    Time  12    Period  Weeks    Status  New    Target Date  03/21/20      PT LONG TERM GOAL #5   Title  Patient ambulates 250' with RW & prosthesis with family supervision    Time  46    Period  Weeks    Status  New    Target Date  03/21/20      Additional Long Term Goals   Additional Long Term Goals  Yes      PT LONG TERM GOAL #6   Title  Patient negotiates ramps & curbs with RW & prosthesis with family assistance for community access    Time  12    Period  Weeks    Status  New    Target Date  03/21/20             Plan - 12/23/19 2307    Clinical Impression Statement  This 79yo female underwent a right Transfemoral Amputation 12/23/2015. She recieved a prosthesis 03/07/2016 and socket revision 04/17/2018. The prosthesis is ill-fitting make it feel "heavy" She limits wear due to heavy feeling fatigues her. She is w/c bound when no prosthesis and significantly limited with RW & prosthesis. She only wears prosthesis ~3 days/wk for 3-5 hours.  Her balance is impaired with high fall risk of falls. Tasks of [employer] test with RW support  28/56. She ambulates with  RW & prosthesis with significant gait deviations including excessive UE weight bearing.  Patient would benefit from a new prosthesis that is lighter & better fitted. She needs skilled instruction to increase wear time to most of awake hours to enable potential for more mobility. She would benefit from skilled PT to improve balance & gait once she recieves new prosthesis.    Personal Factors and Comorbidities  Comorbidity 3+;Age;Fitness;Past/Current Experience;Time since onset of injury/illness/exacerbation    Comorbidities  Rt TFA, DM2, CKD, HTN, PVD    Examination-Activity Limitations  Locomotion Level;Stairs;Stand;Transfers    Examination-Participation Restrictions  Church;Community Activity    Stability/Clinical Decision Making  Evolving/Moderate complexity    Clinical Decision Making  Moderate    Rehab Potential  Good    PT Frequency  2x / week    PT Duration  12 weeks    PT Treatment/Interventions  ADLs/Self Care Home Management;DME Instruction;Gait training;Stair training;Neuromuscular re-education;Functional mobility training;Balance training;Therapeutic exercise;Therapeutic activities;Patient/family education;Prosthetic Training;Vestibular    PT Next Visit Plan  hold PT until recieves new prosthesis, then 2x/wk for 12 weeks, reassess balnce & gait, prosthetic review       Patient will benefit from skilled therapeutic intervention in order to improve the following deficits and impairments:  Abnormal gait, Decreased activity tolerance, Decreased balance, Decreased endurance, Decreased knowledge of use of DME, Decreased mobility, Decreased strength, Increased edema, Postural dysfunction, Prosthetic Dependency  Visit Diagnosis: Other abnormalities of gait and mobility  Unsteadiness on feet  Abnormal posture  Weakness generalized  Stiffness of right hip, not elsewhere classified  Stiffness of left hip, not elsewhere classified     Problem List Patient  Active Problem List   Diagnosis Date Noted  . S/P AKA (above knee amputation) unilateral, right ([place]) 11/13/2018  . Labile blood pressure   . E. coli UTI   . Phantom limb pain ([place])   . 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 Unilateral AKA ([place]) 12/26/2015  . Abnormality of gait   . Weakness   . Post-operative pain   . Benign essential HTN   . Anemia of chronic disease   . Type 2 diabetes mellitus with complication, without long-term current use of insulin ([place])   . Thrombocytosis ([place])   . Atherosclerosis of native arteries of extremities with gangrene, right leg ([place]) 12/23/2015  . Bleeding gastrointestinal   . Controlled diabetes mellitus type 2 with complications ([place])   . 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 PVD (peripheral vascular disease) ([place])   . Acute blood loss anemia   . Gangrene of foot ([place])   . Gastrointestinal hemorrhage with melena   . Lactic acidosis   . Acute renal failure ([place])   . Acute GI bleeding 11/22/2015  . Upper GI bleed 11/22/2015    Jamey Reas PT, DPT 12/23/2019, 11:31 PM  [facility] [address] [place], [place], [address] Phone: [phone]   Fax:  [phone]  Name: Arrihanna Alberici MRN: [id number] Date of Birth: 02/18/1941

## 2019-12-23 NOTE — Telephone Encounter (Signed)
I had the opportunity to evaluate Jessica Abbott today. Her prosthesis was delivered on 03/07/2016, socket revision on 04/17/2018 & last seen by prosthetist 09/02/2018. Her prosthesis weighs 7.8#. She would benefit from a new prosthesis. Can you please FAX [phone]) a prescription to [facility] for a right AKA prosthesis? Staci Righter, [facility] will send you the detailed prescription after he evaluates her but needs basic prescription to start the process.  Thank you Jamey Reas, PT, DPT PT Specializing in [place] Phone:  [phone]  Fax:  [phone] [facility] [address] [place], [place] [address]

## 2019-12-28 NOTE — Telephone Encounter (Signed)
Will do. Thanks.

## 2020-01-04 ENCOUNTER — Telehealth: Payer: Self-pay | Admitting: *Deleted

## 2020-01-04 NOTE — Telephone Encounter (Signed)
Hand written Rx faxed to [facility] for right AKA.

## 2020-01-25 ENCOUNTER — Encounter: Payer: Medicare Other | Attending: Physical Medicine & Rehabilitation | Admitting: Physical Medicine & Rehabilitation

## 2020-01-25 DIAGNOSIS — Z89611 Acquired absence of right leg above knee: Secondary | ICD-10-CM | POA: Insufficient documentation

## 2020-01-25 DIAGNOSIS — I10 Essential (primary) hypertension: Secondary | ICD-10-CM | POA: Insufficient documentation

## 2020-01-25 DIAGNOSIS — R269 Unspecified abnormalities of gait and mobility: Secondary | ICD-10-CM | POA: Insufficient documentation

## 2020-05-11 ENCOUNTER — Telehealth: Payer: Self-pay | Admitting: *Deleted

## 2020-05-11 NOTE — Telephone Encounter (Addendum)
Jessica Abbott, [name] daughter called about her problem with getting the new prosthetic from [facility]..  She says they have been waiting for months and [facility] says they do not have the order. I have found the order under media that was faxed to them back in April of this year. Ken refaxed to [facility] [employer].  Her daughter called back and is now saying they need a recent note with information stated as to why the prosthetic is necessary.

## 2020-05-12 NOTE — Telephone Encounter (Signed)
Completed, however, if she needs an office note, we would need to schedule her to be seen again. Thanks.

## 2020-05-12 NOTE — Telephone Encounter (Signed)
Faxed new Rx to [facility]. We do not have an updated note. I have faxed the only note we have which is from the 11/16/19 visit.

## 2020-08-29 ENCOUNTER — Other Ambulatory Visit: Payer: Self-pay | Admitting: *Deleted

## 2020-08-29 DIAGNOSIS — I779 Disorder of arteries and arterioles, unspecified: Secondary | ICD-10-CM

## 2020-09-07 ENCOUNTER — Other Ambulatory Visit: Payer: Self-pay

## 2020-09-07 ENCOUNTER — Encounter: Payer: Self-pay | Admitting: Vascular Surgery

## 2020-09-07 ENCOUNTER — Ambulatory Visit (HOSPITAL_COMMUNITY)
Admission: RE | Admit: 2020-09-07 | Discharge: 2020-09-07 | Disposition: A | Discharge status: Home / Self Care | Payer: Medicare Other | Source: Ambulatory Visit | Attending: Vascular Surgery | Admitting: Vascular Surgery

## 2020-09-07 ENCOUNTER — Ambulatory Visit (INDEPENDENT_AMBULATORY_CARE_PROVIDER_SITE_OTHER)
Admission: RE | Admit: 2020-09-07 | Discharge: 2020-09-07 | Disposition: A | Discharge status: Home / Self Care | Payer: Medicare Other | Source: Ambulatory Visit | Attending: Vascular Surgery | Admitting: Vascular Surgery

## 2020-09-07 ENCOUNTER — Ambulatory Visit (INDEPENDENT_AMBULATORY_CARE_PROVIDER_SITE_OTHER): Payer: Medicare Other | Admitting: Vascular Surgery

## 2020-09-07 VITALS — BP 177/65 | HR 76 | Temp 98.2°F | Resp 20 | Ht 62.0 in | Wt 145.0 lb

## 2020-09-07 DIAGNOSIS — I779 Disorder of arteries and arterioles, unspecified: Secondary | ICD-10-CM

## 2020-09-07 NOTE — Progress Notes (Signed)
REASON FOR VISIT:   Follow-up of peripheral vascular disease.  MEDICAL ISSUES:   PERIPHERAL VASCULAR DISEASE: This patient has a left femoral to dorsalis pedis bypass graft.  She has some narrowing at the proximal anastomosis and the artery proximal to this however these velocities have been stable and her ABIs and toe pressures have been stable.  She also has a couple AV fistulas.  Given her age or trying to follow this conservatively and would only consider arteriography and possible intervention if she becomes symptomatic or if her ABIs dropped further.  I have ordered a follow-up graft duplex and ABIs in 9 months and I will see her back at that time.  She knows to call sooner if she has problems.  She is on aspirin and is on a statin.  She is not a smoker.    HPI:   Jessica Abbott is a pleasant 79 y.o. female who I last saw on 09/30/2019.  She has undergone a previous left femoral to dorsalis pedis bypass elsewhere approximately 3 years ago.  She has a small fistula in her mid thigh and a large fistula at the level of the knee.  She is had a previous right above-the-knee amputation.  She does ambulate with a walker using her prosthesis.  She also had a history of carotid bruits.  At her last visit we did obtain a carotid duplex scan which showed no evidence of carotid disease bilaterally.  I did not think she needed routine follow-up carotid studies unless she developed new symptoms.  She was on aspirin and was on a statin.  With respect to her bypass graft she has an easily palpable graft pulse at the ankle.  She had some areas of stenosis within the graft and some drop in her ABI but given her age I favored a conservative approach.  We also planned on following these AV fistulas I would only consider addressing these if she develops significant symptoms.  Since I saw her last she denies any claudication.  She is ambulatory with her prosthesis.  She denies any history of rest pain or  nonhealing ulcers.  She is not a smoker.  She denies any history of stroke, TIAs, expressive or receptive aphasia, or amaurosis fugax.  Past Medical History:  Diagnosis Date  . Anemia   . Aortic regurgitation    moderate AR by 11/25/15 echo  . Arthritis   . Chronic kidney disease   . Diabetes mellitus without complication ([place])   . History of blood transfusion   . History of bronchitis   . History of pneumonia   . Hypertension   . Peripheral arterial disease ([place])   . Peripheral vascular disease ([place])   . Pulmonary hypertension ([place])    8/33/82 echo: PA systolic pressure severely increaed, PA peak pressure 66 mm Hg.   . 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 SOB (shortness of breath)   . Stress incontinence     Family History  Problem Relation Age of Onset  . Heart disease Father     SOCIAL HISTORY: Social History   Tobacco Use  . Smoking status: Former Smoker    Quit date: 10/27/2013    Years since quitting: 6.8  . Smokeless tobacco: Never Used  Substance Use Topics  . Alcohol use: No    Alcohol/week: 0.0 standard drinks    Allergies  Allergen Reactions  . Gabapentin Other (See Comments)    Per patient, 300mg  dose made  her extremely weak and unable to move. Ok with lower doses.    Current Outpatient Medications  Medication Sig Dispense Refill  . albuterol (PROVENTIL HFA;VENTOLIN HFA) 108 (90 Base) MCG/ACT inhaler Inhale 2 puffs into the lungs every 6 (six) hours as needed for wheezing or shortness of breath.    Marland Kitchen amLODipine (NORVASC) 10 MG tablet Take 1 tablet by mouth daily.    Marland Kitchen aspirin EC 81 MG tablet Take 81 mg by mouth daily.     Marland Kitchen atorvastatin (LIPITOR) 10 MG tablet TAKE ONE TABLET BY MOUTH ONE TIME DAILY    . Cholecalciferol 1000 units tablet Take 1,000 Units by mouth daily.    Marland Kitchen docusate sodium (COLACE) 100 MG capsule Take 100 mg by mouth daily as needed (constipation).    . ferrous sulfate 325 (65 FE) MG tablet Take 1 tablet (325  mg total) by mouth 2 (two) times daily with a meal. 60 tablet 1  . hydrochlorothiazide (MICROZIDE) 12.5 MG capsule Take by mouth.    . lactose free nutrition (BOOST) LIQD Take 237 mLs by mouth daily as needed (occasionally).     Marland Kitchen lisinopril (ZESTRIL) 20 MG tablet Take 20 mg by mouth daily.    Marland Kitchen losartan (COZAAR) 100 MG tablet Take 100 mg by mouth daily.    . metFORMIN (GLUCOPHAGE) 500 MG tablet Take 500 mg by mouth daily with breakfast. Reported on 03/14/2016    . lisinopril (PRINIVIL,ZESTRIL) 10 MG tablet Take 10 mg by mouth daily. (Patient not taking: Reported on 09/07/2020)     No current facility-administered medications for this visit.    REVIEW OF SYSTEMS:  [X]  denotes positive finding, [ ]  denotes negative finding Cardiac  Comments:  Chest pain or chest pressure:    Shortness of breath upon exertion:    Short of breath when lying flat:    Irregular heart rhythm:        Vascular    Pain in calf, thigh, or hip brought on by ambulation:    Pain in feet at night that wakes you up from your sleep:     Blood clot in your veins:    Leg swelling:         Pulmonary    Oxygen at home:    Productive cough:     Wheezing:         Neurologic    Sudden weakness in arms or legs:     Sudden numbness in arms or legs:     Sudden onset of difficulty speaking or slurred speech:    Temporary loss of vision in one eye:     Problems with dizziness:         Gastrointestinal    Blood in stool:     Vomited blood:         Genitourinary    Burning when urinating:     Blood in urine:        Psychiatric    Major depression:         Hematologic    Bleeding problems:    Problems with blood clotting too easily:        Skin    Rashes or ulcers:        Constitutional    Fever or chills:     PHYSICAL EXAM:   Vitals:   09/07/20 1129  BP: (!) 177/65  Pulse: 76  Resp: 20  Temp: 98.2 F (36.8 C)  SpO2: 96%  Weight: 145 lb (65.8 kg)  Height:  5\' 2"  (1.575 m)    GENERAL: The  patient is a well-nourished female, in no acute distress. The vital signs are documented above. CARDIAC: There is a regular rate and rhythm.  VASCULAR: She has a left carotid bruit. She has palpable femoral pulses. She has a palpable graft pulse on the left. She has no significant left lower extremity swelling. PULMONARY: There is good air exchange bilaterally without wheezing or rales. ABDOMEN: Soft and non-tender with normal pitched bowel sounds.  MUSCULOSKELETAL: She has a below the knee amputation on the right. NEUROLOGIC: No focal weakness or paresthesias are detected. SKIN: There are no ulcers or rashes noted. PSYCHIATRIC: The patient has a normal affect.  DATA:    GRAFT DUPLEX: I have independently interpreted her graft duplex.  Her left femoral to dorsalis pedis artery bypass graft is patent with elevated velocities at the proximal anastomosis and in the inflow artery proximal to this.  She is also noted to have some AV fistulas in the proximal graft and mid graft.  ARTERIAL DOPPLER STUDY: I have independently interpreted her arterial Doppler study today.  On the left side there is a monophasic dorsalis pedis and posterior tibial signal.  ABIs 53% toe pressures 50 mmHg.  Patient has an AKA on the right.  [name] [facility] [phone]

## 2020-09-08 ENCOUNTER — Other Ambulatory Visit: Payer: Self-pay

## 2020-09-08 DIAGNOSIS — I779 Disorder of arteries and arterioles, unspecified: Secondary | ICD-10-CM

## 2021-06-07 ENCOUNTER — Encounter (HOSPITAL_COMMUNITY): Payer: Medicare Other

## 2021-06-07 ENCOUNTER — Ambulatory Visit: Payer: Medicare Other | Admitting: Vascular Surgery

## 2021-06-07 ENCOUNTER — Other Ambulatory Visit (HOSPITAL_COMMUNITY): Payer: Medicare Other

## 2021-06-08 ENCOUNTER — Ambulatory Visit (INDEPENDENT_AMBULATORY_CARE_PROVIDER_SITE_OTHER): Payer: Medicare Other | Admitting: Vascular Surgery

## 2021-06-08 ENCOUNTER — Ambulatory Visit (INDEPENDENT_AMBULATORY_CARE_PROVIDER_SITE_OTHER)
Admission: RE | Admit: 2021-06-08 | Discharge: 2021-06-08 | Disposition: A | Discharge status: Home / Self Care | Payer: Medicare Other | Source: Ambulatory Visit | Attending: Vascular Surgery | Admitting: Vascular Surgery

## 2021-06-08 ENCOUNTER — Other Ambulatory Visit: Payer: Self-pay

## 2021-06-08 ENCOUNTER — Encounter: Payer: Self-pay | Admitting: Vascular Surgery

## 2021-06-08 ENCOUNTER — Ambulatory Visit (HOSPITAL_COMMUNITY)
Admission: RE | Admit: 2021-06-08 | Discharge: 2021-06-08 | Disposition: A | Discharge status: Home / Self Care | Payer: Medicare Other | Source: Ambulatory Visit | Attending: Vascular Surgery | Admitting: Vascular Surgery

## 2021-06-08 VITALS — BP 148/69 | HR 73 | Temp 97.9°F | Resp 16 | Ht 62.0 in | Wt 145.0 lb

## 2021-06-08 DIAGNOSIS — I779 Disorder of arteries and arterioles, unspecified: Secondary | ICD-10-CM | POA: Diagnosis present

## 2021-06-08 NOTE — Progress Notes (Signed)
REASON FOR VISIT:   Follow-up of peripheral vascular disease  MEDICAL ISSUES:   PERIPHERAL VASCULAR DISEASE: She has developed some areas of stenosis proximal to her bypass graft and at the proximal and distal anastomosis.  However the graft has a good pulse and she is asymptomatic.  We discussed arteriography but have decided that it would be reasonable to follow this for now and only consider arteriography and possible intervention if the flow in the graft dropped significantly.  She understands there are some risk of graft thrombosis with this however given her age I think this is perfectly reasonable.  I will see her back in 9 months.  We will get a graft duplex and ABIs at that time.  She knows to call sooner if she has problems.  LEFT CAROTID BRUIT: Patient has a left carotid bruit.  She did have a carotid duplex scan in January of last year which showed no significant carotid disease on either side.  I think we should repeat her duplex when she comes back in 9 months.  She is on aspirin and is on a statin.  She is not a smoker.  She is asymptomatic.    HPI:   Jessica Abbott is a pleasant 80 y.o. female who underwent a left femoral to dorsalis pedis bypass in September 2016.  This graft was done elsewhere.  Of note she did have an arteriogram back in February 2018 which showed patent bypass graft with a couple of fistulas.  Thus the graft was done in situ.  Since I saw her last she is ambulatory with her prosthesis.  She has no significant claudication.  She denies rest pain or nonhealing ulcers.  She denies any history of stroke, TIAs, expressive or receptive aphasia, or amaurosis fugax.  Past Medical History:  Diagnosis Date   Anemia    Aortic regurgitation    moderate AR by 11/25/15 echo   Arthritis    Chronic kidney disease    Diabetes mellitus without complication ([place])    History of blood transfusion    History of bronchitis    History of pneumonia    Hypertension     Peripheral arterial disease ([place])    Peripheral vascular disease ([place])    Pulmonary hypertension ([place])    2/75/17 echo: PA systolic pressure severely increaed, PA peak pressure 66 mm Hg.    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 Ok with lower doses.    Current Outpatient Medications  Medication Sig Dispense Refill   albuterol (PROVENTIL HFA;VENTOLIN HFA) 108 (90 Base) MCG/ACT inhaler Inhale 2 puffs into the lungs every 6 (six) hours as needed for wheezing or shortness of breath.     amLODipine (NORVASC) 10 MG tablet Take 1 tablet by mouth daily.     aspirin EC 81 MG tablet Take 81 mg  by mouth daily.      atorvastatin (LIPITOR) 10 MG tablet TAKE ONE TABLET BY MOUTH ONE TIME DAILY     Cholecalciferol 1000 units tablet Take 1,000 Units by mouth daily.     docusate sodium (COLACE) 100 MG capsule Take 100 mg by mouth daily as needed (constipation).     ferrous sulfate 325 (65 FE) MG tablet Take 1 tablet (325 mg total) by mouth 2 (two) times daily with a meal. 60 tablet 1   hydrochlorothiazide (MICROZIDE) 12.5 MG capsule Take by mouth.     lactose free nutrition (BOOST) LIQD Take 237 mLs by mouth daily as needed (occasionally).      lisinopril (ZESTRIL) 20 MG tablet Take 20 mg by mouth daily.     losartan (COZAAR) 100 MG tablet Take 100 mg by mouth daily.     metFORMIN (GLUCOPHAGE) 500 MG tablet Take 500 mg by mouth daily with breakfast. Reported on  03/14/2016     lisinopril (PRINIVIL,ZESTRIL) 10 MG tablet Take 10 mg by mouth daily. (Patient not taking: No sig reported)     No current facility-administered medications for this visit.    REVIEW OF SYSTEMS:  [X]  denotes positive finding, [ ]  denotes negative finding Cardiac  Comments:  Chest pain or chest pressure:    Shortness of breath upon exertion:    Short of breath when lying flat:    Irregular heart rhythm:        Vascular    Pain in calf, thigh, or hip brought on by ambulation:    Pain in feet at night that wakes you up from your sleep:     Blood clot in your veins:    Leg swelling:         Pulmonary    Oxygen at home:    Productive cough:     Wheezing:         Neurologic    Sudden weakness in arms or legs:     Sudden numbness in arms or legs:     Sudden onset of difficulty speaking or slurred speech:    Temporary loss of vision in one eye:     Problems with dizziness:         Gastrointestinal    Blood in stool:     Vomited blood:         Genitourinary    Burning when urinating:     Blood in urine:        Psychiatric    Major depression:         Hematologic    Bleeding problems:    Problems with blood clotting too easily:        Skin    Rashes or ulcers:        Constitutional    Fever or chills:     PHYSICAL EXAM:   Vitals:   06/08/21 1234  BP: (!) 148/69  Pulse: 73  Resp: 16  Temp: 97.9 F (36.6 C)  SpO2: 96%  Weight: 145 lb (65.8 kg)  Height: 5\' 2"  (1.575 m)    GENERAL: The patient is a well-nourished female, in no acute distress. The vital signs are documented above. CARDIAC: There is a regular rate and rhythm.  VASCULAR: She has a left carotid bruit. She has palpable femoral pulses and a palpable graft pulse on the left. The left foot is warm and well-perfused. PULMONARY: There is good air exchange bilaterally without wheezing or rales. MUSCULOSKELETAL: She has a right AKA. NEUROLOGIC:  No focal weakness or paresthesias are  detected. SKIN: There are no ulcers or rashes noted. PSYCHIATRIC: The patient has a normal affect.  DATA:    ARTERIAL DUPLEX: I have independently interpreted the patient's arterial duplex scan today.  The left femoral to dorsalis pedis bypass is patent with monophasic flow throughout.  There are some elevated velocities at the proximal anastomosis and also some in the distal graft and distal anastomosis.  ARTERIAL DOPPLER STUDY: I have independently interpreted her arterial Doppler study today.  This was of the left lower extremity only as the patient has a right above-the-knee amputation.  On the left side there is a monophasic dorsalis pedis and posterior tibial signal with an ABI of 42%.  Toe pressure 67 mmHg.  CAROTID DUPLEX: The patient did have a carotid duplex scan in January of 2021 which showed less than 39% carotid stenosis bilaterally.  [name] [facility] [phone]

## 2021-06-13 ENCOUNTER — Other Ambulatory Visit: Payer: Self-pay

## 2021-06-13 DIAGNOSIS — I6529 Occlusion and stenosis of unspecified carotid artery: Secondary | ICD-10-CM

## 2021-06-13 DIAGNOSIS — I779 Disorder of arteries and arterioles, unspecified: Secondary | ICD-10-CM

## 2022-07-03 ENCOUNTER — Other Ambulatory Visit: Payer: Self-pay | Admitting: *Deleted

## 2022-07-03 DIAGNOSIS — I6529 Occlusion and stenosis of unspecified carotid artery: Secondary | ICD-10-CM

## 2022-07-03 DIAGNOSIS — I779 Disorder of arteries and arterioles, unspecified: Secondary | ICD-10-CM

## 2022-07-12 ENCOUNTER — Encounter: Payer: Self-pay | Admitting: Vascular Surgery

## 2022-07-12 ENCOUNTER — Ambulatory Visit (INDEPENDENT_AMBULATORY_CARE_PROVIDER_SITE_OTHER)
Admission: RE | Admit: 2022-07-12 | Discharge: 2022-07-12 | Disposition: A | Discharge status: Home / Self Care | Payer: Medicare Other | Source: Ambulatory Visit | Attending: Vascular Surgery | Admitting: Vascular Surgery

## 2022-07-12 ENCOUNTER — Ambulatory Visit (HOSPITAL_COMMUNITY)
Admission: RE | Admit: 2022-07-12 | Discharge: 2022-07-12 | Disposition: A | Discharge status: Home / Self Care | Payer: Medicare Other | Source: Ambulatory Visit | Attending: Vascular Surgery | Admitting: Vascular Surgery

## 2022-07-12 ENCOUNTER — Ambulatory Visit: Payer: Medicare Other | Admitting: Vascular Surgery

## 2022-07-12 VITALS — BP 159/56 | HR 86 | Temp 97.9°F | Resp 20 | Ht 62.0 in | Wt 145.0 lb

## 2022-07-12 DIAGNOSIS — I779 Disorder of arteries and arterioles, unspecified: Secondary | ICD-10-CM | POA: Diagnosis present

## 2022-07-12 DIAGNOSIS — I6529 Occlusion and stenosis of unspecified carotid artery: Secondary | ICD-10-CM | POA: Insufficient documentation

## 2022-07-12 DIAGNOSIS — R0989 Other specified symptoms and signs involving the circulatory and respiratory systems: Secondary | ICD-10-CM | POA: Diagnosis not present

## 2022-07-12 NOTE — Progress Notes (Signed)
REASON FOR VISIT:     MEDICAL ISSUES:   PERIPHERAL ARTERIAL DISEASE: This patient has a patent left femoral to dorsalis pedis bypass graft that was done in 2016 in [place].  The graft is patent.  She has an elevated velocities at the proximal anastomosis but the velocities of actually improved.  She has an easily palpable graft in her foot and is asymptomatic.  She is ambulatory with her right AKA prosthesis.  I have ordered follow-up ABIs and a graft duplex in 1 year.  She understands that I will be retiring so she will be seen on the PA schedule.  BILATERAL CAROTID BRUITS: She does have bilateral carotid bruits including a fairly loud carotid bruit on the left.  However her carotid duplex scan today shows no evidence of significant carotid disease.  She does have a fairly tight external carotid artery stenosis on the left which likely explains this bruit.  She is asymptomatic.  I do not think she needs yearly carotid duplex scans but it may be worth checking again in 2 years.  She is on aspirin and is on a statin.   HPI:   Jessica Abbott is a pleasant 81 y.o. female who I been following with peripheral arterial disease and carotid disease.  This patient underwent a left femoral to dorsalis pedis bypass in 2016.  This was done in [place].  She had a right above-the-knee amputation in 2017.  She is ambulatory with her prosthesis.  She denies any claudication in the left leg.  She denies any rest pain.  She denies any history of stroke, TIAs, expressive or receptive aphasia, or amaurosis fugax.  She is on aspirin and is on a statin.  Past Medical History:  Diagnosis Date   Anemia    Aortic regurgitation    moderate AR by 11/25/15 echo   Arthritis    Chronic kidney disease    Diabetes mellitus without complication ([place])    History of blood transfusion    History of bronchitis    History of pneumonia    Hypertension    Peripheral arterial disease ([place])    Peripheral  vascular disease ([place])    Pulmonary hypertension ([place])    7/79/39 echo: PA systolic pressure severely increaed, PA peak pressure 66 mm Hg.    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 Ok with lower doses.    Current Outpatient Medications  Medication Sig Dispense Refill   albuterol (PROVENTIL HFA;VENTOLIN HFA) 108 (90 Base) MCG/ACT inhaler Inhale 2 puffs into the lungs every 6 (six) hours as needed for wheezing or shortness of breath.     amLODipine (NORVASC) 10 MG tablet Take 1 tablet by mouth daily.     aspirin EC 81 MG tablet Take 81 mg by mouth daily.      atorvastatin (LIPITOR)  10 MG tablet TAKE ONE TABLET BY MOUTH ONE TIME DAILY     Cholecalciferol 1000 units tablet Take 1,000 Units by mouth daily.     docusate sodium (COLACE) 100 MG capsule Take 100 mg by mouth daily as needed (constipation).     ferrous sulfate 325 (65 FE) MG tablet Take 1 tablet (325 mg total) by mouth 2 (two) times daily with a meal. 60 tablet 1   hydrochlorothiazide (MICROZIDE) 12.5 MG capsule Take by mouth.     lactose free nutrition (BOOST) LIQD Take 237 mLs by mouth daily as needed (occasionally).      lisinopril (PRINIVIL,ZESTRIL) 10 MG tablet Take 10 mg by mouth daily.     lisinopril (ZESTRIL) 20 MG tablet Take 20 mg by mouth daily.     losartan (COZAAR) 100 MG tablet Take 100 mg by mouth daily.     metFORMIN (GLUCOPHAGE) 500 MG tablet Take 500 mg by mouth  daily with breakfast. Reported on 03/14/2016     No current facility-administered medications for this visit.    REVIEW OF SYSTEMS:  '[X]'$  denotes positive finding, '[ ]'$  denotes negative finding Cardiac  Comments:  Chest pain or chest pressure:    Shortness of breath upon exertion:    Short of breath when lying flat:    Irregular heart rhythm:        Vascular    Pain in calf, thigh, or hip brought on by ambulation:    Pain in feet at night that wakes you up from your sleep:     Blood clot in your veins:    Leg swelling:         Pulmonary    Oxygen at home:    Productive cough:     Wheezing:         Neurologic    Sudden weakness in arms or legs:     Sudden numbness in arms or legs:     Sudden onset of difficulty speaking or slurred speech:    Temporary loss of vision in one eye:     Problems with dizziness:         Gastrointestinal    Blood in stool:     Vomited blood:         Genitourinary    Burning when urinating:     Blood in urine:        Psychiatric    Major depression:         Hematologic    Bleeding problems:    Problems with blood clotting too easily:        Skin    Rashes or ulcers:        Constitutional    Fever or chills:     PHYSICAL EXAM:   Vitals:   07/12/22 1123  BP: (!) 159/56  Pulse: 86  Resp: 20  Temp: 97.9 F (36.6 C)  SpO2: 97%  Weight: 145 lb (65.8 kg)  Height: '5\' 2"'$  (1.575 m)    GENERAL: The patient is a well-nourished female, in no acute distress. The vital signs are documented above. CARDIAC: There is a regular rate and rhythm.  VASCULAR: She does have bilateral carotid bruits.  The carotid bruit on the left is fairly loud. She has palpable femoral pulses.  She has an easily palpable graft pulse down to the foot on the left. She has no significant left lower extremity swelling. PULMONARY: There is good air exchange bilaterally without wheezing or rales. ABDOMEN: Soft and non-tender with  normal pitched bowel sounds.   MUSCULOSKELETAL: There are no major deformities or cyanosis. NEUROLOGIC: No focal weakness or paresthesias are detected. SKIN: There are no ulcers or rashes noted. PSYCHIATRIC: The patient has a normal affect.  DATA:    ARTERIAL DOPPLER STUDY: I have independently interpreted the patient's arterial Doppler studies.  This was of the left lower extremity only.  The patient has a monophasic posterior tibial signal with a biphasic dorsalis pedis signal.  ABIs 66%.  Toe pressure 79 mmHg.  GRAFT DUPLEX: I have independently interpreted her graft duplex scan today.  The left femoral to dorsalis pedis artery bypass graft is patent.  There is some elevated velocities at the proximal anastomosis.  There is monophasic flow throughout the graft.  No significant plaque is visualized.  CAROTID DUPLEX: I have independently interpreted her carotid duplex scan today.  On the right side there is a less than 39% stenosis.  The right vertebral artery is patent antegrade flow.  On the left side there is a less than 39% stenosis.  The left vertebral artery is patent with antegrade flow.   [name] [facility] [phone]

## 2023-01-17 ENCOUNTER — Telehealth: Payer: Self-pay

## 2023-01-17 NOTE — Telephone Encounter (Signed)
Pt's daughter, Jessica Abbott, called stating the pt is having leg swelling, but she's been taken off her "fluid pills" and thinks that's the reason.  Reviewed pt's chart, returned call for clarification, two identifiers used. Got verbal ok from pt to speak to [place] since she does not have an updated DPR. Pt denies any other symptoms. Pt states the swelling started about a month ago at the same time she was taken off the medication.  Called [place], no answer, lf vm instructing her to call pt's PCP since the swelling seemed directly related to the discontinuation of the medication.  Jessica Abbott returned call, she had not listened to her vm. Instructed to call PCP, proper elevation, and use of compression. Confirmed understanding.

## 2023-05-01 ENCOUNTER — Other Ambulatory Visit: Payer: Self-pay

## 2023-05-01 ENCOUNTER — Emergency Department (HOSPITAL_BASED_OUTPATIENT_CLINIC_OR_DEPARTMENT_OTHER)
Admission: EM | Admit: 2023-05-01 | Discharge: 2023-05-01 | Disposition: A | Discharge status: Home / Self Care | Payer: Medicare Other | Attending: Emergency Medicine | Admitting: Emergency Medicine

## 2023-05-01 ENCOUNTER — Encounter (HOSPITAL_BASED_OUTPATIENT_CLINIC_OR_DEPARTMENT_OTHER): Payer: Self-pay | Admitting: Emergency Medicine

## 2023-05-01 ENCOUNTER — Emergency Department (HOSPITAL_BASED_OUTPATIENT_CLINIC_OR_DEPARTMENT_OTHER): Payer: Medicare Other

## 2023-05-01 DIAGNOSIS — N189 Chronic kidney disease, unspecified: Secondary | ICD-10-CM | POA: Insufficient documentation

## 2023-05-01 DIAGNOSIS — N390 Urinary tract infection, site not specified: Secondary | ICD-10-CM | POA: Diagnosis not present

## 2023-05-01 DIAGNOSIS — Z7984 Long term (current) use of oral hypoglycemic drugs: Secondary | ICD-10-CM | POA: Insufficient documentation

## 2023-05-01 DIAGNOSIS — R911 Solitary pulmonary nodule: Secondary | ICD-10-CM | POA: Insufficient documentation

## 2023-05-01 DIAGNOSIS — I129 Hypertensive chronic kidney disease with stage 1 through stage 4 chronic kidney disease, or unspecified chronic kidney disease: Secondary | ICD-10-CM | POA: Diagnosis not present

## 2023-05-01 DIAGNOSIS — N179 Acute kidney failure, unspecified: Secondary | ICD-10-CM | POA: Diagnosis not present

## 2023-05-01 DIAGNOSIS — R7401 Elevation of levels of liver transaminase levels: Secondary | ICD-10-CM | POA: Diagnosis not present

## 2023-05-01 DIAGNOSIS — E1122 Type 2 diabetes mellitus with diabetic chronic kidney disease: Secondary | ICD-10-CM | POA: Diagnosis not present

## 2023-05-01 DIAGNOSIS — Z1152 Encounter for screening for COVID-19: Secondary | ICD-10-CM | POA: Diagnosis not present

## 2023-05-01 DIAGNOSIS — R531 Weakness: Secondary | ICD-10-CM | POA: Insufficient documentation

## 2023-05-01 DIAGNOSIS — R112 Nausea with vomiting, unspecified: Secondary | ICD-10-CM | POA: Insufficient documentation

## 2023-05-01 DIAGNOSIS — Z7982 Long term (current) use of aspirin: Secondary | ICD-10-CM | POA: Insufficient documentation

## 2023-05-01 DIAGNOSIS — Z79899 Other long term (current) drug therapy: Secondary | ICD-10-CM | POA: Diagnosis not present

## 2023-05-01 LAB — COMPREHENSIVE METABOLIC PANEL
ALT: 10 U/L (ref 0–44)
AST: 17 U/L (ref 15–41)
Albumin: 3.3 g/dL — ABNORMAL LOW (ref 3.5–5.0)
Alkaline Phosphatase: 51 U/L (ref 38–126)
Anion gap: 11 (ref 5–15)
BUN: 43 mg/dL — ABNORMAL HIGH (ref 8–23)
CO2: 19 mmol/L — ABNORMAL LOW (ref 22–32)
Calcium: 8.8 mg/dL — ABNORMAL LOW (ref 8.9–10.3)
Chloride: 102 mmol/L (ref 98–111)
Creatinine, Ser: 1.98 mg/dL — ABNORMAL HIGH (ref 0.44–1.00)
GFR, Estimated: 25 mL/min — ABNORMAL LOW (ref >60)
Glucose, Bld: 151 mg/dL — ABNORMAL HIGH (ref 70–99)
Potassium: 4.5 mmol/L (ref 3.5–5.1)
Sodium: 132 mmol/L — ABNORMAL LOW (ref 135–145)
Total Bilirubin: 0.6 mg/dL (ref 0.3–1.2)
Total Protein: 7.7 g/dL (ref 6.5–8.1)

## 2023-05-01 LAB — URINALYSIS, ROUTINE W REFLEX MICROSCOPIC
Bilirubin Urine: NEGATIVE
Glucose, UA: NEGATIVE mg/dL
Ketones, ur: NEGATIVE mg/dL
Nitrite: NEGATIVE
Protein, ur: >=300 mg/dL — AB
Specific Gravity, Urine: 1.02 (ref 1.005–1.030)
pH: 7.5 (ref 5.0–8.0)

## 2023-05-01 LAB — CBC
HCT: 26.8 % — ABNORMAL LOW (ref 36.0–46.0)
Hemoglobin: 8.5 g/dL — ABNORMAL LOW (ref 12.0–15.0)
MCH: 22 pg — ABNORMAL LOW (ref 26.0–34.0)
MCHC: 31.7 g/dL (ref 30.0–36.0)
MCV: 69.4 fL — ABNORMAL LOW (ref 80.0–100.0)
Platelets: 420 10*3/uL — ABNORMAL HIGH (ref 150–400)
RBC: 3.86 MIL/uL — ABNORMAL LOW (ref 3.87–5.11)
RDW: 17.1 % — ABNORMAL HIGH (ref 11.5–15.5)
WBC: 10.7 10*3/uL — ABNORMAL HIGH (ref 4.0–10.5)
nRBC: 0 % (ref 0.0–0.2)

## 2023-05-01 LAB — URINALYSIS, MICROSCOPIC (REFLEX): WBC, UA: >50 WBC/hpf (ref 0–5)

## 2023-05-01 LAB — LACTIC ACID, PLASMA: Lactic Acid, Venous: 0.8 mmol/L (ref 0.5–1.9)

## 2023-05-01 LAB — SARS CORONAVIRUS 2 BY RT PCR: SARS Coronavirus 2 by RT PCR: NEGATIVE

## 2023-05-01 LAB — LIPASE, BLOOD: Lipase: 69 U/L — ABNORMAL HIGH (ref 11–51)

## 2023-05-01 LAB — TROPONIN I (HIGH SENSITIVITY): Troponin I (High Sensitivity): 10 ng/L (ref <18)

## 2023-05-01 MED ORDER — CEPHALEXIN 250 MG PO CAPS: 500.0000 mg | ORAL_CAPSULE | Freq: Three times a day (TID) | ORAL | 0 refills | Status: AC

## 2023-05-01 MED ORDER — ACETAMINOPHEN 325 MG PO TABS
650.0000 mg | ORAL_TABLET | Freq: Once | ORAL | Status: AC | PRN
  Administered 2023-05-01: 650 mg via ORAL
  Filled 2023-05-01: qty 2

## 2023-05-01 MED ORDER — SODIUM CHLORIDE 0.9 % IV SOLN
1.0000 g | Freq: Once | INTRAVENOUS | Status: AC
  Administered 2023-05-01: 1 g via INTRAVENOUS
  Filled 2023-05-01: qty 10

## 2023-05-01 MED ORDER — LACTATED RINGERS IV BOLUS
1000.0000 mL | Freq: Once | INTRAVENOUS | Status: AC
  Administered 2023-05-01: 1000 mL via INTRAVENOUS

## 2023-05-01 MED ORDER — ONDANSETRON 4 MG PO TBDP: 4.0000 mg | ORAL_TABLET | Freq: Three times a day (TID) | ORAL | 0 refills | Status: AC | PRN

## 2023-05-01 MED ORDER — ONDANSETRON HCL 4 MG/2ML IJ SOLN
4.0000 mg | Freq: Once | INTRAMUSCULAR | Status: AC
  Administered 2023-05-01: 4 mg via INTRAVENOUS
  Filled 2023-05-01: qty 2

## 2023-05-01 NOTE — ED Notes (Signed)
Patient transported to X-ray 

## 2023-05-01 NOTE — ED Triage Notes (Signed)
Pt endorses n/v last night and able to eat a little today. Weakness earlier today but feels slighter better. Denies SOB.

## 2023-05-01 NOTE — ED Provider Notes (Signed)
EMERGENCY DEPARTMENT AT [facility] Provider Note   CSN: [id number] Arrival date & time: 05/01/23  1536     History {Add pertinent medical, surgical, social history, OB history to HPI:1} Chief Complaint  Patient presents with   Emesis    Quenetta Wiman is a 82 y.o. female.  HPI     82 year old female with a history of CKD, diabetes, hypertension, pulmonary hypertension, peripheral artery disease, AKA, who presents with concern for nausea and vomiting.  Last night had diarrhea, woke up this morning and felt weak, had nausea and vomiting this morning but not since then No abdominal pain or back pain, did not notice fever at home.  Felt kind of sore in shoulder on right side. Felt some dyspnea this morning.  No chest pain  Low appetite No pain with urination, no increased frequency  Coughed a few times off and on today, no congestion or sore throat    Past Medical History:  Diagnosis Date   Anemia    Aortic regurgitation    moderate AR by 11/25/15 echo   Arthritis    Chronic kidney disease    Diabetes mellitus without complication (HCC)    History of blood transfusion    History of bronchitis    History of pneumonia    Hypertension    Peripheral arterial disease (HCC)    Peripheral vascular disease (HCC)    Pulmonary hypertension (HCC)    11/25/15 echo: PA systolic pressure severely increaed, PA peak pressure 66 mm Hg.    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   SOB (shortness of breath)    Stress incontinence      Home Medications Prior to Admission medications   Medication Sig Start Date End Date Taking? Authorizing Provider  albuterol (PROVENTIL HFA;VENTOLIN HFA) 108 (90 Base) MCG/ACT inhaler Inhale 2 puffs into the lungs every 6 (six) hours as needed for wheezing or shortness of breath.    [provider]  amLODipine (NORVASC) 10 MG tablet Take 1 tablet by mouth daily. 06/06/20   [provider]  aspirin  EC 81 MG tablet Take 81 mg by mouth daily.     [provider]  atorvastatin (LIPITOR) 10 MG tablet TAKE ONE TABLET BY MOUTH ONE TIME DAILY 05/28/19   [provider]  Cholecalciferol 1000 units tablet Take 1,000 Units by mouth daily.    [provider]  docusate sodium (COLACE) 100 MG capsule Take 100 mg by mouth daily as needed (constipation).    [provider]  ferrous sulfate 325 (65 FE) MG tablet Take 1 tablet (325 mg total) by mouth 2 (two) times daily with a meal. 01/05/16   Love, Evlyn Kanner, PA-C  hydrochlorothiazide (MICROZIDE) 12.5 MG capsule Take by mouth. 09/29/19   [provider]  lactose free nutrition (BOOST) LIQD Take 237 mLs by mouth daily as needed (occasionally).     [provider]  lisinopril (PRINIVIL,ZESTRIL) 10 MG tablet Take 10 mg by mouth daily.    [provider]  lisinopril (ZESTRIL) 20 MG tablet Take 20 mg by mouth daily. 06/16/20   [provider]  losartan (COZAAR) 100 MG tablet Take 100 mg by mouth daily. 08/29/20   [provider]  metFORMIN (GLUCOPHAGE) 500 MG tablet Take 500 mg by mouth daily with breakfast. Reported on 03/14/2016    [provider]      Allergies    Gabapentin    Review of Systems   Review  of Systems  Physical Exam Updated Vital Signs BP (!) 150/90 (BP Location: Left Arm)   Pulse 65   Temp (!) 100.8 F (38.2 C)   Resp 20   Ht 5\' 2"  (1.575 m)   Wt 59 kg   SpO2 97%   BMI 23.78 kg/m  Physical Exam  ED Results / Procedures / Treatments   Labs (all labs ordered are listed, but only abnormal results are displayed) Labs Reviewed  LIPASE, BLOOD - Abnormal; Notable for the following components:      Result Value   Lipase 69 (*)    All other components within normal limits  COMPREHENSIVE METABOLIC PANEL - Abnormal; Notable for the following components:   Sodium 132 (*)    CO2 19 (*)    Glucose, Bld 151 (*)    BUN 43 (*)    Creatinine, Ser 1.98  (*)    Calcium 8.8 (*)    Albumin 3.3 (*)    GFR, Estimated 25 (*)    All other components within normal limits  CBC - Abnormal; Notable for the following components:   WBC 10.7 (*)    RBC 3.86 (*)    Hemoglobin 8.5 (*)    HCT 26.8 (*)    MCV 69.4 (*)    MCH 22.0 (*)    RDW 17.1 (*)    Platelets 420 (*)    All other components within normal limits  URINALYSIS, ROUTINE W REFLEX MICROSCOPIC - Abnormal; Notable for the following components:   APPearance CLOUDY (*)    Hgb urine dipstick MODERATE (*)    Protein, ur >=300 (*)    Leukocytes,Ua LARGE (*)    All other components within normal limits  URINALYSIS, MICROSCOPIC (REFLEX) - Abnormal; Notable for the following components:   Bacteria, UA MANY (*)    All other components within normal limits  SARS CORONAVIRUS 2 BY RT PCR    EKG EKG Interpretation Date/Time:  Wednesday May 01 2023 15:47:27 EDT Ventricular Rate:  61 PR Interval:  182 QRS Duration:  140 QT Interval:  442 QTC Calculation: 446 R Axis:   65  Text Interpretation: Sinus rhythm Nonspecific intraventricular conduction delay Anterior infarct, old New intraventricular conduction delay in comparison to prior ECG in 2017 Confirmed by Alvira Monday ([id number]) on 05/01/2023 3:59:28 PM  Radiology No results found.  Procedures Procedures  {Document cardiac monitor, telemetry assessment procedure when appropriate:1}  Medications Ordered in ED Medications  acetaminophen (TYLENOL) tablet 650 mg (650 mg Oral Given 05/01/23 1548)    ED Course/ Medical Decision Making/ A&P   {   Click here for ABCD2, HEART and other calculatorsREFRESH Note before signing :1}                              Medical Decision Making Amount and/or Complexity of Data Reviewed Labs: ordered.  Risk OTC drugs. Prescription drug management.   ***    For the fever on arrival to the emergency department of 100.8.   Labs completed and personally read interpreted by me show a negative  COVID test, lipase mildly elevated but not consistent with pancreatitis.  CMP is significant for AKI with a creatinine of 1.98 from prior of 0.7 although most recent lab was from 2018.  White blood cell count 10,700, hemoglobin of 8.5.  Urinalysis is consistent with urinary tract infection with greater than 50 white blood cells and many bacteria.  {Document critical care time when  appropriate:1} {Document review of labs and clinical decision tools ie heart score, Chads2Vasc2 etc:1}  {Document your independent review of radiology images, and any outside records:1} {Document your discussion with family members, caretakers, and with consultants:1} {Document social determinants of health affecting pt's care:1} {Document your decision making why or why not admission, treatments were needed:1} Final Clinical Impression(s) / ED Diagnoses Final diagnoses:  None    Rx / DC Orders ED Discharge Orders     None

## 2023-05-04 LAB — BLOOD CULTURE ID PANEL (REFLEXED) - BCID2

## 2023-05-05 LAB — CULTURE, BLOOD (ROUTINE X 2): Special Requests: ADEQUATE

## 2023-05-06 ENCOUNTER — Telehealth (HOSPITAL_BASED_OUTPATIENT_CLINIC_OR_DEPARTMENT_OTHER): Payer: Self-pay | Admitting: *Deleted

## 2023-05-06 LAB — CULTURE, BLOOD (ROUTINE X 2)
Culture: NO GROWTH
Special Requests: ADEQUATE

## 2023-05-06 NOTE — Telephone Encounter (Signed)
Post ED Visit - Positive Culture Follow-up  Culture report reviewed by antimicrobial stewardship pharmacist: [facility] []  Enzo Bi, Pharm.D. []  Celedonio Miyamoto, Pharm.D., BCPS AQ-ID []  Garvin Fila, Pharm.D., BCPS []  Georgina Pillion, Pharm.D., BCPS []  Pony, [place].D., BCPS, AAHIVP []  Estella Husk, Pharm.D., BCPS, AAHIVP []  Lysle Pearl, PharmD, BCPS []  Phillips Climes, PharmD, BCPS []  Agapito Games, PharmD, BCPS []  Verlan Friends, PharmD []  Mervyn Gay, PharmD, BCPS []  Vinnie Level, PharmD  [facility] []  Len Childs, PharmD []  Greer Pickerel, PharmD []  Adalberto Cole, PharmD []  Perlie Gold, Rph []  Lonell Face) Jean Rosenthal, PharmD []  Earl Many, PharmD []  Junita Push, PharmD []  Dorna Leitz, PharmD []  Terrilee Files, PharmD []  Lynann Beaver, PharmD []  Keturah Barre, PharmD []  Loralee Pacas, PharmD []  Bernadene Person, PharmD   Positive blood culture Treated with Cephalexin, organism sensitive to the same and no further patient follow-up is required at this time. Lora Paula, PharmD  Virl Axe Talley 05/06/2023, 9:52 AM

## 2023-05-12 ENCOUNTER — Other Ambulatory Visit: Payer: Self-pay

## 2023-05-12 ENCOUNTER — Encounter (HOSPITAL_BASED_OUTPATIENT_CLINIC_OR_DEPARTMENT_OTHER): Payer: Self-pay

## 2023-05-12 ENCOUNTER — Emergency Department (HOSPITAL_BASED_OUTPATIENT_CLINIC_OR_DEPARTMENT_OTHER): Payer: Medicare Other

## 2023-05-12 ENCOUNTER — Inpatient Hospital Stay (HOSPITAL_BASED_OUTPATIENT_CLINIC_OR_DEPARTMENT_OTHER)
Admission: EM | Admit: 2023-05-12 | Discharge: 2023-05-17 | DRG: 378 | Disposition: A | Discharge status: Home Health | Payer: Medicare Other | Attending: Internal Medicine | Admitting: Internal Medicine

## 2023-05-12 DIAGNOSIS — K2970 Gastritis, unspecified, without bleeding: Secondary | ICD-10-CM | POA: Diagnosis not present

## 2023-05-12 DIAGNOSIS — N3001 Acute cystitis with hematuria: Secondary | ICD-10-CM | POA: Diagnosis present

## 2023-05-12 DIAGNOSIS — Z87891 Personal history of nicotine dependence: Secondary | ICD-10-CM | POA: Diagnosis not present

## 2023-05-12 DIAGNOSIS — K5730 Diverticulosis of large intestine without perforation or abscess without bleeding: Secondary | ICD-10-CM | POA: Diagnosis not present

## 2023-05-12 DIAGNOSIS — Z7984 Long term (current) use of oral hypoglycemic drugs: Secondary | ICD-10-CM

## 2023-05-12 DIAGNOSIS — N390 Urinary tract infection, site not specified: Secondary | ICD-10-CM | POA: Diagnosis not present

## 2023-05-12 DIAGNOSIS — B965 Pseudomonas (aeruginosa) (mallei) (pseudomallei) as the cause of diseases classified elsewhere: Secondary | ICD-10-CM | POA: Diagnosis present

## 2023-05-12 DIAGNOSIS — Z993 Dependence on wheelchair: Secondary | ICD-10-CM

## 2023-05-12 DIAGNOSIS — Z7982 Long term (current) use of aspirin: Secondary | ICD-10-CM | POA: Diagnosis not present

## 2023-05-12 DIAGNOSIS — L932 Other local lupus erythematosus: Secondary | ICD-10-CM

## 2023-05-12 DIAGNOSIS — E669 Obesity, unspecified: Secondary | ICD-10-CM | POA: Diagnosis present

## 2023-05-12 DIAGNOSIS — Z90710 Acquired absence of both cervix and uterus: Secondary | ICD-10-CM

## 2023-05-12 DIAGNOSIS — N1832 Chronic kidney disease, stage 3b: Secondary | ICD-10-CM

## 2023-05-12 DIAGNOSIS — J449 Chronic obstructive pulmonary disease, unspecified: Secondary | ICD-10-CM

## 2023-05-12 DIAGNOSIS — E118 Type 2 diabetes mellitus with unspecified complications: Secondary | ICD-10-CM | POA: Diagnosis not present

## 2023-05-12 DIAGNOSIS — Z8249 Family history of ischemic heart disease and other diseases of the circulatory system: Secondary | ICD-10-CM

## 2023-05-12 DIAGNOSIS — N3000 Acute cystitis without hematuria: Secondary | ICD-10-CM | POA: Diagnosis not present

## 2023-05-12 DIAGNOSIS — I10 Essential (primary) hypertension: Secondary | ICD-10-CM | POA: Diagnosis present

## 2023-05-12 DIAGNOSIS — D649 Anemia, unspecified: Secondary | ICD-10-CM | POA: Diagnosis not present

## 2023-05-12 DIAGNOSIS — E1151 Type 2 diabetes mellitus with diabetic peripheral angiopathy without gangrene: Secondary | ICD-10-CM | POA: Diagnosis present

## 2023-05-12 DIAGNOSIS — I129 Hypertensive chronic kidney disease with stage 1 through stage 4 chronic kidney disease, or unspecified chronic kidney disease: Secondary | ICD-10-CM | POA: Diagnosis present

## 2023-05-12 DIAGNOSIS — Z1152 Encounter for screening for COVID-19: Secondary | ICD-10-CM

## 2023-05-12 DIAGNOSIS — K2971 Gastritis, unspecified, with bleeding: Principal | ICD-10-CM | POA: Diagnosis present

## 2023-05-12 DIAGNOSIS — Z79899 Other long term (current) drug therapy: Secondary | ICD-10-CM | POA: Diagnosis not present

## 2023-05-12 DIAGNOSIS — D75839 Thrombocytosis, unspecified: Secondary | ICD-10-CM | POA: Diagnosis present

## 2023-05-12 DIAGNOSIS — D509 Iron deficiency anemia, unspecified: Secondary | ICD-10-CM | POA: Diagnosis present

## 2023-05-12 DIAGNOSIS — R7881 Bacteremia: Secondary | ICD-10-CM | POA: Diagnosis present

## 2023-05-12 DIAGNOSIS — L930 Discoid lupus erythematosus: Secondary | ICD-10-CM | POA: Diagnosis present

## 2023-05-12 DIAGNOSIS — N179 Acute kidney failure, unspecified: Secondary | ICD-10-CM | POA: Diagnosis present

## 2023-05-12 DIAGNOSIS — Z888 Allergy status to other drugs, medicaments and biological substances status: Secondary | ICD-10-CM

## 2023-05-12 DIAGNOSIS — K922 Gastrointestinal hemorrhage, unspecified: Secondary | ICD-10-CM | POA: Diagnosis not present

## 2023-05-12 DIAGNOSIS — Z6832 Body mass index (BMI) 32.0-32.9, adult: Secondary | ICD-10-CM

## 2023-05-12 DIAGNOSIS — D62 Acute posthemorrhagic anemia: Secondary | ICD-10-CM | POA: Diagnosis present

## 2023-05-12 DIAGNOSIS — Z89611 Acquired absence of right leg above knee: Secondary | ICD-10-CM

## 2023-05-12 DIAGNOSIS — I739 Peripheral vascular disease, unspecified: Secondary | ICD-10-CM

## 2023-05-12 DIAGNOSIS — I272 Pulmonary hypertension, unspecified: Secondary | ICD-10-CM | POA: Diagnosis present

## 2023-05-12 DIAGNOSIS — E1122 Type 2 diabetes mellitus with diabetic chronic kidney disease: Secondary | ICD-10-CM | POA: Diagnosis present

## 2023-05-12 DIAGNOSIS — Z8701 Personal history of pneumonia (recurrent): Secondary | ICD-10-CM

## 2023-05-12 LAB — COMPREHENSIVE METABOLIC PANEL
ALT: 12 U/L (ref 0–44)
AST: 20 U/L (ref 15–41)
Albumin: 3.3 g/dL — ABNORMAL LOW (ref 3.5–5.0)
Alkaline Phosphatase: 54 U/L (ref 38–126)
Anion gap: 11 (ref 5–15)
BUN: 32 mg/dL — ABNORMAL HIGH (ref 8–23)
CO2: 19 mmol/L — ABNORMAL LOW (ref 22–32)
Calcium: 8.8 mg/dL — ABNORMAL LOW (ref 8.9–10.3)
Chloride: 101 mmol/L (ref 98–111)
Creatinine, Ser: 1.8 mg/dL — ABNORMAL HIGH (ref 0.44–1.00)
GFR, Estimated: 28 mL/min — ABNORMAL LOW (ref >60)
Glucose, Bld: 135 mg/dL — ABNORMAL HIGH (ref 70–99)
Potassium: 4.1 mmol/L (ref 3.5–5.1)
Sodium: 131 mmol/L — ABNORMAL LOW (ref 135–145)
Total Bilirubin: 0.6 mg/dL (ref 0.3–1.2)
Total Protein: 7.9 g/dL (ref 6.5–8.1)

## 2023-05-12 LAB — URINALYSIS, W/ REFLEX TO CULTURE (INFECTION SUSPECTED)
Bilirubin Urine: NEGATIVE
Glucose, UA: NEGATIVE mg/dL
Ketones, ur: NEGATIVE mg/dL
Nitrite: NEGATIVE
Protein, ur: 100 mg/dL — AB
Specific Gravity, Urine: 1.015 (ref 1.005–1.030)
WBC, UA: >50 WBC/hpf (ref 0–5)
pH: 5.5 (ref 5.0–8.0)

## 2023-05-12 LAB — CBC WITH DIFFERENTIAL/PLATELET
Abs Immature Granulocytes: 0.11 10*3/uL — ABNORMAL HIGH (ref 0.00–0.07)
Basophils Absolute: 0.1 10*3/uL (ref 0.0–0.1)
Basophils Relative: 0 %
Eosinophils Absolute: 0.2 10*3/uL (ref 0.0–0.5)
Eosinophils Relative: 1 %
HCT: 20.6 % — ABNORMAL LOW (ref 36.0–46.0)
Hemoglobin: 6.4 g/dL — CL (ref 12.0–15.0)
Immature Granulocytes: 1 %
Lymphocytes Relative: 5 %
Lymphs Abs: 0.9 10*3/uL (ref 0.7–4.0)
MCH: 22.6 pg — ABNORMAL LOW (ref 26.0–34.0)
MCHC: 31.1 g/dL (ref 30.0–36.0)
MCV: 72.8 fL — ABNORMAL LOW (ref 80.0–100.0)
Monocytes Absolute: 1 10*3/uL (ref 0.1–1.0)
Monocytes Relative: 6 %
Neutro Abs: 15.6 10*3/uL — ABNORMAL HIGH (ref 1.7–7.7)
Neutrophils Relative %: 87 %
Platelets: 436 10*3/uL — ABNORMAL HIGH (ref 150–400)
RBC: 2.83 MIL/uL — ABNORMAL LOW (ref 3.87–5.11)
RDW: 20.5 % — ABNORMAL HIGH (ref 11.5–15.5)
WBC: 17.9 10*3/uL — ABNORMAL HIGH (ref 4.0–10.5)
nRBC: 0 % (ref 0.0–0.2)

## 2023-05-12 LAB — PROTIME-INR
INR: 1.3 — ABNORMAL HIGH (ref 0.8–1.2)
Prothrombin Time: 16.4 seconds — ABNORMAL HIGH (ref 11.4–15.2)

## 2023-05-12 LAB — OCCULT BLOOD X 1 CARD TO LAB, STOOL: Fecal Occult Bld: POSITIVE — AB

## 2023-05-12 LAB — RETICULOCYTES
Immature Retic Fract: 32 % — ABNORMAL HIGH (ref 2.3–15.9)
RBC.: 2.56 MIL/uL — ABNORMAL LOW (ref 3.87–5.11)
Retic Count, Absolute: 127.2 10*3/uL (ref 19.0–186.0)
Retic Ct Pct: 5 % — ABNORMAL HIGH (ref 0.4–3.1)

## 2023-05-12 LAB — LACTIC ACID, PLASMA: Lactic Acid, Venous: 1 mmol/L (ref 0.5–1.9)

## 2023-05-12 LAB — APTT: aPTT: 38 seconds — ABNORMAL HIGH (ref 24–36)

## 2023-05-12 LAB — SARS CORONAVIRUS 2 BY RT PCR: SARS Coronavirus 2 by RT PCR: NEGATIVE

## 2023-05-12 MED ORDER — SODIUM CHLORIDE 0.9 % IV SOLN
2.0000 g | Freq: Once | INTRAVENOUS | Status: AC
  Administered 2023-05-12: 2 g via INTRAVENOUS
  Filled 2023-05-12: qty 20

## 2023-05-12 MED ORDER — ACETAMINOPHEN 325 MG PO TABS
650.0000 mg | ORAL_TABLET | Freq: Four times a day (QID) | ORAL | Status: DC | PRN
  Administered 2023-05-13: 650 mg via ORAL
  Filled 2023-05-12: qty 2

## 2023-05-12 MED ORDER — ACETAMINOPHEN 650 MG RE SUPP: 650.0000 mg | Freq: Four times a day (QID) | RECTAL | Status: DC | PRN

## 2023-05-12 MED ORDER — ONDANSETRON HCL 4 MG/2ML IJ SOLN
4.0000 mg | Freq: Four times a day (QID) | INTRAMUSCULAR | Status: DC | PRN
  Administered 2023-05-13: 4 mg via INTRAVENOUS
  Filled 2023-05-12: qty 2

## 2023-05-12 MED ORDER — PANTOPRAZOLE SODIUM 40 MG IV SOLR
40.0000 mg | Freq: Once | INTRAVENOUS | Status: AC
  Administered 2023-05-12: 40 mg via INTRAVENOUS
  Filled 2023-05-12: qty 10

## 2023-05-12 MED ORDER — SODIUM CHLORIDE 0.9 % IV SOLN
1.0000 g | INTRAVENOUS | Status: DC
  Filled 2023-05-12: qty 10

## 2023-05-12 MED ORDER — LACTATED RINGERS IV BOLUS
1000.0000 mL | Freq: Once | INTRAVENOUS | Status: AC
  Administered 2023-05-12: 1000 mL via INTRAVENOUS

## 2023-05-12 MED ORDER — SODIUM CHLORIDE 0.9% IV SOLUTION: Freq: Once | INTRAVENOUS | Status: DC

## 2023-05-12 MED ORDER — PANTOPRAZOLE SODIUM 40 MG IV SOLR
40.0000 mg | Freq: Two times a day (BID) | INTRAVENOUS | Status: DC
  Administered 2023-05-13 – 2023-05-17 (×9): 40 mg via INTRAVENOUS
  Filled 2023-05-12 (×9): qty 10

## 2023-05-12 MED ORDER — ALBUTEROL SULFATE HFA 108 (90 BASE) MCG/ACT IN AERS: 2.0000 | INHALATION_SPRAY | RESPIRATORY_TRACT | Status: DC | PRN

## 2023-05-12 MED ORDER — ACETAMINOPHEN 325 MG PO TABS
650.0000 mg | ORAL_TABLET | Freq: Once | ORAL | Status: AC
  Administered 2023-05-12: 650 mg via ORAL
  Filled 2023-05-12: qty 2

## 2023-05-12 NOTE — H&P (Incomplete)
PCP:   [name], DO   Chief Complaint:  Fever, weakness  HPI: This is a 82 year old female with PMHx of IDA, HTN, T2DM, CKD stage IIIb, moderate COPD, cutaneous lupus, PAD sp R AKA, PVD, wheelchair-bound.  Today she woke feeling weak and short of breath.  She was febrile and with episodes of altered mentation.  She endorses fever but no chills or burning urination.  She denies chest pains, being lightheaded or dizzy.  Her stool has been dark-colored however patient maintained on iron.  She denies weight loss or abdominal pain.  At [facility] patients intake vitals was 155/43, 77, 24, 100.6.  WBC 17.9, Hgb 6.4,  platelets 437, MCV 72.8, COVID-negative, fecal occult blood positive, UA consistent with acute cystitis  Blood and urine cultures x 2 collected.  Patient given 1 L LR bolus, 2 g IV Rocephin and 40 mg IV Protonix given.  Patient has a known history of iron deficiency anemia.  Her last colonoscopy was 1 to 2 years ago.  Some vessels were cauterized.  She had a capsule study done around the same time, it was unrevealing.  She follows with GI at [facility].  She gets an iron infusion periodically.  She had iron transfusion last Tuesday, and is scheduled for another this Tuesday.  [facility].  She denies spicy foods, NSAIDs or GERD.  Patient accepted in transfer  Review of Systems:  Per HPI   Past Medical History: Past Medical History:  Diagnosis Date   Anemia    Aortic regurgitation    moderate AR by 11/25/15 echo   Arthritis    Chronic kidney disease    Diabetes mellitus without complication (HCC)    History of blood transfusion    History of bronchitis    History of pneumonia    Hypertension    Peripheral arterial disease (HCC)    Peripheral vascular disease (HCC)    Pulmonary hypertension (HCC)    11/25/15 echo: PA systolic pressure severely increaed, PA peak pressure 66 mm Hg.    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   SOB (shortness of breath)    Stress incontinence    Past Surgical History:  Procedure Laterality Date   ABDOMINAL AORTOGRAM W/LOWER EXTREMITY N/A 10/22/2016   Procedure: Abdominal Aortogram w/Lower Extremity;  Surgeon: Chuck Hint, MD;  Location: [facility] INVASIVE CV LAB;  Service: Cardiovascular;  Laterality: N/A;  lt leg   ABDOMINAL HYSTERECTOMY     AMPUTATION Right 12/23/2015   Procedure: RIGHT  ABOVE KNEE AMPUTATION;  Surgeon: Chuck Hint, MD;  Location: [facility] OR;  Service: Vascular;  Laterality: Right;   BACK SURGERY  2002   COLONOSCOPY N/A 11/24/2015   Procedure: COLONOSCOPY;  Surgeon: Bernette Redbird, MD;  Location: [facility] ENDOSCOPY;  Service: Endoscopy;  Laterality: N/A;   ESOPHAGOGASTRODUODENOSCOPY (EGD) WITH PROPOFOL N/A 11/23/2015   Procedure: ESOPHAGOGASTRODUODENOSCOPY (EGD) WITH PROPOFOL;  Surgeon: Bernette Redbird, MD;  Location: [facility] ENDOSCOPY;  Service: Endoscopy;  Laterality: N/A;   femoral artery arteriogram  04/20/2015   GIVENS CAPSULE STUDY N/A 11/26/2015   Procedure: GIVENS CAPSULE STUDY;  Surgeon: Bernette Redbird, MD;  Location[facility] ENDOSCOPY;  Service: Endoscopy;  Laterality: N/A;    Medications: Prior to Admission medications   Medication Sig Start Date End Date Taking? Authorizing Provider  albuterol (PROVENTIL HFA;VENTOLIN HFA) 108 (90 Base) MCG/ACT inhaler Inhale 2 puffs into the lungs every 6 (six) hours as needed for wheezing or shortness  of breath.    [provider]  amLODipine (NORVASC) 10 MG tablet Take 1 tablet by mouth daily. 06/06/20   [provider]  aspirin EC 81 MG tablet Take 81 mg by mouth daily.     [provider]  atorvastatin (LIPITOR) 10 MG tablet TAKE ONE TABLET BY MOUTH ONE TIME DAILY 05/28/19   [provider]  Cholecalciferol 1000 units tablet Take 1,000 Units by mouth daily.    [provider]  docusate sodium (COLACE) 100 MG capsule Take 100 mg by mouth daily as needed (constipation).     [provider]  ferrous sulfate 325 (65 FE) MG tablet Take 1 tablet (325 mg total) by mouth 2 (two) times daily with a meal. 01/05/16   Love, Evlyn Kanner, PA-C  hydrochlorothiazide (MICROZIDE) 12.5 MG capsule Take by mouth. 09/29/19   [provider]  lactose free nutrition (BOOST) LIQD Take 237 mLs by mouth daily as needed (occasionally).     [provider]  lisinopril (PRINIVIL,ZESTRIL) 10 MG tablet Take 10 mg by mouth daily.    [provider]  lisinopril (ZESTRIL) 20 MG tablet Take 20 mg by mouth daily. 06/16/20   [provider]  losartan (COZAAR) 100 MG tablet Take 100 mg by mouth daily. 08/29/20   [provider]  metFORMIN (GLUCOPHAGE) 500 MG tablet Take 500 mg by mouth daily with breakfast. Reported on 03/14/2016    [provider]  ondansetron (ZOFRAN-ODT) 4 MG disintegrating tablet Take 1 tablet (4 mg total) by mouth every 8 (eight) hours as needed for nausea or vomiting. 05/01/23   Alvira Monday, MD    Allergies:   Allergies  Allergen Reactions   Gabapentin Other (See Comments)    Per patient, 300mg  dose made her extremely weak and unable to move. Ok with lower doses.    Social History:  reports that she quit smoking about 9 years ago. Her smoking use included cigarettes. She has never used smokeless tobacco. She reports that she does not drink alcohol and does not use drugs.  Family History: Family History  Problem Relation Age of Onset   Heart disease Father     Physical Exam: Vitals:   05/12/23 2000 05/12/23 2030 05/12/23 2056 05/12/23 2227  BP: (!) 135/39 (!) 133/37  (!) 153/45  Pulse: 71 69  69  Resp: (!) 26 (!) 24  16  Temp:   98.5 F (36.9 C) 98.9 F (37.2 C)  TempSrc:   Oral   SpO2: 93% 92%  99%  Weight:      Height:        General: A and O x 3, well developed and nourished, no acute distress Eyes: Pink conjunctiva, no scleral icterus ENT: Moist oral mucosa, neck supple, no  thyromegaly Lungs: CTA B/L, no wheeze, no crackles, no use of accessory muscles Cardiovascular: RRR, no regurgitation, no gallops, no murmurs. No carotid bruits, no JVD Abdomen: soft, positive BS, NTND, no organomegaly, not an acute abdomen GU: not examined Neuro: CN II - XII grossly intact, sensation intact Musculoskeletal: strength 5/5 all extremities. Right lower extremity AKA Skin: no rash, no subcutaneous crepitation, no decubitus Psych: appropriate patient   Labs on Admission:  Recent Labs    05/12/23 1840  NA 131*  K 4.1  CL 101  CO2 19*  GLUCOSE 135*  BUN 32*  CREATININE 1.80*  CALCIUM 8.8*   Recent Labs    05/12/23 1840  AST 20  ALT 12  ALKPHOS 54  BILITOT 0.6  PROT 7.9  ALBUMIN 3.3*    Recent Labs    05/12/23 1840  WBC 17.9*  NEUTROABS 15.6*  HGB 6.4*  HCT 20.6*  MCV 72.8*  PLT 436*    Recent Labs    05/12/23 1934  RETICCTPCT 5.0*    Micro Results: Recent Results (from the past 240 hour(s))  SARS Coronavirus 2 by RT PCR (hospital order, performed in [facility] hospital lab) *cepheid single result test* Anterior Nasal Swab     Status: None   Collection Time: 05/12/23  5:52 PM   Specimen: Anterior Nasal Swab  Result Value Ref Range Status   SARS Coronavirus 2 by RT PCR NEGATIVE NEGATIVE Final    Comment: (NOTE) SARS-CoV-2 target nucleic acids are NOT DETECTED.  The SARS-CoV-2 RNA is generally detectable in upper and lower respiratory specimens during the acute phase of infection. The lowest concentration of SARS-CoV-2 viral copies this assay can detect is 250 copies / mL. A negative result does not preclude SARS-CoV-2 infection and should not be used as the sole basis for treatment or other patient management decisions.  A negative result may occur with improper specimen collection / handling, submission of specimen other than nasopharyngeal swab, presence of viral mutation(s) within the areas targeted by this assay, and inadequate number  of viral copies (<250 copies / mL). A negative result must be combined with clinical observations, patient history, and epidemiological information.  Fact Sheet for Patients:   RoadLapTop.co.za  Fact Sheet for Healthcare Providers: http://kim-miller.com/  This test is not yet approved or  cleared by the [place] FDA and has been authorized for detection and/or diagnosis of SARS-CoV-2 by FDA under an Emergency Use Authorization (EUA).  This EUA will remain in effect (meaning this test can be used) for the duration of the COVID-19 declaration under Section 564(b)(1) of the Act, 21 U.S.C. section 360bbb-3(b)(1), unless the authorization is terminated or revoked sooner.  Performed at [facility], [address] [place], [place] [address]      Radiological Exams on Admission: DG Chest Portable 1 View  Result Date: 05/12/2023 CLINICAL DATA:  Shortness of breath. EXAM: PORTABLE CHEST 1 VIEW COMPARISON:  May 01, 2023 FINDINGS: The heart size and mediastinal contours are within normal limits. Both lungs are clear. Multilevel degenerative changes seen throughout the thoracic spine. IMPRESSION: No active disease. Electronically Signed   By: Aram Candela M.D.   On: 05/12/2023 18:53    Assessment/Plan Present on Admission:  Symptomatic anemia // Iron deficiency anemia //  Acute GI bleeding -IV Protonix every 12 -H&H Q8.  Posttransfusion H&H -Transfuse 2 units packed red blood cells -N.p.o. -GI consult per a.m. team.  Patient with known history of IDA.  In 2017, patient's hemoglobin 3.3.  Followed with GI outpatient -IV iron ordered -Baby aspirin on hold   Acute cystitis -Urine cultures collected -Continue IV Rocephin   CKD stage 3B -Stable at baseline   Benign essential HTN -Continue Norvasc, Cozaar resumed -Atenolol 50 mg daily, HCTZ 12.5 mg daily to be resumed based on titration of BP   PAD  PVD (peripheral  vascular disease) (HCC)  Thrombocytosis  Latoiya Maradiaga 05/12/2023, 11:03 PM

## 2023-05-12 NOTE — ED Notes (Signed)
Primary RN aware of hgb 6.4.

## 2023-05-12 NOTE — ED Provider Notes (Signed)
EMERGENCY DEPARTMENT AT [facility] Provider Note   CSN: [id number] Arrival date & time: 05/12/23  1738     History  Chief Complaint  Patient presents with   Shortness of Breath   Fatigue    Jessica Abbott is a 82 y.o. female.  HPI 82 year old female presents with fever, confusion, shortness of breath.  Symptoms started today.  She had similar presentation and came to the ER on 8/14.  Just finished the antibiotics today.  Is developing recurrent symptoms.  At that point she had been treated for a UTI with cephalexin.  Developed a temperature of 100.4 and the daughter felt like she was answering questions in a weird way that was not typical and seemed like she was a little confused.  On the way here she was doing a little better.  She is also been a little short of breath.  She has a chronic cough and chronic right leg pain from a AKA but no new pain or redness.  Has not received anything for the fever.  Home Medications Prior to Admission medications   Medication Sig Start Date End Date Taking? Authorizing Provider  albuterol (PROVENTIL HFA;VENTOLIN HFA) 108 (90 Base) MCG/ACT inhaler Inhale 2 puffs into the lungs every 6 (six) hours as needed for wheezing or shortness of breath.    [provider]  amLODipine (NORVASC) 10 MG tablet Take 1 tablet by mouth daily. 06/06/20   [provider]  aspirin EC 81 MG tablet Take 81 mg by mouth daily.     [provider]  atorvastatin (LIPITOR) 10 MG tablet TAKE ONE TABLET BY MOUTH ONE TIME DAILY 05/28/19   [provider]  Cholecalciferol 1000 units tablet Take 1,000 Units by mouth daily.    [provider]  docusate sodium (COLACE) 100 MG capsule Take 100 mg by mouth daily as needed (constipation).    [provider]  ferrous sulfate 325 (65 FE) MG tablet Take 1 tablet (325 mg total) by mouth 2 (two) times daily with a meal. 01/05/16   Love, Evlyn Kanner, PA-C   hydrochlorothiazide (MICROZIDE) 12.5 MG capsule Take by mouth. 09/29/19   [provider]  lactose free nutrition (BOOST) LIQD Take 237 mLs by mouth daily as needed (occasionally).     [provider]  lisinopril (PRINIVIL,ZESTRIL) 10 MG tablet Take 10 mg by mouth daily.    [provider]  lisinopril (ZESTRIL) 20 MG tablet Take 20 mg by mouth daily. 06/16/20   [provider]  losartan (COZAAR) 100 MG tablet Take 100 mg by mouth daily. 08/29/20   [provider]  metFORMIN (GLUCOPHAGE) 500 MG tablet Take 500 mg by mouth daily with breakfast. Reported on 03/14/2016    [provider]  ondansetron (ZOFRAN-ODT) 4 MG disintegrating tablet Take 1 tablet (4 mg total) by mouth every 8 (eight) hours as needed for nausea or vomiting. 05/01/23   Alvira Monday, MD      Allergies    Gabapentin    Review of Systems   Review of Systems  Constitutional:  Positive for fever.  Respiratory:  Positive for cough (chronic) and shortness of breath.   Cardiovascular:  Negative for chest pain.  Gastrointestinal:  Negative for abdominal pain and vomiting.  Genitourinary:  Negative for dysuria.  Musculoskeletal:  Negative for neck pain.  Neurological:  Negative for headaches.    Physical Exam Updated Vital Signs BP (!) 153/45 (BP Location: Left Arm)   Pulse 69  Temp 98.9 F (37.2 C)   Resp 16   Ht 5\' 2"  (1.575 m)   Wt 79.4 kg   SpO2 99%   BMI 32.01 kg/m  Physical Exam Vitals and nursing note reviewed. Exam conducted with a chaperone present.  Constitutional:      General: She is not in acute distress.    Appearance: She is well-developed. She is not ill-appearing or diaphoretic.  HENT:     Head: Normocephalic and atraumatic.  Cardiovascular:     Rate and Rhythm: Normal rate and regular rhythm.     Heart sounds: Murmur (soft, systolic) heard.  Pulmonary:     Effort: Pulmonary effort is normal.     Breath sounds: Normal breath sounds.   Abdominal:     Palpations: Abdomen is soft.     Tenderness: There is no abdominal tenderness.  Genitourinary:    Comments: Small external skin tag. Dark stool on DRE Musculoskeletal:     Cervical back: Normal range of motion. No rigidity.     Comments: No cellulitis in either lower extremity. Right stump is not tender or swollen  Skin:    General: Skin is warm and dry.  Neurological:     Mental Status: She is alert and oriented to person, place, and time.     ED Results / Procedures / Treatments   Labs (all labs ordered are listed, but only abnormal results are displayed) Labs Reviewed  COMPREHENSIVE METABOLIC PANEL - Abnormal; Notable for the following components:      Result Value   Sodium 131 (*)    CO2 19 (*)    Glucose, Bld 135 (*)    BUN 32 (*)    Creatinine, Ser 1.80 (*)    Calcium 8.8 (*)    Albumin 3.3 (*)    GFR, Estimated 28 (*)    All other components within normal limits  CBC WITH DIFFERENTIAL/PLATELET - Abnormal; Notable for the following components:   WBC 17.9 (*)    RBC 2.83 (*)    Hemoglobin 6.4 (*)    HCT 20.6 (*)    MCV 72.8 (*)    MCH 22.6 (*)    RDW 20.5 (*)    Platelets 436 (*)    Neutro Abs 15.6 (*)    Abs Immature Granulocytes 0.11 (*)    All other components within normal limits  PROTIME-INR - Abnormal; Notable for the following components:   Prothrombin Time 16.4 (*)    INR 1.3 (*)    All other components within normal limits  APTT - Abnormal; Notable for the following components:   aPTT 38 (*)    All other components within normal limits  URINALYSIS, W/ REFLEX TO CULTURE (INFECTION SUSPECTED) - Abnormal; Notable for the following components:   APPearance CLOUDY (*)    Hgb urine dipstick SMALL (*)    Protein, ur 100 (*)    Leukocytes,Ua LARGE (*)    Bacteria, UA FEW (*)    All other components within normal limits  OCCULT BLOOD X 1 CARD TO LAB, STOOL - Abnormal; Notable for the following components:   Fecal Occult Bld POSITIVE (*)     All other components within normal limits  RETICULOCYTES - Abnormal; Notable for the following components:   Retic Ct Pct 5.0 (*)    RBC. 2.56 (*)    Immature Retic Fract 32.0 (*)    All other components within normal limits  SARS CORONAVIRUS 2 BY RT PCR  CULTURE, BLOOD (ROUTINE X 2)  CULTURE, BLOOD (ROUTINE X 2)  URINE CULTURE  LACTIC ACID, PLASMA  VITAMIN B12  FOLATE  IRON AND TIBC  FERRITIN  BASIC METABOLIC PANEL  MAGNESIUM  CBC WITH DIFFERENTIAL/PLATELET  TYPE AND SCREEN  PREPARE RBC (CROSSMATCH)    EKG EKG Interpretation Date/Time:  Sunday May 12 2023 17:54:12 EDT Ventricular Rate:  76 PR Interval:  188 QRS Duration:  142 QT Interval:  414 QTC Calculation: 465 R Axis:   46  Text Interpretation: Normal sinus rhythm Left bundle branch block Abnormal ECG similar to May 01 2023 Confirmed by Pricilla Loveless [phone]) on 05/12/2023 6:12:10 PM  Radiology DG Chest Portable 1 View  Result Date: 05/12/2023 CLINICAL DATA:  Shortness of breath. EXAM: PORTABLE CHEST 1 VIEW COMPARISON:  May 01, 2023 FINDINGS: The heart size and mediastinal contours are within normal limits. Both lungs are clear. Multilevel degenerative changes seen throughout the thoracic spine. IMPRESSION: No active disease. Electronically Signed   By: Aram Candela M.D.   On: 05/12/2023 18:53    Procedures .Critical Care  Performed by: Pricilla Loveless, MD Authorized by: Pricilla Loveless, MD   Critical care provider statement:    Critical care time (minutes):  35   Critical care time was exclusive of:  Separately billable procedures and treating other patients   Critical care was necessary to treat or prevent imminent or life-threatening deterioration of the following conditions:  Sepsis and circulatory failure   Critical care was time spent personally by me on the following activities:  Development of treatment plan with patient or surrogate, discussions with consultants, evaluation of patient's  response to treatment, examination of patient, ordering and review of laboratory studies, ordering and review of radiographic studies, ordering and performing treatments and interventions, pulse oximetry, re-evaluation of patient's condition and review of old charts     Medications Ordered in ED Medications  0.9 %  sodium chloride infusion (Manually program via Guardrails IV Fluids) (has no administration in time range)  ondansetron (ZOFRAN) injection 4 mg (has no administration in time range)  pantoprazole (PROTONIX) injection 40 mg (has no administration in time range)  cefTRIAXone (ROCEPHIN) 1 g in sodium chloride 0.9 % 100 mL IVPB (has no administration in time range)  acetaminophen (TYLENOL) tablet 650 mg (has no administration in time range)    Or  acetaminophen (TYLENOL) suppository 650 mg (has no administration in time range)  acetaminophen (TYLENOL) tablet 650 mg (650 mg Oral Given 05/12/23 1847)  lactated ringers bolus 1,000 mL (0 mLs Intravenous Stopped 05/12/23 1944)  cefTRIAXone (ROCEPHIN) 2 g in sodium chloride 0.9 % 100 mL IVPB (0 g Intravenous Stopped 05/12/23 2049)  pantoprazole (PROTONIX) injection 40 mg (40 mg Intravenous Given 05/12/23 2001)    ED Course/ Medical Decision Making/ A&P                                 Medical Decision Making Amount and/or Complexity of Data Reviewed Labs: ordered.    Details: Leukocytosis of 17 with a new acute on chronic anemia of 6.4. Radiology: ordered and independent interpretation performed.    Details: No pneumonia. ECG/medicine tests: ordered and independent interpretation performed.    Details: Left bundle branch block.  Risk OTC drugs. Prescription drug management. Decision regarding hospitalization.   Patient presents with low-grade fever and weakness.  Found to have a significant leukocytosis which is probably from a UTI which seem to be the cause about 10 days ago.  However she also has acute on chronic anemia with  Hemoccult positive stools.  Seems to have had previous Hemoccult positive stools but with a new hemoglobin of 6.4 will give Protonix.  We do not have blood at this facility so she will need to be admitted to a different facility and I have discussed with Dr. Antionette Char for admission.  She was given Rocephin for the fever.  Of note, I did look at the blood cultures from last visit and 1 was positive for Micrococcus luteus.  Unclear if this was contaminant or not.  We have recent cultures here.  Either way, her lactate is okay and her vitals are okay.  She will need admission.        Final Clinical Impression(s) / ED Diagnoses Final diagnoses:  Symptomatic anemia  Febrile urinary tract infection    Rx / DC Orders ED Discharge Orders     None         Pricilla Loveless, MD 05/12/23 2255

## 2023-05-12 NOTE — ED Triage Notes (Signed)
The patient is having shortness of breath that started today, fatigue, pain in her right leg. 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 She is having a cough.

## 2023-05-12 NOTE — ED Notes (Signed)
ED TO INPATIENT HANDOFF REPORT  ED Nurse Name and Phone #: Evette Cristal, RN [phone]  S Name/Age/Gender Jessica Abbott 82 y.o. female Room/Bed: MH09/MH09  Code Status   Code Status: Prior  Home/SNF/Other Home Patient oriented to: self, place, time, and situation Is this baseline? Yes   Triage Complete: Triage complete  Chief Complaint Symptomatic anemia [D64.9]  Triage Note The patient is having shortness of breath that started today, fatigue, pain in her right leg. 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 She is having a cough.   Allergies Allergies  Allergen Reactions   Gabapentin Other (See Comments)    Per patient, 300mg  dose made her extremely weak and unable to move. Ok with lower doses.    Level of Care/Admitting Diagnosis ED Disposition     ED Disposition  Admit   Condition  --   Comment  Hospital Area: [facility] [[id number]]  Level of Care: Telemetry [5]  Admit to tele based on following criteria: Monitor QTC interval  May admit patient to [facility] or [facility] if equivalent level of care is available:: Yes  Interfacility transfer: Yes  Covid Evaluation: Confirmed COVID Negative  Diagnosis: Symptomatic anemia [[id number]]  Admitting Physician: Briscoe Deutscher [[id number]]  Attending Physician: Briscoe Deutscher [[id number]]  Certification:: I certify this patient will need inpatient services for at least 2 midnights  Expected Medical Readiness: 05/14/2023          B Medical/Surgery History Past Medical History:  Diagnosis Date   Anemia    Aortic regurgitation    moderate AR by 11/25/15 echo   Arthritis    Chronic kidney disease    Diabetes mellitus without complication (HCC)    History of blood transfusion    History of bronchitis    History of pneumonia    Hypertension    Peripheral arterial disease (HCC)    Peripheral vascular disease (HCC)    Pulmonary hypertension (HCC)    11/25/15 echo: PA systolic pressure severely increaed, PA  peak pressure 66 mm Hg.    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   SOB (shortness of breath)    Stress incontinence    Past Surgical History:  Procedure Laterality Date   ABDOMINAL AORTOGRAM W/LOWER EXTREMITY N/A 10/22/2016   Procedure: Abdominal Aortogram w/Lower Extremity;  Surgeon: Chuck Hint, MD;  Location: [facility] INVASIVE CV LAB;  Service: Cardiovascular;  Laterality: N/A;  lt leg   ABDOMINAL HYSTERECTOMY     AMPUTATION Right 12/23/2015   Procedure: RIGHT  ABOVE KNEE AMPUTATION;  Surgeon: Chuck Hint, MD;  Location: [facility] OR;  Service: Vascular;  Laterality: Right;   BACK SURGERY  2002   COLONOSCOPY N/A 11/24/2015   Procedure: COLONOSCOPY;  Surgeon: Bernette Redbird, MD;  Location: [facility] ENDOSCOPY;  Service: Endoscopy;  Laterality: N/A;   ESOPHAGOGASTRODUODENOSCOPY (EGD) WITH PROPOFOL N/A 11/23/2015   Procedure: ESOPHAGOGASTRODUODENOSCOPY (EGD) WITH PROPOFOL;  Surgeon: Bernette Redbird, MD;  Location: [facility] ENDOSCOPY;  Service: Endoscopy;  Laterality: N/A;   femoral artery arteriogram  04/20/2015   GIVENS CAPSULE STUDY N/A 11/26/2015   Procedure: GIVENS CAPSULE STUDY;  Surgeon: Bernette Redbird, MD;  Location: [facility] ENDOSCOPY;  Service: Endoscopy;  Laterality: N/A;     A IV Location/Drains/Wounds Patient Lines/Drains/Airways Status     Active Line/Drains/Airways     Name Placement date Placement time Site Days   Peripheral IV 05/12/23 20 G Right Antecubital 05/12/23  1835  Antecubital  less than 1   Pressure Ulcer  12/26/15 Stage II -  Partial thickness loss of dermis presenting as a shallow open ulcer with a red, pink wound bed without slough. 1.3 x 0.2 x 0.1, open area in gluteal fold, surrounding skin macerated 12/26/15  2100  -- 2694            Intake/Output Last 24 hours  Intake/Output Summary (Last 24 hours) at 05/12/2023 2100 Last data filed at 05/12/2023 2049 Gross per 24 hour  Intake 1098.71 ml  Output --  Net 1098.71 ml    Labs/Imaging Results  for orders placed or performed during the hospital encounter of 05/12/23 (from the past 48 hour(s))  SARS Coronavirus 2 by RT PCR (hospital order, performed in [facility] hospital lab) *cepheid single result test* Anterior Nasal Swab     Status: None   Collection Time: 05/12/23  5:52 PM   Specimen: Anterior Nasal Swab  Result Value Ref Range   SARS Coronavirus 2 by RT PCR NEGATIVE NEGATIVE    Comment: (NOTE) SARS-CoV-2 target nucleic acids are NOT DETECTED.  The SARS-CoV-2 RNA is generally detectable in upper and lower respiratory specimens during the acute phase of infection. The lowest concentration of SARS-CoV-2 viral copies this assay can detect is 250 copies / mL. A negative result does not preclude SARS-CoV-2 infection and should not be used as the sole basis for treatment or other patient management decisions.  A negative result may occur with improper specimen collection / handling, submission of specimen other than nasopharyngeal swab, presence of viral mutation(s) within the areas targeted by this assay, and inadequate number of viral copies (<250 copies / mL). A negative result must be combined with clinical observations, patient history, and epidemiological information.  Fact Sheet for Patients:   RoadLapTop.co.za  Fact Sheet for Healthcare Providers: http://kim-miller.com/  This test is not yet approved or  cleared by the [place] FDA and has been authorized for detection and/or diagnosis of SARS-CoV-2 by FDA under an Emergency Use Authorization (EUA).  This EUA will remain in effect (meaning this test can be used) for the duration of the COVID-19 declaration under Section 564(b)(1) of the Act, 21 U.S.C. section 360bbb-3(b)(1), unless the authorization is terminated or revoked sooner.  Performed at [facility], [address] [place], [place] [address]   Urinalysis, w/ Reflex to Culture (Infection  Suspected) -Urine, Clean Catch     Status: Abnormal   Collection Time: 05/12/23  6:24 PM  Result Value Ref Range   Specimen Source URINE, CLEAN CATCH    Color, Urine YELLOW YELLOW   APPearance CLOUDY (A) CLEAR   Specific Gravity, Urine 1.015 1.005 - 1.030   pH 5.5 5.0 - 8.0   Glucose, UA NEGATIVE NEGATIVE mg/dL   Hgb urine dipstick SMALL (A) NEGATIVE   Bilirubin Urine NEGATIVE NEGATIVE   Ketones, ur NEGATIVE NEGATIVE mg/dL   Protein, ur 604 (A) NEGATIVE mg/dL   Nitrite NEGATIVE NEGATIVE   Leukocytes,Ua LARGE (A) NEGATIVE   Squamous Epithelial / HPF 0-5 0 - 5 /HPF   WBC, UA >50 0 - 5 WBC/hpf    Comment: Reflex urine culture not performed if WBC <=10, OR if Squamous epithelial cells >5. If Squamous epithelial cells >5, suggest recollection.   RBC / HPF 0-5 0 - 5 RBC/hpf   Bacteria, UA FEW (A) NONE SEEN    Comment: Performed at [facility], [address] [place], [place] [address]  Lactic acid, plasma     Status: None  Collection Time: 05/12/23  6:40 PM  Result Value Ref Range   Lactic Acid, Venous 1.0 0.5 - 1.9 mmol/L    Comment: Performed at [facility], [address] [place], [place] [address]  Comprehensive metabolic panel     Status: Abnormal   Collection Time: 05/12/23  6:40 PM  Result Value Ref Range   Sodium 131 (L) 135 - 145 mmol/L   Potassium 4.1 3.5 - 5.1 mmol/L   Chloride 101 98 - 111 mmol/L   CO2 19 (L) 22 - 32 mmol/L   Glucose, Bld 135 (H) 70 - 99 mg/dL    Comment: Glucose reference range applies only to samples taken after fasting for at least 8 hours.   BUN 32 (H) 8 - 23 mg/dL   Creatinine, Ser 4.09 (H) 0.44 - 1.00 mg/dL   Calcium 8.8 (L) 8.9 - 10.3 mg/dL   Total Protein 7.9 6.5 - 8.1 g/dL   Albumin 3.3 (L) 3.5 - 5.0 g/dL   AST 20 15 - 41 U/L   ALT 12 0 - 44 U/L   Alkaline Phosphatase 54 38 - 126 U/L   Total Bilirubin 0.6 0.3 - 1.2 mg/dL   GFR, Estimated 28 (L) >60 mL/min    Comment: (NOTE) Calculated using the CKD-EPI Creatinine  Equation (2021)    Anion gap 11 5 - 15    Comment: Performed at [facility], [address] [place], [place] [address]  CBC with Differential     Status: Abnormal   Collection Time: 05/12/23  6:40 PM  Result Value Ref Range   WBC 17.9 (H) 4.0 - 10.5 K/uL   RBC 2.83 (L) 3.87 - 5.11 MIL/uL   Hemoglobin 6.4 (LL) 12.0 - 15.0 g/dL    Comment: REPEATED TO VERIFY Reticulocyte Hemoglobin testing may be clinically indicated, consider ordering this additional test [id number] THIS CRITICAL RESULT HAS VERIFIED AND BEEN CALLED TO V.POWELL RN BY CHRISTY REED ON 08 25 2024 AT 1904, AND HAS BEEN READ BACK.     HCT 20.6 (L) 36.0 - 46.0 %   MCV 72.8 (L) 80.0 - 100.0 fL   MCH 22.6 (L) 26.0 - 34.0 pg   MCHC 31.1 30.0 - 36.0 g/dL   RDW 13.0 (H) 86.5 - 78.4 %   Platelets 436 (H) 150 - 400 K/uL    Comment: SPECIMEN CHECKED FOR CLOTS   nRBC 0.0 0.0 - 0.2 %   Neutrophils Relative % 87 %   Neutro Abs 15.6 (H) 1.7 - 7.7 K/uL   Lymphocytes Relative 5 %   Lymphs Abs 0.9 0.7 - 4.0 K/uL   Monocytes Relative 6 %   Monocytes Absolute 1.0 0.1 - 1.0 K/uL   Eosinophils Relative 1 %   Eosinophils Absolute 0.2 0.0 - 0.5 K/uL   Basophils Relative 0 %   Basophils Absolute 0.1 0.0 - 0.1 K/uL   Immature Granulocytes 1 %   Abs Immature Granulocytes 0.11 (H) 0.00 - 0.07 K/uL    Comment: Performed at [facility], [address] [place], [place] [address]  Protime-INR     Status: Abnormal   Collection Time: 05/12/23  6:40 PM  Result Value Ref Range   Prothrombin Time 16.4 (H) 11.4 - 15.2 seconds   INR 1.3 (H) 0.8 - 1.2    Comment: (NOTE) INR goal varies based on device and disease states. Performed at [facility], [address], [place], [place] [address]  APTT     Status: Abnormal   Collection Time: 05/12/23  6:40 PM  Result Value Ref Range   aPTT 38 (H) 24 - 36 seconds    Comment:        IF BASELINE aPTT IS ELEVATED, SUGGEST PATIENT RISK ASSESSMENT BE USED TO  DETERMINE APPROPRIATE ANTICOAGULANT THERAPY. Performed at [facility], [address] [place], [place] [address]   Occult blood card to lab, stool     Status: Abnormal   Collection Time: 05/12/23  7:48 PM  Result Value Ref Range   Fecal Occult Bld POSITIVE (A) NEGATIVE    Comment: Performed at [facility], [address] [place], [place] [address]   DG Chest Portable 1 View  Result Date: 05/12/2023 CLINICAL DATA:  Shortness of breath. EXAM: PORTABLE CHEST 1 VIEW COMPARISON:  May 01, 2023 FINDINGS: The heart size and mediastinal contours are within normal limits. Both lungs are clear. Multilevel degenerative changes seen throughout the thoracic spine. IMPRESSION: No active disease. Electronically Signed   By: Aram Candela M.D.   On: 05/12/2023 18:53    Pending Labs Unresulted Labs (From admission, onward)     Start     Ordered   05/12/23 1934  Vitamin B12  (Anemia Panel (PNL))  Once,   URGENT        05/12/23 1933   05/12/23 1934  Folate  (Anemia Panel (PNL))  Once,   URGENT        05/12/23 1933   05/12/23 1934  Iron and TIBC  (Anemia Panel (PNL))  Once,   URGENT        05/12/23 1933   05/12/23 1934  Ferritin  (Anemia Panel (PNL))  Once,   URGENT        05/12/23 1933   05/12/23 1934  Reticulocytes  (Anemia Panel (PNL))  Once,   URGENT        05/12/23 1933   05/12/23 1824  Blood Culture (routine x 2)  (Undifferentiated presentation (screening labs and basic nursing orders))  BLOOD CULTURE X 2,   STAT      05/12/23 1823   05/12/23 1824  Urine Culture  Once,   R        05/12/23 1824            Vitals/Pain Today's Vitals   05/12/23 1944 05/12/23 2000 05/12/23 2030 05/12/23 2056  BP:  (!) 135/39 (!) 133/37   Pulse:  71 69   Resp:  (!) 26 (!) 24   Temp: (!) 100.4 F (38 C)   98.5 F (36.9 C)  TempSrc: Rectal   Oral  SpO2:  93% 92%   Weight:      Height:      PainSc:        Isolation Precautions Airborne and Contact  precautions  Medications Medications  acetaminophen (TYLENOL) tablet 650 mg (650 mg Oral Given 05/12/23 1847)  lactated ringers bolus 1,000 mL (0 mLs Intravenous Stopped 05/12/23 1944)  cefTRIAXone (ROCEPHIN) 2 g in sodium chloride 0.9 % 100 mL IVPB (0 g Intravenous Stopped 05/12/23 2049)  pantoprazole (PROTONIX) injection 40 mg (40 mg Intravenous Given 05/12/23 2001)    Mobility non-ambulatory     Focused Assessments Anemia and SOB due to HGB 6.4    R Recommendations: See Admitting Provider Note  Report given to:   Additional Notes: Report given to Ammi on [address] [place]

## 2023-05-12 NOTE — ED Notes (Signed)
Lonnie from [facility] called for report.  Pt stable for transport ETA 30 min

## 2023-05-12 NOTE — Progress Notes (Signed)
Plan of Care Note for accepted transfer   Patient: Jessica Abbott MRN: [id number]   DOA: 05/12/2023  Facility requesting transfer: [facility]   Requesting Provider: Dr. Criss Alvine   Reason for transfer: SIRS, symptomatic anemia   Facility course: 82 year old female with HTN, T2DM, IDA, and PAD who presents with 1 day of cough, shortness of breath, fatigue, and is found to be febrile with normal heart rate and stable blood pressure.    Creatinine is 1.80, WBC 17,900, lactic acid normal, COVID-19 PCR negative, and hemoglobin 6.4 (8.5 on 05/01/2023).  No acute findings noted on chest x-ray.  Fecal occult blood testing is positive.  Blood cultures were collected and the patient was treated with Rocephin, 1 L of LR, and IV Protonix.  Urinalysis is in process.  Plan of care: The patient is accepted for admission to Telemetry unit, at [facility].   Author: Briscoe Deutscher, MD 05/12/2023  Check [email or web address] for on-call coverage.  Nursing staff, Please call [facility] Admits & Consults System-Wide number on Amion as soon as patient's arrival, so appropriate admitting provider can evaluate the pt.

## 2023-05-13 DIAGNOSIS — D649 Anemia, unspecified: Secondary | ICD-10-CM | POA: Diagnosis not present

## 2023-05-13 LAB — CBC WITH DIFFERENTIAL/PLATELET
Abs Immature Granulocytes: 0.12 10*3/uL — ABNORMAL HIGH (ref 0.00–0.07)
Basophils Absolute: 0.1 10*3/uL (ref 0.0–0.1)
Basophils Relative: 1 %
Eosinophils Absolute: 0.2 10*3/uL (ref 0.0–0.5)
Eosinophils Relative: 1 %
HCT: 24.4 % — ABNORMAL LOW (ref 36.0–46.0)
Hemoglobin: 7.7 g/dL — ABNORMAL LOW (ref 12.0–15.0)
Immature Granulocytes: 1 %
Lymphocytes Relative: 4 %
Lymphs Abs: 0.9 10*3/uL (ref 0.7–4.0)
MCH: 24.9 pg — ABNORMAL LOW (ref 26.0–34.0)
MCHC: 31.6 g/dL (ref 30.0–36.0)
MCV: 79 fL — ABNORMAL LOW (ref 80.0–100.0)
Monocytes Absolute: 1.1 10*3/uL — ABNORMAL HIGH (ref 0.1–1.0)
Monocytes Relative: 5 %
Neutro Abs: 18.2 10*3/uL — ABNORMAL HIGH (ref 1.7–7.7)
Neutrophils Relative %: 88 %
Platelets: 342 10*3/uL (ref 150–400)
RBC: 3.09 MIL/uL — ABNORMAL LOW (ref 3.87–5.11)
RDW: 23.2 % — ABNORMAL HIGH (ref 11.5–15.5)
WBC: 20.7 10*3/uL — ABNORMAL HIGH (ref 4.0–10.5)
nRBC: 0 % (ref 0.0–0.2)

## 2023-05-13 LAB — COMPREHENSIVE METABOLIC PANEL
ALT: 11 U/L (ref 0–44)
AST: 17 U/L (ref 15–41)
Albumin: 3.1 g/dL — ABNORMAL LOW (ref 3.5–5.0)
Alkaline Phosphatase: 49 U/L (ref 38–126)
Anion gap: 13 (ref 5–15)
BUN: 29 mg/dL — ABNORMAL HIGH (ref 8–23)
CO2: 21 mmol/L — ABNORMAL LOW (ref 22–32)
Calcium: 9.4 mg/dL (ref 8.9–10.3)
Chloride: 104 mmol/L (ref 98–111)
Creatinine, Ser: 1.67 mg/dL — ABNORMAL HIGH (ref 0.44–1.00)
GFR, Estimated: 31 mL/min — ABNORMAL LOW (ref >60)
Glucose, Bld: 117 mg/dL — ABNORMAL HIGH (ref 70–99)
Potassium: 4.1 mmol/L (ref 3.5–5.1)
Sodium: 138 mmol/L (ref 135–145)
Total Bilirubin: 1 mg/dL (ref 0.3–1.2)
Total Protein: 7.2 g/dL (ref 6.5–8.1)

## 2023-05-13 LAB — BLOOD CULTURE ID PANEL (REFLEXED) - BCID2

## 2023-05-13 LAB — FOLATE: Folate: 29 ng/mL (ref >5.9)

## 2023-05-13 LAB — MAGNESIUM: Magnesium: 2 mg/dL (ref 1.7–2.4)

## 2023-05-13 LAB — FERRITIN: Ferritin: 346 ng/mL — ABNORMAL HIGH (ref 11–307)

## 2023-05-13 LAB — PHOSPHORUS: Phosphorus: 4.8 mg/dL — ABNORMAL HIGH (ref 2.5–4.6)

## 2023-05-13 LAB — VITAMIN B12: Vitamin B-12: 684 pg/mL (ref 180–914)

## 2023-05-13 LAB — IRON AND TIBC
Iron: 25 ug/dL — ABNORMAL LOW (ref 28–170)
Saturation Ratios: 11 % (ref 10.4–31.8)
TIBC: 231 ug/dL — ABNORMAL LOW (ref 250–450)
UIBC: 206 ug/dL

## 2023-05-13 LAB — PREPARE RBC (CROSSMATCH)

## 2023-05-13 MED ORDER — HYDROCHLOROTHIAZIDE 12.5 MG PO TABS
12.5000 mg | ORAL_TABLET | Freq: Every day | ORAL | Status: DC
  Administered 2023-05-15 – 2023-05-17 (×3): 12.5 mg via ORAL
  Filled 2023-05-13 (×3): qty 1

## 2023-05-13 MED ORDER — HYDROCHLOROTHIAZIDE 12.5 MG PO CAPS
12.5000 mg | ORAL_CAPSULE | Freq: Every day | ORAL | Status: DC
  Filled 2023-05-13: qty 1

## 2023-05-13 MED ORDER — GUAIFENESIN-DM 100-10 MG/5ML PO SYRP
5.0000 mL | ORAL_SOLUTION | ORAL | Status: DC | PRN
  Administered 2023-05-13 – 2023-05-14 (×2): 5 mL via ORAL
  Filled 2023-05-13 (×2): qty 5

## 2023-05-13 MED ORDER — LOSARTAN POTASSIUM 50 MG PO TABS
100.0000 mg | ORAL_TABLET | Freq: Every day | ORAL | Status: DC
  Administered 2023-05-15 – 2023-05-17 (×3): 100 mg via ORAL
  Filled 2023-05-13 (×3): qty 2

## 2023-05-13 MED ORDER — SODIUM CHLORIDE 0.9 % IV SOLN
510.0000 mg | Freq: Once | INTRAVENOUS | Status: AC
  Administered 2023-05-13: 510 mg via INTRAVENOUS
  Filled 2023-05-13: qty 17

## 2023-05-13 MED ORDER — SODIUM CHLORIDE 0.9 % IV SOLN
2.0000 g | Freq: Every day | INTRAVENOUS | Status: DC
  Administered 2023-05-13 – 2023-05-14 (×2): 2 g via INTRAVENOUS
  Filled 2023-05-13 (×2): qty 12.5

## 2023-05-13 MED ORDER — SUCRALFATE 1 G PO TABS
1.0000 g | ORAL_TABLET | Freq: Four times a day (QID) | ORAL | Status: DC
  Administered 2023-05-13 – 2023-05-17 (×14): 1 g via ORAL
  Filled 2023-05-13 (×16): qty 1

## 2023-05-13 MED ORDER — AMLODIPINE BESYLATE 10 MG PO TABS
10.0000 mg | ORAL_TABLET | Freq: Every day | ORAL | Status: DC
  Administered 2023-05-15 – 2023-05-17 (×2): 10 mg via ORAL
  Filled 2023-05-13 (×3): qty 1

## 2023-05-13 NOTE — H&P (View-Only) (Signed)
[facility] Gastroenterology Consult  Referring Provider: No ref. provider found Primary Care Physician:  [name], DO Primary Gastroenterologist: [facility] - Dr. Matthias Hughs  Reason for Consultation: Fecal occult blood positive, anemia  SUBJECTIVE:   HPI: Jessica Abbott is a 82 y.o. female with past medical history significant for peripheral artery disease on daily low-dose aspirin, diabetes mellitus and chronic kidney disease.  Presented to hospital on 05/13/2023 with shortness of breath, fatigue.  She noted presenting to hospital for similar complaint on 05/01/2023, at that time was diagnosed with urinary tract infection and was discharged on Keflex.  Hemoglobin at that time noted to be 8.5 which is similar to a previous baseline.  Patient noted that she did not have symptom improvement after her emergency department visit.  She had worsening of fatigue and subsequently presented to the emergency department again on 05/13/2023.  She notes that she takes oral iron supplement twice per day, has dark-colored stools.  On presentation, hemoglobin found to be 6.4, WBC 17.9, platelet 436, INR 1.3, fecal occult blood testing positive.  Chest x-ray showed no active disease.  On evaluation today, she noted that she is feeling improved after transfusion and iron therapy.  No abdominal pain.  Did have some nausea and emesis over the past weekend.    EGD 11/23/2015 for iron deficiency anemia (Dr. Matthias Hughs) showed 1 cm hiatal hernia, normal stomach, normal duodenum. Colonoscopy 11/24/2015 for iron deficiency anemia (Dr. Matthias Hughs) showed pandiverticulosis. Video capsule endoscopy 12/02/2015 (Dr. Matthias Hughs) was within normal limits.  Past Medical History:  Diagnosis Date   Anemia    Aortic regurgitation    moderate AR by 11/25/15 echo   Arthritis    Chronic kidney disease    Diabetes mellitus without complication (HCC)    History of blood transfusion    History of bronchitis    History of pneumonia    Hypertension     Peripheral arterial disease (HCC)    Peripheral vascular disease (HCC)    Pulmonary hypertension (HCC)    11/25/15 echo: PA systolic pressure severely increaed, PA peak pressure 66 mm Hg.    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   SOB (shortness of breath)    Stress incontinence    Past Surgical History:  Procedure Laterality Date   ABDOMINAL AORTOGRAM W/LOWER EXTREMITY N/A 10/22/2016   Procedure: Abdominal Aortogram w/Lower Extremity;  Surgeon: Chuck Hint, MD;  Location: [facility] INVASIVE CV LAB;  Service: Cardiovascular;  Laterality: N/A;  lt leg   ABDOMINAL HYSTERECTOMY     AMPUTATION Right 12/23/2015   Procedure: RIGHT  ABOVE KNEE AMPUTATION;  Surgeon: Chuck Hint, MD;  Location: [facility] OR;  Service: Vascular;  Laterality: Right;   BACK SURGERY  2002   COLONOSCOPY N/A 11/24/2015   Procedure: COLONOSCOPY;  Surgeon: Bernette Redbird, MD;  Location: [facility] ENDOSCOPY;  Service: Endoscopy;  Laterality: N/A;   ESOPHAGOGASTRODUODENOSCOPY (EGD) WITH PROPOFOL N/A 11/23/2015   Procedure: ESOPHAGOGASTRODUODENOSCOPY (EGD) WITH PROPOFOL;  Surgeon: Bernette Redbird, MD;  Location: [facility] ENDOSCOPY;  Service: Endoscopy;  Laterality: N/A;   femoral artery arteriogram  04/20/2015   GIVENS CAPSULE STUDY N/A 11/26/2015   Procedure: GIVENS CAPSULE STUDY;  Surgeon: Bernette Redbird, MD;  Location: [facility] ENDOSCOPY;  Service: Endoscopy;  Laterality: N/A;   Prior to Admission medications   Medication Sig Start Date End Date Taking? Authorizing Provider  albuterol (PROVENTIL HFA;VENTOLIN HFA) 108 (90 Base) MCG/ACT inhaler Inhale 2 puffs into the lungs every 6 (six) hours as needed for wheezing or  shortness of breath.   Yes [provider]  amLODipine (NORVASC) 10 MG tablet Take 1 tablet by mouth daily. 06/06/20  Yes [provider]  aspirin EC 81 MG tablet Take 81 mg by mouth daily.    Yes [provider]  atorvastatin (LIPITOR) 10 MG tablet Take 10 mg by mouth daily.  05/28/19  Yes [provider]  Cholecalciferol 1000 units tablet Take 1,000 Units by mouth daily.   Yes [provider]  docusate sodium (COLACE) 100 MG capsule Take 100 mg by mouth daily as needed (constipation).   Yes [provider]  ferrous sulfate 325 (65 FE) MG tablet Take 1 tablet (325 mg total) by mouth 2 (two) times daily with a meal. 01/05/16  Yes Love, Evlyn Kanner, PA-C  hydrochlorothiazide (MICROZIDE) 12.5 MG capsule Take 12.5 mg by mouth daily. 09/29/19  Yes [provider]  lactose free nutrition (BOOST) LIQD Take 237 mLs by mouth daily as needed (occasionally).    Yes [provider]  losartan (COZAAR) 100 MG tablet Take 100 mg by mouth daily. 08/29/20  Yes [provider]  ondansetron (ZOFRAN-ODT) 4 MG disintegrating tablet Take 1 tablet (4 mg total) by mouth every 8 (eight) hours as needed for nausea or vomiting. 05/01/23  Yes Alvira Monday, MD   Current Facility-Administered Medications  Medication Dose Route Frequency Provider Last Rate Last Admin   0.9 %  sodium chloride infusion (Manually program via Guardrails IV Fluids)   Intravenous Once Gery Pray, MD       acetaminophen (TYLENOL) tablet 650 mg  650 mg Oral Q6H PRN Gery Pray, MD   650 mg at 05/13/23 0536   Or   acetaminophen (TYLENOL) suppository 650 mg  650 mg Rectal Q6H PRN Crosley, Debby, MD       amLODipine (NORVASC) tablet 10 mg  10 mg Oral Daily Crosley, Debby, MD       cefTRIAXone (ROCEPHIN) 1 g in sodium chloride 0.9 % 100 mL IVPB  1 g Intravenous Q24H Crosley, Debby, MD       hydrochlorothiazide (HYDRODIURIL) tablet 12.5 mg  12.5 mg Oral Daily Crosley, Debby, MD       losartan (COZAAR) tablet 100 mg  100 mg Oral Daily Crosley, Debby, MD       ondansetron (ZOFRAN) injection 4 mg  4 mg Intravenous Q6H PRN [name], Debby, MD   4 mg at 05/13/23 0536   pantoprazole (PROTONIX) injection 40 mg  40 mg Intravenous Q12H Crosley, Debby, MD   40 mg at 05/13/23 1054    Allergies as of 05/12/2023 - Review Complete 05/12/2023  Allergen Reaction Noted   Gabapentin Other (See Comments) 03/14/2016   Family History  Problem Relation Age of Onset   Heart disease Father    Social History   Socioeconomic History   Marital status: Married    Spouse name: Not on file   Number of children: Not on file   Years of education: Not on file   Highest education level: Not on file  Occupational History   Not on file  Tobacco Use   Smoking status: Former    Current packs/day: 0.00    Types: Cigarettes    Quit date: 10/27/2013    Years since quitting: 9.5   Smokeless tobacco: Never  Vaping Use   Vaping status: Never Used  Substance and Sexual Activity   Alcohol use: No    Alcohol/week: 0.0 standard drinks of alcohol   Drug use: No  Sexual activity: Not on file  Other Topics Concern   Not on file  Social History Narrative   Not on file   Social Determinants of Health   Financial Resource Strain: Not on file  Food Insecurity: No Food Insecurity (05/12/2023)   Hunger Vital Sign    Worried About Running Out of Food in the Last Year: Never true    Ran Out of Food in the Last Year: Never true  Transportation Needs: No Transportation Needs (05/12/2023)   PRAPARE - Administrator, Civil Service (Medical): No    Lack of Transportation (Non-Medical): No  Physical Activity: Not on file  Stress: Not on file  Social Connections: Not on file  Intimate Partner Violence: Not At Risk (05/12/2023)   Humiliation, Afraid, Rape, and Kick questionnaire    Fear of Current or Ex-Partner: No    Emotionally Abused: No    Physically Abused: No    Sexually Abused: No   Review of Systems:  Review of Systems  Constitutional:  Positive for fever.  Respiratory:  Negative for shortness of breath.   Cardiovascular:  Negative for chest pain.  Gastrointestinal:  Positive for nausea and vomiting. Negative for abdominal pain.    OBJECTIVE:   Temp:  [98.1 F  (36.7 C)-100.6 F (38.1 C)] 98.1 F (36.7 C) (08/26 1218) Pulse Rate:  [64-81] 65 (08/26 1218) Resp:  [16-31] 18 (08/26 1218) BP: (133-155)/(37-52) 142/51 (08/26 1218) SpO2:  [92 %-99 %] 98 % (08/26 1218) Weight:  [79.4 kg] 79.4 kg (08/25 1747) Last BM Date : 05/11/23 Physical Exam Constitutional:      General: She is not in acute distress.    Appearance: She is not ill-appearing, toxic-appearing or diaphoretic.  Cardiovascular:     Rate and Rhythm: Normal rate and regular rhythm.  Pulmonary:     Effort: No respiratory distress.     Breath sounds: Normal breath sounds.  Abdominal:     General: Bowel sounds are normal. There is no distension.     Palpations: Abdomen is soft.     Tenderness: There is no abdominal tenderness. There is no guarding.  Neurological:     Mental Status: She is alert.     Labs: Recent Labs    05/12/23 1840 05/13/23 1058  WBC 17.9* 20.7*  HGB 6.4* 7.7*  HCT 20.6* 24.4*  PLT 436* 342   BMET Recent Labs    05/12/23 1840 05/13/23 1058  NA 131* 138  K 4.1 4.1  CL 101 104  CO2 19* 21*  GLUCOSE 135* 117*  BUN 32* 29*  CREATININE 1.80* 1.67*  CALCIUM 8.8* 9.4   LFT Recent Labs    05/13/23 1058  PROT 7.2  ALBUMIN 3.1*  AST 17  ALT 11  ALKPHOS 49  BILITOT 1.0   PT/INR Recent Labs    05/12/23 1840  LABPROT 16.4*  INR 1.3*    Diagnostic imaging: DG Chest Portable 1 View  Result Date: 05/12/2023 CLINICAL DATA:  Shortness of breath. EXAM: PORTABLE CHEST 1 VIEW COMPARISON:  May 01, 2023 FINDINGS: The heart size and mediastinal contours are within normal limits. Both lungs are clear. Multilevel degenerative changes seen throughout the thoracic spine. IMPRESSION: No active disease. Electronically Signed   By: Aram Candela M.D.   On: 05/12/2023 18:53    IMPRESSION: Microcytic anemia, iron deficiency Leukocytosis, cystitis History pan-colonic diverticulosis History hiatal hernia History diabetes mellitus History chronic  kidney disease  PLAN: -Recommend EGD to evaluate further, discussed benefits, alternatives  and risks of procedure with patient including bleeding/infection/perforation/missed lesion/anesthesia, she verbalized understanding and elected to proceed -IV PPI Q12Hr -Trend H/H, transfuse for Hgb < 7 -NPO at midnight 05/14/23, OK for regular diet this evening -Ok for low dose aspirin -Further recommendations to follow pending procedure   LOS: 1 day   Liliane Shi, DO [facility] Gastroenterology

## 2023-05-13 NOTE — Plan of Care (Signed)

## 2023-05-13 NOTE — Progress Notes (Signed)
PROGRESS NOTE    Jessica Abbott  WNU:[id number] DOB: 1941-01-23 DOA: 05/12/2023 PCP: [name], DO  Outpatient Specialists:     Brief Narrative:  Patient is an 82 year old female with PMHx of IDA, HTN, T2DM, CKD stage IIIb, moderate COPD, cutaneous lupus, PAD sp R AKA, PVD, wheelchair-bound.  Patient was admitted with symptomatic anemia.  On presentation, hemoglobin was 6.4 g/dL.  Patient has history of iron deficiency anemia.  Last colonoscopy was 1 to 2 years ago.  Solid versus bedside to have been cauterized.  Capsule endoscopy done about the same time was nonrevealing.  Patient is known to [facility].  No history of NSAIDs.  GI team has been consulted.  05/13/2023: Patient seen alongside patient's daughter.  GI team has been consulted as well.  Patient has been transfused 2 units of packed red blood cells.  Patient has also gotten IV iron.  Awaiting GI input.  Further management will depend on hospital course.   Assessment & Plan:   Principal Problem:   Symptomatic anemia Active Problems:   Acute GI bleeding   Acute renal failure (HCC)   PVD (peripheral vascular disease) (HCC)   Controlled diabetes mellitus type 2 with complications (HCC)   Benign essential HTN   Thrombocytosis   PAD (peripheral artery disease) (HCC)   Cutaneous lupus erythematosus   COPD (chronic obstructive pulmonary disease) (HCC)   Symptomatic anemia // Iron deficiency anemia //  Acute GI bleeding: -IV Protonix every 12 -H&H Q8.  Posttransfusion H&H -Transfuse 2 units packed red blood cells -N.p.o. -GI consult per a.m. team.  Patient with known history of IDA.  In 2017, patient's hemoglobin 3.3.  Followed with GI outpatient -IV iron ordered -Baby aspirin on hold 05/13/2023: Patient has been transfused packed red blood cells, as well as, IV iron.  Hemoglobin is 7.7 g/dL.  Awaiting GI input.  Continue IV Protonix.  Sucralfate.    Acute cystitis -Urine cultures collected, but pending -Continue IV  Rocephin    CKD stage 3B -Stable at baseline    Benign essential HTN -Continue Norvasc, Cozaar resumed -Atenolol 50 mg daily, HCTZ 12.5 mg daily to be resumed based on titration of BP    PAD  PVD (peripheral vascular disease) (HCC)  Thrombocytosis   DVT prophylaxis: SCD. Code Status: Full code. Family Communication: Daughter was at the bedside. Disposition Plan: Likely home eventually.   Consultants:  GI.  Procedures:  For now.  Antimicrobials:  IV Rocephin   Subjective: -No new symptoms. -No shortness of breath. -No chest pain  Objective: Vitals:   05/13/23 0544 05/13/23 0545 05/13/23 0745 05/13/23 1218  BP:  (!) 146/46 (!) 134/38 (!) 142/51  Pulse:  68 64 65  Resp:  (!) 26  18  Temp: 98.4 F (36.9 C) 98.4 F (36.9 C) 98.1 F (36.7 C) 98.1 F (36.7 C)  TempSrc: Oral Oral Oral Oral  SpO2:  98% 98% 98%  Weight:      Height:        Intake/Output Summary (Last 24 hours) at 05/13/2023 1730 Last data filed at 05/13/2023 0740 Gross per 24 hour  Intake 1697.71 ml  Output 500 ml  Net 1197.71 ml   Filed Weights   05/12/23 1747  Weight: 79.4 kg    Examination:  General exam: Appears calm and comfortable.  Patient is pale.  Patient is status post right above-knee amputation Respiratory system: Clear to auscultation.  Cardiovascular system: S1 & S2 heard. Gastrointestinal system: Abdomen is obese, soft and nontender.  Central nervous system: Alert and oriented.  Patient moves all extremities. Extremities: Status post right AKA.  Mild edema of left lower extremity.  Data Reviewed: I have personally reviewed following labs and imaging studies  CBC: Recent Labs  Lab 05/12/23 1840 05/13/23 1058  WBC 17.9* 20.7*  NEUTROABS 15.6* 18.2*  HGB 6.4* 7.7*  HCT 20.6* 24.4*  MCV 72.8* 79.0*  PLT 436* 342   Basic Metabolic Panel: Recent Labs  Lab 05/12/23 1840 05/13/23 1058  NA 131* 138  K 4.1 4.1  CL 101 104  CO2 19* 21*  GLUCOSE 135* 117*  BUN  32* 29*  CREATININE 1.80* 1.67*  CALCIUM 8.8* 9.4  MG  --  2.0  PHOS  --  4.8*   GFR: Estimated Creatinine Clearance: 25.8 mL/min (A) (by C-G formula based on SCr of 1.67 mg/dL (H)). Liver Function Tests: Recent Labs  Lab 05/12/23 1840 05/13/23 1058  AST 20 17  ALT 12 11  ALKPHOS 54 49  BILITOT 0.6 1.0  PROT 7.9 7.2  ALBUMIN 3.3* 3.1*   No results for input(s): "LIPASE", "AMYLASE" in the last 168 hours. No results for input(s): "AMMONIA" in the last 168 hours. Coagulation Profile: Recent Labs  Lab 05/12/23 1840  INR 1.3*   Cardiac Enzymes: No results for input(s): "CKTOTAL", "CKMB", "CKMBINDEX", "TROPONINI" in the last 168 hours. BNP (last 3 results) No results for input(s): "PROBNP" in the last 8760 hours. HbA1C: No results for input(s): "HGBA1C" in the last 72 hours. CBG: No results for input(s): "GLUCAP" in the last 168 hours. Lipid Profile: No results for input(s): "CHOL", "HDL", "LDLCALC", "TRIG", "CHOLHDL", "LDLDIRECT" in the last 72 hours. Thyroid Function Tests: No results for input(s): "TSH", "T4TOTAL", "FREET4", "T3FREE", "THYROIDAB" in the last 72 hours. Anemia Panel: Recent Labs    05/12/23 1934  VITAMINB12 684  FOLATE 29.0  FERRITIN 346*  TIBC 231*  IRON 25*  RETICCTPCT 5.0*   Urine analysis:    Component Value Date/Time   COLORURINE YELLOW 05/12/2023 1824   APPEARANCEUR CLOUDY (A) 05/12/2023 1824   LABSPEC 1.015 05/12/2023 1824   PHURINE 5.5 05/12/2023 1824   GLUCOSEU NEGATIVE 05/12/2023 1824   HGBUR SMALL (A) 05/12/2023 1824   BILIRUBINUR NEGATIVE 05/12/2023 1824   KETONESUR NEGATIVE 05/12/2023 1824   PROTEINUR 100 (A) 05/12/2023 1824   NITRITE NEGATIVE 05/12/2023 1824   LEUKOCYTESUR LARGE (A) 05/12/2023 1824   Sepsis Labs: @LABRCNTIP (procalcitonin:4,lacticidven:4)  ) Recent Results (from the past 240 hour(s))  SARS Coronavirus 2 by RT PCR (hospital order, performed in [facility] hospital lab) *cepheid single result test* Anterior  Nasal Swab     Status: None   Collection Time: 05/12/23  5:52 PM   Specimen: Anterior Nasal Swab  Result Value Ref Range Status   SARS Coronavirus 2 by RT PCR NEGATIVE NEGATIVE Final    Comment: (NOTE) SARS-CoV-2 target nucleic acids are NOT DETECTED.  The SARS-CoV-2 RNA is generally detectable in upper and lower respiratory specimens during the acute phase of infection. The lowest concentration of SARS-CoV-2 viral copies this assay can detect is 250 copies / mL. A negative result does not preclude SARS-CoV-2 infection and should not be used as the sole basis for treatment or other patient management decisions.  A negative result may occur with improper specimen collection / handling, submission of specimen other than nasopharyngeal swab, presence of viral mutation(s) within the areas targeted by this assay, and inadequate number of viral copies (<250 copies / mL). A negative result must be combined  with clinical observations, patient history, and epidemiological information.  Fact Sheet for Patients:   RoadLapTop.co.za  Fact Sheet for Healthcare Providers: http://kim-miller.com/  This test is not yet approved or  cleared by the [place] FDA and has been authorized for detection and/or diagnosis of SARS-CoV-2 by FDA under an Emergency Use Authorization (EUA).  This EUA will remain in effect (meaning this test can be used) for the duration of the COVID-19 declaration under Section 564(b)(1) of the Act, 21 U.S.C. section 360bbb-3(b)(1), unless the authorization is terminated or revoked sooner.  Performed at [facility], [address] [place], [place] [address]   Blood Culture (routine x 2)     Status: None (Preliminary result)   Collection Time: 05/12/23  6:40 PM   Specimen: BLOOD  Result Value Ref Range Status   Specimen Description   Final    BLOOD RIGHT ANTECUBITAL Performed at [facility], [address], [place], [place] [address]    Special Requests   Final    BOTTLES DRAWN AEROBIC AND ANAEROBIC Blood Culture adequate volume Performed at [facility], [address] [place], [place] [address]    Culture   Final    NO GROWTH < 24 HOURS Performed at [facility], [address] [place], [place] [address]    Report Status PENDING  Incomplete  Blood Culture (routine x 2)     Status: None (Preliminary result)   Collection Time: 05/12/23  6:52 PM   Specimen: BLOOD  Result Value Ref Range Status   Specimen Description   Final    BLOOD LEFT ANTECUBITAL Performed at [facility], [address] [place], [place] [address]    Special Requests   Final    BOTTLES DRAWN AEROBIC AND ANAEROBIC Blood Culture adequate volume Performed at [facility], [address] [place], [place] [address]    Culture   Final    NO GROWTH < 24 HOURS Performed at [facility], [address] [place], [place] [address]    Report Status PENDING  Incomplete         Radiology Studies: DG Chest Portable 1 View  Result Date: 05/12/2023 CLINICAL DATA:  Shortness of breath. EXAM: PORTABLE CHEST 1 VIEW COMPARISON:  May 01, 2023 FINDINGS: The heart size and mediastinal contours are within normal limits. Both lungs are clear. Multilevel degenerative changes seen throughout the thoracic spine. IMPRESSION: No active disease. Electronically Signed   By: Aram Candela M.D.   On: 05/12/2023 18:53        Scheduled Meds:  sodium chloride   Intravenous Once   amLODipine  10 mg Oral Daily   hydrochlorothiazide  12.5 mg Oral Daily   losartan  100 mg Oral Daily   pantoprazole (PROTONIX) IV  40 mg Intravenous Q12H   sucralfate  1 g Oral Q6H   Continuous Infusions:  cefTRIAXone (ROCEPHIN)  IV       LOS: 1 day    Time spent: 35 minutes.    Berton Mount, MD  [facility] Pager #: [phone] 7PM-7AM contact night coverage as above

## 2023-05-13 NOTE — Progress Notes (Addendum)
PHARMACY - PHYSICIAN COMMUNICATION CRITICAL VALUE ALERT - BLOOD CULTURE IDENTIFICATION (BCID)  Jessica Abbott is an 82 y.o. female who presented to [facility] on 05/12/2023 with a chief complaint of anemia & possible GI bleed. Also reported fever/chills the morning of admission; looks to have been prescribed a 10-day course of Keflex on 8/15.  Assessment:  1/4 BCx bottles growing pseudomonas (no resistance genes detected; suspect urinary source)  Name of physician (or Provider) contacted: Johann Capers, NP  Current antibiotics: Rocephin  Changes to prescribed antibiotics recommended: Broaden to cefepime 2g IV q24 hr Recommendations accepted by provider  Results for orders placed or performed during the hospital encounter of 05/12/23  Blood Culture ID Panel (Reflexed) (Collected: 05/12/2023  6:40 PM)  Result Value Ref Range   Enterococcus faecalis NOT DETECTED NOT DETECTED   Enterococcus Faecium NOT DETECTED NOT DETECTED   Listeria monocytogenes NOT DETECTED NOT DETECTED   Staphylococcus species NOT DETECTED NOT DETECTED   Staphylococcus aureus (BCID) NOT DETECTED NOT DETECTED   Staphylococcus epidermidis NOT DETECTED NOT DETECTED   Staphylococcus lugdunensis NOT DETECTED NOT DETECTED   Streptococcus species NOT DETECTED NOT DETECTED   Streptococcus agalactiae NOT DETECTED NOT DETECTED   Streptococcus pneumoniae NOT DETECTED NOT DETECTED   Streptococcus pyogenes NOT DETECTED NOT DETECTED   A.calcoaceticus-baumannii NOT DETECTED NOT DETECTED   Bacteroides fragilis NOT DETECTED NOT DETECTED   Enterobacterales NOT DETECTED NOT DETECTED   Enterobacter cloacae complex NOT DETECTED NOT DETECTED   Escherichia coli NOT DETECTED NOT DETECTED   Klebsiella aerogenes NOT DETECTED NOT DETECTED   Klebsiella oxytoca NOT DETECTED NOT DETECTED   Klebsiella pneumoniae NOT DETECTED NOT DETECTED   Proteus species NOT DETECTED NOT DETECTED   Salmonella species NOT DETECTED NOT DETECTED   Serratia  marcescens NOT DETECTED NOT DETECTED   Haemophilus influenzae NOT DETECTED NOT DETECTED   Neisseria meningitidis NOT DETECTED NOT DETECTED   Pseudomonas aeruginosa DETECTED (A) NOT DETECTED   Stenotrophomonas maltophilia NOT DETECTED NOT DETECTED   Candida albicans NOT DETECTED NOT DETECTED   Candida auris NOT DETECTED NOT DETECTED   Candida glabrata NOT DETECTED NOT DETECTED   Candida krusei NOT DETECTED NOT DETECTED   Candida parapsilosis NOT DETECTED NOT DETECTED   Candida tropicalis NOT DETECTED NOT DETECTED   Cryptococcus neoformans/gattii NOT DETECTED NOT DETECTED   CTX-M ESBL NOT DETECTED NOT DETECTED   Carbapenem resistance IMP NOT DETECTED NOT DETECTED   Carbapenem resistance KPC NOT DETECTED NOT DETECTED   Carbapenem resistance NDM NOT DETECTED NOT DETECTED   Carbapenem resistance VIM NOT DETECTED NOT DETECTED    [name] A 05/13/2023  7:46 PM

## 2023-05-13 NOTE — Plan of Care (Signed)

## 2023-05-13 NOTE — Consult Note (Signed)
[facility] Gastroenterology Consult  Referring Provider: No ref. provider found Primary Care Physician:  [name], DO Primary Gastroenterologist: [facility] - Dr. Matthias Hughs  Reason for Consultation: Fecal occult blood positive, anemia  SUBJECTIVE:   HPI: Jessica Abbott is a 82 y.o. female with past medical history significant for peripheral artery disease on daily low-dose aspirin, diabetes mellitus and chronic kidney disease.  Presented to hospital on 05/13/2023 with shortness of breath, fatigue.  She noted presenting to hospital for similar complaint on 05/01/2023, at that time was diagnosed with urinary tract infection and was discharged on Keflex.  Hemoglobin at that time noted to be 8.5 which is similar to a previous baseline.  Patient noted that she did not have symptom improvement after her emergency department visit.  She had worsening of fatigue and subsequently presented to the emergency department again on 05/13/2023.  She notes that she takes oral iron supplement twice per day, has dark-colored stools.  On presentation, hemoglobin found to be 6.4, WBC 17.9, platelet 436, INR 1.3, fecal occult blood testing positive.  Chest x-ray showed no active disease.  On evaluation today, she noted that she is feeling improved after transfusion and iron therapy.  No abdominal pain.  Did have some nausea and emesis over the past weekend.    EGD 11/23/2015 for iron deficiency anemia (Dr. Matthias Hughs) showed 1 cm hiatal hernia, normal stomach, normal duodenum. Colonoscopy 11/24/2015 for iron deficiency anemia (Dr. Matthias Hughs) showed pandiverticulosis. Video capsule endoscopy 12/02/2015 (Dr. Matthias Hughs) was within normal limits.  Past Medical History:  Diagnosis Date   Anemia    Aortic regurgitation    moderate AR by 11/25/15 echo   Arthritis    Chronic kidney disease    Diabetes mellitus without complication (HCC)    History of blood transfusion    History of bronchitis    History of pneumonia    Hypertension     Peripheral arterial disease (HCC)    Peripheral vascular disease (HCC)    Pulmonary hypertension (HCC)    11/25/15 echo: PA systolic pressure severely increaed, PA peak pressure 66 mm Hg.    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   SOB (shortness of breath)    Stress incontinence    Past Surgical History:  Procedure Laterality Date   ABDOMINAL AORTOGRAM W/LOWER EXTREMITY N/A 10/22/2016   Procedure: Abdominal Aortogram w/Lower Extremity;  Surgeon: Chuck Hint, MD;  Location: [facility] INVASIVE CV LAB;  Service: Cardiovascular;  Laterality: N/A;  lt leg   ABDOMINAL HYSTERECTOMY     AMPUTATION Right 12/23/2015   Procedure: RIGHT  ABOVE KNEE AMPUTATION;  Surgeon: Chuck Hint, MD;  Location: [facility] OR;  Service: Vascular;  Laterality: Right;   BACK SURGERY  2002   COLONOSCOPY N/A 11/24/2015   Procedure: COLONOSCOPY;  Surgeon: Bernette Redbird, MD;  Location: [facility] ENDOSCOPY;  Service: Endoscopy;  Laterality: N/A;   ESOPHAGOGASTRODUODENOSCOPY (EGD) WITH PROPOFOL N/A 11/23/2015   Procedure: ESOPHAGOGASTRODUODENOSCOPY (EGD) WITH PROPOFOL;  Surgeon: Bernette Redbird, MD;  Location: [facility] ENDOSCOPY;  Service: Endoscopy;  Laterality: N/A;   femoral artery arteriogram  04/20/2015   GIVENS CAPSULE STUDY N/A 11/26/2015   Procedure: GIVENS CAPSULE STUDY;  Surgeon: Bernette Redbird, MD;  Location: [facility] ENDOSCOPY;  Service: Endoscopy;  Laterality: N/A;   Prior to Admission medications   Medication Sig Start Date End Date Taking? Authorizing Provider  albuterol (PROVENTIL HFA;VENTOLIN HFA) 108 (90 Base) MCG/ACT inhaler Inhale 2 puffs into the lungs every 6 (six) hours as needed for wheezing or  shortness of breath.   Yes [provider]  amLODipine (NORVASC) 10 MG tablet Take 1 tablet by mouth daily. 06/06/20  Yes [provider]  aspirin EC 81 MG tablet Take 81 mg by mouth daily.    Yes [provider]  atorvastatin (LIPITOR) 10 MG tablet Take 10 mg by mouth daily.  05/28/19  Yes [provider]  Cholecalciferol 1000 units tablet Take 1,000 Units by mouth daily.   Yes [provider]  docusate sodium (COLACE) 100 MG capsule Take 100 mg by mouth daily as needed (constipation).   Yes [provider]  ferrous sulfate 325 (65 FE) MG tablet Take 1 tablet (325 mg total) by mouth 2 (two) times daily with a meal. 01/05/16  Yes Love, Evlyn Kanner, PA-C  hydrochlorothiazide (MICROZIDE) 12.5 MG capsule Take 12.5 mg by mouth daily. 09/29/19  Yes [provider]  lactose free nutrition (BOOST) LIQD Take 237 mLs by mouth daily as needed (occasionally).    Yes [provider]  losartan (COZAAR) 100 MG tablet Take 100 mg by mouth daily. 08/29/20  Yes [provider]  ondansetron (ZOFRAN-ODT) 4 MG disintegrating tablet Take 1 tablet (4 mg total) by mouth every 8 (eight) hours as needed for nausea or vomiting. 05/01/23  Yes Alvira Monday, MD   Current Facility-Administered Medications  Medication Dose Route Frequency Provider Last Rate Last Admin   0.9 %  sodium chloride infusion (Manually program via Guardrails IV Fluids)   Intravenous Once Gery Pray, MD       acetaminophen (TYLENOL) tablet 650 mg  650 mg Oral Q6H PRN Gery Pray, MD   650 mg at 05/13/23 0536   Or   acetaminophen (TYLENOL) suppository 650 mg  650 mg Rectal Q6H PRN Crosley, Debby, MD       amLODipine (NORVASC) tablet 10 mg  10 mg Oral Daily Crosley, Debby, MD       cefTRIAXone (ROCEPHIN) 1 g in sodium chloride 0.9 % 100 mL IVPB  1 g Intravenous Q24H Crosley, Debby, MD       hydrochlorothiazide (HYDRODIURIL) tablet 12.5 mg  12.5 mg Oral Daily Crosley, Debby, MD       losartan (COZAAR) tablet 100 mg  100 mg Oral Daily Crosley, Debby, MD       ondansetron (ZOFRAN) injection 4 mg  4 mg Intravenous Q6H PRN [name], Debby, MD   4 mg at 05/13/23 0536   pantoprazole (PROTONIX) injection 40 mg  40 mg Intravenous Q12H Crosley, Debby, MD   40 mg at 05/13/23 1054    Allergies as of 05/12/2023 - Review Complete 05/12/2023  Allergen Reaction Noted   Gabapentin Other (See Comments) 03/14/2016   Family History  Problem Relation Age of Onset   Heart disease Father    Social History   Socioeconomic History   Marital status: Married    Spouse name: Not on file   Number of children: Not on file   Years of education: Not on file   Highest education level: Not on file  Occupational History   Not on file  Tobacco Use   Smoking status: Former    Current packs/day: 0.00    Types: Cigarettes    Quit date: 10/27/2013    Years since quitting: 9.5   Smokeless tobacco: Never  Vaping Use   Vaping status: Never Used  Substance and Sexual Activity   Alcohol use: No    Alcohol/week: 0.0 standard drinks of alcohol   Drug use: No  Sexual activity: Not on file  Other Topics Concern   Not on file  Social History Narrative   Not on file   Social Determinants of Health   Financial Resource Strain: Not on file  Food Insecurity: No Food Insecurity (05/12/2023)   Hunger Vital Sign    Worried About Running Out of Food in the Last Year: Never true    Ran Out of Food in the Last Year: Never true  Transportation Needs: No Transportation Needs (05/12/2023)   PRAPARE - Administrator, Civil Service (Medical): No    Lack of Transportation (Non-Medical): No  Physical Activity: Not on file  Stress: Not on file  Social Connections: Not on file  Intimate Partner Violence: Not At Risk (05/12/2023)   Humiliation, Afraid, Rape, and Kick questionnaire    Fear of Current or Ex-Partner: No    Emotionally Abused: No    Physically Abused: No    Sexually Abused: No   Review of Systems:  Review of Systems  Constitutional:  Positive for fever.  Respiratory:  Negative for shortness of breath.   Cardiovascular:  Negative for chest pain.  Gastrointestinal:  Positive for nausea and vomiting. Negative for abdominal pain.    OBJECTIVE:   Temp:  [98.1 F  (36.7 C)-100.6 F (38.1 C)] 98.1 F (36.7 C) (08/26 1218) Pulse Rate:  [64-81] 65 (08/26 1218) Resp:  [16-31] 18 (08/26 1218) BP: (133-155)/(37-52) 142/51 (08/26 1218) SpO2:  [92 %-99 %] 98 % (08/26 1218) Weight:  [79.4 kg] 79.4 kg (08/25 1747) Last BM Date : 05/11/23 Physical Exam Constitutional:      General: She is not in acute distress.    Appearance: She is not ill-appearing, toxic-appearing or diaphoretic.  Cardiovascular:     Rate and Rhythm: Normal rate and regular rhythm.  Pulmonary:     Effort: No respiratory distress.     Breath sounds: Normal breath sounds.  Abdominal:     General: Bowel sounds are normal. There is no distension.     Palpations: Abdomen is soft.     Tenderness: There is no abdominal tenderness. There is no guarding.  Neurological:     Mental Status: She is alert.     Labs: Recent Labs    05/12/23 1840 05/13/23 1058  WBC 17.9* 20.7*  HGB 6.4* 7.7*  HCT 20.6* 24.4*  PLT 436* 342   BMET Recent Labs    05/12/23 1840 05/13/23 1058  NA 131* 138  K 4.1 4.1  CL 101 104  CO2 19* 21*  GLUCOSE 135* 117*  BUN 32* 29*  CREATININE 1.80* 1.67*  CALCIUM 8.8* 9.4   LFT Recent Labs    05/13/23 1058  PROT 7.2  ALBUMIN 3.1*  AST 17  ALT 11  ALKPHOS 49  BILITOT 1.0   PT/INR Recent Labs    05/12/23 1840  LABPROT 16.4*  INR 1.3*    Diagnostic imaging: DG Chest Portable 1 View  Result Date: 05/12/2023 CLINICAL DATA:  Shortness of breath. EXAM: PORTABLE CHEST 1 VIEW COMPARISON:  May 01, 2023 FINDINGS: The heart size and mediastinal contours are within normal limits. Both lungs are clear. Multilevel degenerative changes seen throughout the thoracic spine. IMPRESSION: No active disease. Electronically Signed   By: Aram Candela M.D.   On: 05/12/2023 18:53    IMPRESSION: Microcytic anemia, iron deficiency Leukocytosis, cystitis History pan-colonic diverticulosis History hiatal hernia History diabetes mellitus History chronic  kidney disease  PLAN: -Recommend EGD to evaluate further, discussed benefits, alternatives  and risks of procedure with patient including bleeding/infection/perforation/missed lesion/anesthesia, she verbalized understanding and elected to proceed -IV PPI Q12Hr -Trend H/H, transfuse for Hgb < 7 -NPO at midnight 05/14/23, OK for regular diet this evening -Ok for low dose aspirin -Further recommendations to follow pending procedure   LOS: 1 day   Liliane Shi, DO [facility] Gastroenterology

## 2023-05-14 ENCOUNTER — Encounter (HOSPITAL_COMMUNITY)
Admission: EM | Disposition: A | Discharge status: Home Health | Payer: Self-pay | Source: Home / Self Care | Attending: Internal Medicine

## 2023-05-14 ENCOUNTER — Inpatient Hospital Stay (HOSPITAL_COMMUNITY): Payer: Medicare Other | Admitting: Certified Registered Nurse Anesthetist

## 2023-05-14 ENCOUNTER — Encounter (HOSPITAL_COMMUNITY): Payer: Self-pay | Admitting: Family Medicine

## 2023-05-14 DIAGNOSIS — K2970 Gastritis, unspecified, without bleeding: Secondary | ICD-10-CM

## 2023-05-14 DIAGNOSIS — J449 Chronic obstructive pulmonary disease, unspecified: Secondary | ICD-10-CM

## 2023-05-14 DIAGNOSIS — D649 Anemia, unspecified: Secondary | ICD-10-CM | POA: Diagnosis not present

## 2023-05-14 DIAGNOSIS — Z87891 Personal history of nicotine dependence: Secondary | ICD-10-CM | POA: Diagnosis not present

## 2023-05-14 DIAGNOSIS — I10 Essential (primary) hypertension: Secondary | ICD-10-CM | POA: Diagnosis not present

## 2023-05-14 HISTORY — PX: ESOPHAGOGASTRODUODENOSCOPY: SHX5428

## 2023-05-14 HISTORY — PX: BIOPSY: SHX5522

## 2023-05-14 SURGERY — EGD (ESOPHAGOGASTRODUODENOSCOPY)
Anesthesia: Monitor Anesthesia Care

## 2023-05-14 MED ORDER — PROPOFOL 500 MG/50ML IV EMUL
INTRAVENOUS | Status: DC | PRN
  Administered 2023-05-14: 100 ug/kg/min via INTRAVENOUS

## 2023-05-14 MED ORDER — SODIUM CHLORIDE 0.9 % IV SOLN: INTRAVENOUS | Status: DC

## 2023-05-14 MED ORDER — PROPOFOL 10 MG/ML IV BOLUS
INTRAVENOUS | Status: DC | PRN
  Administered 2023-05-14: 40 mg via INTRAVENOUS

## 2023-05-14 MED ORDER — LACTATED RINGERS IV SOLN: INTRAVENOUS | Status: DC

## 2023-05-14 NOTE — Progress Notes (Signed)
   05/14/23 1446  TOC Brief Assessment  Insurance and Status Reviewed  Patient has primary care physician Yes  Home environment has been reviewed Home  Prior level of function: wheelchair bound  Prior/Current Home Services No current home services  Social Determinants of Health Reivew SDOH reviewed no interventions necessary  Readmission risk has been reviewed Yes  Transition of care needs no transition of care needs at this time

## 2023-05-14 NOTE — Interval H&P Note (Signed)
History and Physical Interval Note:  05/14/2023 1:11 PM  Jessica Abbott  has presented today for surgery, with the diagnosis of Anemia, fecal occult positive stool.  The various methods of treatment have been discussed with the patient and family. After consideration of risks, benefits and other options for treatment, the patient has consented to  Procedure(s): ESOPHAGOGASTRODUODENOSCOPY (EGD) (N/A) as a surgical intervention.  [name] history has been reviewed, patient examined, no change in status, stable for surgery.  I have reviewed the patient's chart and labs.  Questions were answered to the patient's satisfaction.     Lynann Bologna

## 2023-05-14 NOTE — Transfer of Care (Signed)
Immediate Anesthesia Transfer of Care Note  Patient: Shiya Kesselring  Procedure(s) Performed: Procedure(s): ESOPHAGOGASTRODUODENOSCOPY (EGD) (N/A) BIOPSY  Patient Location: PACU  Anesthesia Type:MAC  Level of Consciousness: Patient easily awoken, sedated, comfortable, cooperative, following commands, responds to stimulation.   Airway & Oxygen Therapy: Patient spontaneously breathing, ventilating well, oxygen via simple oxygen mask.  Post-op Assessment: Report given to PACU RN, vital signs reviewed and stable, moving all extremities.   Post vital signs: Reviewed and stable.  Complications: No apparent anesthesia complications Last Vitals:  Vitals Value Taken Time  BP 136/33 05/14/23 1328  Temp    Pulse 57 05/14/23 1329  Resp 17 05/14/23 1329  SpO2 100 % 05/14/23 1329  Vitals shown include unfiled device data.  Last Pain:  Vitals:   05/14/23 1202  TempSrc: Temporal  PainSc: 0-No pain         Complications: No notable events documented.

## 2023-05-14 NOTE — Anesthesia Procedure Notes (Signed)
Procedure Name: MAC Date/Time: 05/14/2023 1:13 PM  Performed by: Ludwig Lean, CRNAPre-anesthesia Checklist: Patient identified, Emergency Drugs available, Suction available and Patient being monitored Patient Re-evaluated:Patient Re-evaluated prior to induction Oxygen Delivery Method: Simple face mask Placement Confirmation: positive ETCO2 and breath sounds checked- equal and bilateral

## 2023-05-14 NOTE — Brief Op Note (Signed)
05/14/2023  1:44 PM  PATIENT:  Rance Muir  82 y.o. female  PRE-OPERATIVE DIAGNOSIS:  Anemia, fecal occult positive stool  POST-OPERATIVE DIAGNOSIS:  gastric biopsies r/o hpylori, gastritis  PROCEDURE:  Procedure(s): ESOPHAGOGASTRODUODENOSCOPY (EGD) (N/A) BIOPSY  SURGEON:  Surgeons and Role:    Lynann Bologna, DO - Primary  Recommendations:  -Recommend colonoscopy to investigate anemia further, discussed this with patient who deferred this decision to her daughter, Jasmine December would prefer inpatient workup -No availability in endoscopy for colonoscopy 05/15/23 -Plan for colonoscopy 05/16/23 -Ok for diet today, clear liquid diet tomorrow with bowel preparation tomorrow -Informed IM team  -[facility] will follow  Liliane Shi, DO [facility] Gastroenterology

## 2023-05-14 NOTE — Anesthesia Postprocedure Evaluation (Signed)
Anesthesia Post Note  Patient: Jessica Abbott  Procedure(s) Performed: ESOPHAGOGASTRODUODENOSCOPY (EGD) BIOPSY     Patient location during evaluation: Endoscopy Anesthesia Type: MAC Level of consciousness: awake and alert, patient cooperative and oriented Pain management: pain level controlled Vital Signs Assessment: post-procedure vital signs reviewed and stable Respiratory status: nonlabored ventilation, spontaneous breathing and respiratory function stable Cardiovascular status: blood pressure returned to baseline and stable Postop Assessment: no apparent nausea or vomiting Anesthetic complications: no   No notable events documented.  Last Vitals:  Vitals:   05/14/23 1400 05/14/23 1403  BP: (!) 154/119 (!) 162/33  Pulse: (!) 59 62  Resp: 15 20  Temp:    SpO2: 94% 94%    Last Pain:  Vitals:   05/14/23 1403  TempSrc:   PainSc: 0-No pain                 Georgio Hattabaugh,E. Carrissa Taitano

## 2023-05-14 NOTE — Op Note (Signed)
[facility] Patient Name: Jessica Abbott Procedure Date: 05/14/2023 MRN: [id number] Attending MD: Liliane Shi DO, DO, [id number] Date of Birth: 01/17/1941 CSN: [id number] Age: 82 Admit Type: Inpatient Procedure:                Upper GI endoscopy Indications:              Suspected upper gastrointestinal bleeding in                            patient with unexplained iron deficiency anemia Providers:                Liliane Shi DO, DO, Lorenza Evangelist, RN,                            Jacquelyn "Jaci" Clelia Croft, RN, Marja Kays,                            Technician Referring MD:              Medicines:                See the Anesthesia note for documentation of the                            administered medications Complications:            No immediate complications. Estimated Blood Loss:     Estimated blood loss was minimal. Procedure:                Pre-Anesthesia Assessment:                           - ASA Grade Assessment: III - A patient with severe                            systemic disease.                           - The risks and benefits of the procedure and the                            sedation options and risks were discussed with the                            patient. All questions were answered and informed                            consent was obtained.                           After obtaining informed consent, the endoscope was                            passed under direct vision. Throughout the                            procedure, the patient's blood pressure, pulse, and  oxygen saturations were monitored continuously. The                            [id number]-[id number] ([id number]) Olympus endoscope was introduced                            through the mouth, and advanced to the second part                            of duodenum. The upper GI endoscopy was                            accomplished without difficulty. The  patient                            tolerated the procedure well. Scope In: Scope Out: Findings:      The examined esophagus was normal.      The Z-line was regular and was found 39 cm from the incisors.      Localized mild inflammation characterized by congestion (edema) was       found in the gastric antrum. Biopsies were taken with a cold forceps for       histology.      The examined duodenum was normal. Impression:               - Normal esophagus.                           - Z-line regular, 39 cm from the incisors.                           - Gastritis. Biopsied.                           - Normal examined duodenum. Moderate Sedation:      See the other procedure note for documentation of moderate sedation with       intraservice time. Recommendation:           - Return patient to hospital ward for ongoing care.                           - Clear liquid diet.                           - Continue present medications.                           - Await pathology results.                           - Perform a colonoscopy tomorrow. Procedure Code(s):        --- Professional ---                           [phone], Esophagogastroduodenoscopy, flexible,  transoral; with biopsy, single or multiple Diagnosis Code(s):        --- Professional ---                           K29.70, Gastritis, unspecified, without bleeding                           D50.9, Iron deficiency anemia, unspecified CPT copyright 2022 American Medical Association. All rights reserved. The codes documented in this report are preliminary and upon coder review may  be revised to meet current compliance requirements. Dr Liliane Shi, DO Liliane Shi DO, DO 05/14/2023 1:32:34 PM Number of Addenda: 0

## 2023-05-14 NOTE — Plan of Care (Signed)
  Problem: Clinical Measurements: Goal: Will remain free from infection Outcome: Progressing   Problem: Activity: Goal: Risk for activity intolerance will decrease Outcome: Progressing   Problem: Pain Managment: Goal: General experience of comfort will improve Outcome: Progressing   

## 2023-05-14 NOTE — Anesthesia Preprocedure Evaluation (Addendum)
Anesthesia Evaluation  Patient identified by MRN, date of birth, ID band Patient awake    Reviewed: Allergy & Precautions, NPO status , Patient's Chart, lab work & pertinent test results  History of Anesthesia Complications Negative for: history of anesthetic complications  Airway Mallampati: I  TM Distance: >3 FB Neck ROM: Full    Dental  (+) Edentulous Upper, Edentulous Lower   Pulmonary COPD,  COPD inhaler, former smoker   breath sounds clear to auscultation       Cardiovascular hypertension, Pt. on medications (-) angina + Peripheral Vascular Disease (s/p AKA)   Rhythm:Regular Rate:Normal  '17 ECHO: - Left ventricle: The cavity size was normal. Systolic function was normal. EF 60-65%. Wall motion was normal; no regional wall motion abnormalities. grade 1 diastolic dysfunction.  - Aortic valve: There was moderate regurgitation.  - Aortic root: The aortic root was normal in size.  - Mitral valve: Structurally normal valve. There was mild regurgitation.  - Left atrium: The atrium was normal in size.  - Right ventricle: The cavity size was normal. Wall thickness was normal. Systolic function was normal.  - Tricuspid valve: There was moderate regurgitation.  - Pulmonary arteries: The main pulmonary artery was normal-sized. Systolic pressure was severely increased. PA peak pressure: 66 mm Hg (S).     Neuro/Psych negative neurological ROS     GI/Hepatic negative GI ROS, Neg liver ROS,,,  Endo/Other  diabetes (diet controlled)  H/o lupus BMI 32  Renal/GU Renal InsufficiencyRenal disease     Musculoskeletal  (+) Arthritis ,    Abdominal   Peds  Hematology  (+) Blood dyscrasia (Hb 7.7, plt 342k), anemia   Anesthesia Other Findings   Reproductive/Obstetrics                             Anesthesia Physical Anesthesia Plan  ASA: 3  Anesthesia Plan: MAC   Post-op Pain Management: Minimal or  no pain anticipated   Induction:   PONV Risk Score and Plan: 2 and Ondansetron and Treatment may vary due to age or medical condition  Airway Management Planned: Natural Airway and Simple Face Mask  Additional Equipment: None  Intra-op Plan:   Post-operative Plan:   Informed Consent: I have reviewed the patients History and Physical, chart, labs and discussed the procedure including the risks, benefits and alternatives for the proposed anesthesia with the patient or authorized representative who has indicated his/her understanding and acceptance.       Plan Discussed with: CRNA and Surgeon  Anesthesia Plan Comments:         Anesthesia Quick Evaluation

## 2023-05-14 NOTE — Progress Notes (Signed)
PROGRESS NOTE    Jessica Abbott  PPI:[id number] DOB: 01/11/1941 DOA: 05/12/2023 PCP: [name], DO  Outpatient Specialists:     Brief Narrative:  Patient is an 82 year old female with PMHx of IDA, HTN, T2DM, CKD stage IIIb, moderate COPD, cutaneous lupus, PAD sp R AKA, PVD, wheelchair-bound.  Patient was admitted with symptomatic anemia.  On presentation, hemoglobin was 6.4 g/dL.  Patient has history of iron deficiency anemia.  Last colonoscopy was 1 to 2 years ago.  Solid versus bedside to have been cauterized.  Capsule endoscopy done about the same time was nonrevealing.  Patient is known to [facility].  No history of NSAIDs.  GI team has been consulted.  05/13/2023: Patient seen alongside patient's daughter.  GI team has been consulted as well.  Patient has been transfused 2 units of packed red blood cells.  Patient has also gotten IV iron.  Awaiting GI input.  Further management will depend on hospital course.  05/14/2023: Patient seen.  No new complaints.  Patient underwent EGD today.  EGD revealed gastritis that was biopsied.  GI team plans to proceed with colonoscopy on 05/16/2023.  Hemoglobin is 7.7 g/dL today.  Urine and blood cultures are growing Pseudomonas.  Patient is currently on IV cefepime.  Follow final cultures.   Assessment & Plan:   Principal Problem:   Symptomatic anemia Active Problems:   Acute GI bleeding   Acute renal failure (HCC)   PVD (peripheral vascular disease) (HCC)   Controlled diabetes mellitus type 2 with complications (HCC)   Benign essential HTN   Thrombocytosis   PAD (peripheral artery disease) (HCC)   Cutaneous lupus erythematosus   COPD (chronic obstructive pulmonary disease) (HCC)   Symptomatic anemia // Iron deficiency anemia //  Acute GI bleeding: -Admitted with symptomatic anemia. -Transfused with packed red blood cells. -Hemoglobin has improved from 6.4 g/dL to 7.7 g/dL. -Continue PPI. -GI input is appreciated. -Patient underwent  EGD today (see above documentation). -For colonoscopy on 05/16/2023. -Known history of IDA.   -So received IV iron.    Acute cystitis/bacteremia: -Urine culture is growing Pseudomonas. -Blood cultures growing Pseudomonas. -Patient On IV cefepime. -Follow final cultures.     CKD stage 3B: -Stable at baseline   Benign essential HTN: -Continue Norvasc, Cozaar resumed -Atenolol 50 mg daily, HCTZ 12.5 mg daily to be resumed based on titration of BP    PAD/ PVD (peripheral vascular disease) (HCC):   Thrombocytosis: -Resolved.   DVT prophylaxis: SCD. Code Status: Full code. Family Communication: Daughter was at the bedside. Disposition Plan: Likely home eventually.   Consultants:  GI.  Procedures:  EGD.  Antimicrobials:  IV Rocephin was continued. IV cefepime.   Subjective: -No new symptoms. -No shortness of breath. -No chest pain  Objective: Vitals:   05/14/23 1342 05/14/23 1344 05/14/23 1400 05/14/23 1403  BP: (!) 139/26 (!) 159/33 (!) 154/119 (!) 162/33  Pulse: 66 65 (!) 59 62  Resp: (!) 24 (!) 24 15 20   Temp:      TempSrc:      SpO2: 93% 90% 94% 94%  Weight:      Height:        Intake/Output Summary (Last 24 hours) at 05/14/2023 1839 Last data filed at 05/14/2023 1836 Gross per 24 hour  Intake 460 ml  Output 500 ml  Net -40 ml   Filed Weights   05/12/23 1747  Weight: 79.4 kg    Examination:  General exam: Appears calm and comfortable.  Patient is pale.  Patient is status post right above-knee amputation Respiratory system: Clear to auscultation.  Cardiovascular system: S1 & S2 heard. Gastrointestinal system: Abdomen is obese, soft and nontender.   Central nervous system: Alert and oriented.  Patient moves all extremities. Extremities: Status post right AKA.  Mild edema of left lower extremity.  Data Reviewed: I have personally reviewed following labs and imaging studies  CBC: Recent Labs  Lab 05/12/23 1840 05/13/23 1058  WBC 17.9*  20.7*  NEUTROABS 15.6* 18.2*  HGB 6.4* 7.7*  HCT 20.6* 24.4*  MCV 72.8* 79.0*  PLT 436* 342   Basic Metabolic Panel: Recent Labs  Lab 05/12/23 1840 05/13/23 1058  NA 131* 138  K 4.1 4.1  CL 101 104  CO2 19* 21*  GLUCOSE 135* 117*  BUN 32* 29*  CREATININE 1.80* 1.67*  CALCIUM 8.8* 9.4  MG  --  2.0  PHOS  --  4.8*   GFR: Estimated Creatinine Clearance: 25.8 mL/min (A) (by C-G formula based on SCr of 1.67 mg/dL (H)). Liver Function Tests: Recent Labs  Lab 05/12/23 1840 05/13/23 1058  AST 20 17  ALT 12 11  ALKPHOS 54 49  BILITOT 0.6 1.0  PROT 7.9 7.2  ALBUMIN 3.3* 3.1*   No results for input(s): "LIPASE", "AMYLASE" in the last 168 hours. No results for input(s): "AMMONIA" in the last 168 hours. Coagulation Profile: Recent Labs  Lab 05/12/23 1840  INR 1.3*   Cardiac Enzymes: No results for input(s): "CKTOTAL", "CKMB", "CKMBINDEX", "TROPONINI" in the last 168 hours. BNP (last 3 results) No results for input(s): "PROBNP" in the last 8760 hours. HbA1C: No results for input(s): "HGBA1C" in the last 72 hours. CBG: No results for input(s): "GLUCAP" in the last 168 hours. Lipid Profile: No results for input(s): "CHOL", "HDL", "LDLCALC", "TRIG", "CHOLHDL", "LDLDIRECT" in the last 72 hours. Thyroid Function Tests: No results for input(s): "TSH", "T4TOTAL", "FREET4", "T3FREE", "THYROIDAB" in the last 72 hours. Anemia Panel: Recent Labs    05/12/23 1934  VITAMINB12 684  FOLATE 29.0  FERRITIN 346*  TIBC 231*  IRON 25*  RETICCTPCT 5.0*   Urine analysis:    Component Value Date/Time   COLORURINE YELLOW 05/12/2023 1824   APPEARANCEUR CLOUDY (A) 05/12/2023 1824   LABSPEC 1.015 05/12/2023 1824   PHURINE 5.5 05/12/2023 1824   GLUCOSEU NEGATIVE 05/12/2023 1824   HGBUR SMALL (A) 05/12/2023 1824   BILIRUBINUR NEGATIVE 05/12/2023 1824   KETONESUR NEGATIVE 05/12/2023 1824   PROTEINUR 100 (A) 05/12/2023 1824   NITRITE NEGATIVE 05/12/2023 1824   LEUKOCYTESUR LARGE  (A) 05/12/2023 1824   Sepsis Labs: @LABRCNTIP (procalcitonin:4,lacticidven:4)  ) Recent Results (from the past 240 hour(s))  SARS Coronavirus 2 by RT PCR (hospital order, performed in [facility] hospital lab) *cepheid single result test* Anterior Nasal Swab     Status: None   Collection Time: 05/12/23  5:52 PM   Specimen: Anterior Nasal Swab  Result Value Ref Range Status   SARS Coronavirus 2 by RT PCR NEGATIVE NEGATIVE Final    Comment: (NOTE) SARS-CoV-2 target nucleic acids are NOT DETECTED.  The SARS-CoV-2 RNA is generally detectable in upper and lower respiratory specimens during the acute phase of infection. The lowest concentration of SARS-CoV-2 viral copies this assay can detect is 250 copies / mL. A negative result does not preclude SARS-CoV-2 infection and should not be used as the sole basis for treatment or other patient management decisions.  A negative result may occur with improper specimen collection / handling, submission of specimen other than  nasopharyngeal swab, presence of viral mutation(s) within the areas targeted by this assay, and inadequate number of viral copies (<250 copies / mL). A negative result must be combined with clinical observations, patient history, and epidemiological information.  Fact Sheet for Patients:   RoadLapTop.co.za  Fact Sheet for Healthcare Providers: http://kim-miller.com/  This test is not yet approved or  cleared by the [place] FDA and has been authorized for detection and/or diagnosis of SARS-CoV-2 by FDA under an Emergency Use Authorization (EUA).  This EUA will remain in effect (meaning this test can be used) for the duration of the COVID-19 declaration under Section 564(b)(1) of the Act, 21 U.S.C. section 360bbb-3(b)(1), unless the authorization is terminated or revoked sooner.  Performed at [facility], [address] [place], [place] [address]   Urine  Culture     Status: Abnormal (Preliminary result)   Collection Time: 05/12/23  6:24 PM   Specimen: Urine, Random  Result Value Ref Range Status   Specimen Description   Final    URINE, RANDOM Performed at [facility], [address] [place], [place] [address]    Special Requests   Final    NONE Reflexed from [id number] Performed at [facility], [address] [place], [place] [address]    Culture (A)  Final    >=100,000 COLONIES/mL PSEUDOMONAS AERUGINOSA SUSCEPTIBILITIES TO FOLLOW Performed at [facility], [address] [place], [place] [address]    Report Status PENDING  Incomplete  Blood Culture (routine x 2)     Status: Abnormal (Preliminary result)   Collection Time: 05/12/23  6:40 PM   Specimen: BLOOD  Result Value Ref Range Status   Specimen Description   Final    BLOOD RIGHT ANTECUBITAL Performed at [facility], [address], [place], [place] [address]    Special Requests   Final    BOTTLES DRAWN AEROBIC AND ANAEROBIC Blood Culture adequate volume Performed at [facility], [address] [place], [place] [address]    Culture  Setup Time   Final    GRAM NEGATIVE RODS AEROBIC BOTTLE ONLY CRITICAL RESULT CALLED TO, READ BACK BY AND VERIFIED WITH: PHARMD D.WOFFORD [id number] @ 1914 FH Performed at [facility], [address], [place], [place] [address]    Culture PSEUDOMONAS AERUGINOSA (A)  Final   Report Status PENDING  Incomplete  Blood Culture ID Panel (Reflexed)     Status: Abnormal   Collection Time: 05/12/23  6:40 PM  Result Value Ref Range Status   Enterococcus faecalis NOT DETECTED NOT DETECTED Final   Enterococcus Faecium NOT DETECTED NOT DETECTED Final   Listeria monocytogenes NOT DETECTED NOT DETECTED Final   Staphylococcus species NOT DETECTED NOT DETECTED Final   Staphylococcus aureus (BCID) NOT DETECTED NOT DETECTED Final   Staphylococcus epidermidis NOT DETECTED NOT DETECTED Final   Staphylococcus  lugdunensis NOT DETECTED NOT DETECTED Final   Streptococcus species NOT DETECTED NOT DETECTED Final   Streptococcus agalactiae NOT DETECTED NOT DETECTED Final   Streptococcus pneumoniae NOT DETECTED NOT DETECTED Final   Streptococcus pyogenes NOT DETECTED NOT DETECTED Final   A.calcoaceticus-baumannii NOT DETECTED NOT DETECTED Final   Bacteroides fragilis NOT DETECTED NOT DETECTED Final   Enterobacterales NOT DETECTED NOT DETECTED Final   Enterobacter cloacae complex NOT DETECTED NOT DETECTED Final   Escherichia coli NOT DETECTED NOT DETECTED Final   Klebsiella aerogenes NOT DETECTED NOT DETECTED Final  Klebsiella oxytoca NOT DETECTED NOT DETECTED Final   Klebsiella pneumoniae NOT DETECTED NOT DETECTED Final   Proteus species NOT DETECTED NOT DETECTED Final   Salmonella species NOT DETECTED NOT DETECTED Final   Serratia marcescens NOT DETECTED NOT DETECTED Final   Haemophilus influenzae NOT DETECTED NOT DETECTED Final   Neisseria meningitidis NOT DETECTED NOT DETECTED Final   Pseudomonas aeruginosa DETECTED (A) NOT DETECTED Final    Comment: CRITICAL RESULT CALLED TO, READ BACK BY AND VERIFIED WITH: DREW WOFFORD ON 8/26 @ 1914 BY FH    Stenotrophomonas maltophilia NOT DETECTED NOT DETECTED Final   Candida albicans NOT DETECTED NOT DETECTED Final   Candida auris NOT DETECTED NOT DETECTED Final   Candida glabrata NOT DETECTED NOT DETECTED Final   Candida krusei NOT DETECTED NOT DETECTED Final   Candida parapsilosis NOT DETECTED NOT DETECTED Final   Candida tropicalis NOT DETECTED NOT DETECTED Final   Cryptococcus neoformans/gattii NOT DETECTED NOT DETECTED Final   CTX-M ESBL NOT DETECTED NOT DETECTED Final   Carbapenem resistance IMP NOT DETECTED NOT DETECTED Final   Carbapenem resistance KPC NOT DETECTED NOT DETECTED Final   Carbapenem resistance NDM NOT DETECTED NOT DETECTED Final   Carbapenem resistance VIM NOT DETECTED NOT DETECTED Final    Comment: Performed at [facility], [address] [place], [place] [address]  Blood Culture (routine x 2)     Status: None (Preliminary result)   Collection Time: 05/12/23  6:52 PM   Specimen: BLOOD  Result Value Ref Range Status   Specimen Description   Final    BLOOD LEFT ANTECUBITAL Performed at [facility], [address], [place], [place] [address]    Special Requests   Final    BOTTLES DRAWN AEROBIC AND ANAEROBIC Blood Culture adequate volume Performed at [facility], [address] [place], [place] [address]    Culture  Setup Time   Final    GRAM NEGATIVE RODS AEROBIC BOTTLE ONLY CRITICAL VALUE NOTED.  VALUE IS CONSISTENT WITH PREVIOUSLY REPORTED AND CALLED VALUE. CRITICAL RESULT CALLED TO, READ BACK BY AND VERIFIED WITH: PHARMD J.GIDHIA AT 9147 ON 05/14/2023 BY T.SAAD. Performed at [facility], [address] [place], [place] [address]    Culture GRAM NEGATIVE RODS  Final   Report Status PENDING  Incomplete         Radiology Studies: No results found.      Scheduled Meds:  sodium chloride   Intravenous Once   amLODipine  10 mg Oral Daily   hydrochlorothiazide  12.5 mg Oral Daily   losartan  100 mg Oral Daily   pantoprazole (PROTONIX) IV  40 mg Intravenous Q12H   sucralfate  1 g Oral Q6H   Continuous Infusions:  ceFEPime (MAXIPIME) IV 2 g (05/14/23 1803)   lactated ringers 10 mL/hr at 05/14/23 1310     LOS: 2 days    Time spent: 55 minutes.    Berton Mount, MD  [facility] Pager #: [phone] 7PM-7AM contact night coverage as above

## 2023-05-15 ENCOUNTER — Encounter (HOSPITAL_COMMUNITY): Payer: Self-pay | Admitting: Internal Medicine

## 2023-05-15 DIAGNOSIS — K922 Gastrointestinal hemorrhage, unspecified: Secondary | ICD-10-CM | POA: Diagnosis not present

## 2023-05-15 DIAGNOSIS — D75839 Thrombocytosis, unspecified: Secondary | ICD-10-CM | POA: Diagnosis not present

## 2023-05-15 DIAGNOSIS — B965 Pseudomonas (aeruginosa) (mallei) (pseudomallei) as the cause of diseases classified elsewhere: Secondary | ICD-10-CM

## 2023-05-15 DIAGNOSIS — R7881 Bacteremia: Secondary | ICD-10-CM

## 2023-05-15 DIAGNOSIS — I739 Peripheral vascular disease, unspecified: Secondary | ICD-10-CM | POA: Diagnosis not present

## 2023-05-15 DIAGNOSIS — N3000 Acute cystitis without hematuria: Secondary | ICD-10-CM

## 2023-05-15 DIAGNOSIS — D649 Anemia, unspecified: Secondary | ICD-10-CM | POA: Diagnosis not present

## 2023-05-15 LAB — MAGNESIUM: Magnesium: 1.9 mg/dL (ref 1.7–2.4)

## 2023-05-15 LAB — CBC WITH DIFFERENTIAL/PLATELET
Abs Immature Granulocytes: 0.07 10*3/uL (ref 0.00–0.07)
Basophils Absolute: 0.1 10*3/uL (ref 0.0–0.1)
Basophils Relative: 1 %
Eosinophils Absolute: 0.5 10*3/uL (ref 0.0–0.5)
Eosinophils Relative: 5 %
HCT: 27.4 % — ABNORMAL LOW (ref 36.0–46.0)
Hemoglobin: 8.4 g/dL — ABNORMAL LOW (ref 12.0–15.0)
Immature Granulocytes: 1 %
Lymphocytes Relative: 10 %
Lymphs Abs: 0.9 10*3/uL (ref 0.7–4.0)
MCH: 24.5 pg — ABNORMAL LOW (ref 26.0–34.0)
MCHC: 30.7 g/dL (ref 30.0–36.0)
MCV: 79.9 fL — ABNORMAL LOW (ref 80.0–100.0)
Monocytes Absolute: 0.7 10*3/uL (ref 0.1–1.0)
Monocytes Relative: 7 %
Neutro Abs: 7 10*3/uL (ref 1.7–7.7)
Neutrophils Relative %: 76 %
Platelets: 347 10*3/uL (ref 150–400)
RBC: 3.43 MIL/uL — ABNORMAL LOW (ref 3.87–5.11)
RDW: 24.4 % — ABNORMAL HIGH (ref 11.5–15.5)
WBC: 9.2 10*3/uL (ref 4.0–10.5)
nRBC: 0 % (ref 0.0–0.2)

## 2023-05-15 LAB — RENAL FUNCTION PANEL
Albumin: 2.7 g/dL — ABNORMAL LOW (ref 3.5–5.0)
Anion gap: 7 (ref 5–15)
BUN: 20 mg/dL (ref 8–23)
CO2: 22 mmol/L (ref 22–32)
Calcium: 8.8 mg/dL — ABNORMAL LOW (ref 8.9–10.3)
Chloride: 106 mmol/L (ref 98–111)
Creatinine, Ser: 1.4 mg/dL — ABNORMAL HIGH (ref 0.44–1.00)
GFR, Estimated: 38 mL/min — ABNORMAL LOW (ref >60)
Glucose, Bld: 169 mg/dL — ABNORMAL HIGH (ref 70–99)
Phosphorus: 2.4 mg/dL — ABNORMAL LOW (ref 2.5–4.6)
Potassium: 4.1 mmol/L (ref 3.5–5.1)
Sodium: 135 mmol/L (ref 135–145)

## 2023-05-15 LAB — CULTURE, BLOOD (ROUTINE X 2)
Special Requests: ADEQUATE
Special Requests: ADEQUATE

## 2023-05-15 LAB — URINE CULTURE: Culture: >=100000 — AB

## 2023-05-15 MED ORDER — VITAMIN D3 25 MCG (1000 UNIT) PO TABS
1000.0000 [IU] | ORAL_TABLET | Freq: Every day | ORAL | Status: DC
  Administered 2023-05-15 – 2023-05-17 (×3): 1000 [IU] via ORAL
  Filled 2023-05-15 (×3): qty 1

## 2023-05-15 MED ORDER — ATORVASTATIN CALCIUM 10 MG PO TABS
10.0000 mg | ORAL_TABLET | Freq: Every day | ORAL | Status: DC
  Administered 2023-05-15 – 2023-05-17 (×3): 10 mg via ORAL
  Filled 2023-05-15 (×3): qty 1

## 2023-05-15 MED ORDER — ASPIRIN 81 MG PO TBEC
81.0000 mg | DELAYED_RELEASE_TABLET | Freq: Every day | ORAL | Status: DC
  Administered 2023-05-15 – 2023-05-17 (×3): 81 mg via ORAL
  Filled 2023-05-15 (×3): qty 1

## 2023-05-15 MED ORDER — PEG 3350-KCL-NA BICARB-NACL 420 G PO SOLR
4000.0000 mL | Freq: Once | ORAL | Status: AC
  Administered 2023-05-15: 4000 mL via ORAL

## 2023-05-15 MED ORDER — SODIUM CHLORIDE 0.9 % IV SOLN
2.0000 g | Freq: Two times a day (BID) | INTRAVENOUS | Status: DC
  Administered 2023-05-15 – 2023-05-17 (×5): 2 g via INTRAVENOUS
  Filled 2023-05-15 (×5): qty 2

## 2023-05-15 MED ORDER — BISACODYL 5 MG PO TBEC
5.0000 mg | DELAYED_RELEASE_TABLET | Freq: Once | ORAL | Status: AC
  Administered 2023-05-15: 5 mg via ORAL
  Filled 2023-05-15: qty 1

## 2023-05-15 MED ORDER — K PHOS MONO-SOD PHOS DI & MONO 155-852-130 MG PO TABS
250.0000 mg | ORAL_TABLET | Freq: Two times a day (BID) | ORAL | Status: DC
  Administered 2023-05-15 – 2023-05-17 (×4): 250 mg via ORAL
  Filled 2023-05-15 (×4): qty 1

## 2023-05-15 NOTE — H&P (View-Only) (Signed)
[facility] Progress Note  SUBJECTIVE:   Interval history: Jessica Abbott was seen and evaluated today at bedside. Resting comfortably in bed. Denied abdominal pain. Noted having some abdominal bloating. No nausea or vomiting, tolerating bowel preparation this afternoon. No chest pain or shortness of breath. Agreeable to colonoscopy tomorrow AM.  Past Medical History:  Diagnosis Date   Anemia    Aortic regurgitation    moderate AR by 11/25/15 echo   Arthritis    Chronic kidney disease    Diabetes mellitus without complication (HCC)    History of blood transfusion    History of bronchitis    History of pneumonia    Hypertension    Peripheral arterial disease (HCC)    Peripheral vascular disease (HCC)    Pulmonary hypertension (HCC)    11/25/15 echo: PA systolic pressure severely increaed, PA peak pressure 66 mm Hg.    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   SOB (shortness of breath)    Stress incontinence    Past Surgical History:  Procedure Laterality Date   ABDOMINAL AORTOGRAM W/LOWER EXTREMITY N/A 10/22/2016   Procedure: Abdominal Aortogram w/Lower Extremity;  Surgeon: Chuck Hint, MD;  Location: [facility] INVASIVE CV LAB;  Service: Cardiovascular;  Laterality: N/A;  lt leg   ABDOMINAL HYSTERECTOMY     AMPUTATION Right 12/23/2015   Procedure: RIGHT  ABOVE KNEE AMPUTATION;  Surgeon: Chuck Hint, MD;  Location: [facility] OR;  Service: Vascular;  Laterality: Right;   BACK SURGERY  2002   COLONOSCOPY N/A 11/24/2015   Procedure: COLONOSCOPY;  Surgeon: Bernette Redbird, MD;  Location: [facility] ENDOSCOPY;  Service: Endoscopy;  Laterality: N/A;   ESOPHAGOGASTRODUODENOSCOPY (EGD) WITH PROPOFOL N/A 11/23/2015   Procedure: ESOPHAGOGASTRODUODENOSCOPY (EGD) WITH PROPOFOL;  Surgeon: Bernette Redbird, MD;  Location: [facility] ENDOSCOPY;  Service: Endoscopy;  Laterality: N/A;   femoral artery arteriogram  04/20/2015   GIVENS CAPSULE STUDY N/A 11/26/2015   Procedure: GIVENS CAPSULE  STUDY;  Surgeon: Bernette Redbird, MD;  Location: [facility] ENDOSCOPY;  Service: Endoscopy;  Laterality: N/A;   Current Facility-Administered Medications  Medication Dose Route Frequency Provider Last Rate Last Admin   0.9 %  sodium chloride infusion (Manually program via Guardrails IV Fluids)   Intravenous Once Lynann Bologna, DO   Stopped at 05/14/23 1026   acetaminophen (TYLENOL) tablet 650 mg  650 mg Oral Q6H PRN Liliane Shi H, DO   650 mg at 05/13/23 1610   Or   acetaminophen (TYLENOL) suppository 650 mg  650 mg Rectal Q6H PRN Lynann Bologna, DO       amLODipine (NORVASC) tablet 10 mg  10 mg Oral Daily Liliane Shi H, DO   10 mg at 05/15/23 0955   cefTAZidime (FORTAZ) 2 g in sodium chloride 0.9 % 100 mL IVPB  2 g Intravenous Q12H Rodolph Bong, MD 200 mL/hr at 05/15/23 1349 2 g at 05/15/23 1349   guaiFENesin-dextromethorphan (ROBITUSSIN DM) 100-10 MG/5ML syrup 5 mL  5 mL Oral Q4H PRN Liliane Shi H, DO   5 mL at 05/14/23 2041   hydrochlorothiazide (HYDRODIURIL) tablet 12.5 mg  12.5 mg Oral Daily Liliane Shi H, DO   12.5 mg at 05/15/23 9604   lactated ringers infusion   Intravenous Continuous Liliane Shi H, DO 10 mL/hr at 05/14/23 1310 Restarted at 05/14/23 1324   losartan (COZAAR) tablet 100 mg  100 mg Oral Daily Liliane Shi H, DO   100 mg at 05/15/23 0955   ondansetron (ZOFRAN) injection 4 mg  4 mg Intravenous Q6H PRN Liliane Shi H, DO   4 mg at 05/13/23 0536   pantoprazole (PROTONIX) injection 40 mg  40 mg Intravenous Q12H Liliane Shi H, DO   40 mg at 05/15/23 0955   sucralfate (CARAFATE) tablet 1 g  1 g Oral Q6H Liliane Shi H, DO   1 g at 05/15/23 1141   Allergies as of 05/12/2023 - Review Complete 05/12/2023  Allergen Reaction Noted   Gabapentin Other (See Comments) 03/14/2016   Review of Systems:  Review of Systems  Respiratory:  Negative for shortness of breath.   Cardiovascular:  Negative for chest pain.  Gastrointestinal:  Negative  for abdominal pain, nausea and vomiting.    OBJECTIVE:   Temp:  [97.7 F (36.5 C)-98.7 F (37.1 C)] 97.7 F (36.5 C) (08/28 1328) Pulse Rate:  [64-73] 64 (08/28 1328) Resp:  [16-17] 16 (08/28 1328) BP: (154-161)/(47-61) 155/61 (08/28 1328) SpO2:  [94 %-100 %] 96 % (08/28 1328) Last BM Date : 05/13/23 Physical Exam Constitutional:      General: She is not in acute distress.    Appearance: She is not ill-appearing, toxic-appearing or diaphoretic.  Cardiovascular:     Rate and Rhythm: Normal rate and regular rhythm.  Pulmonary:     Effort: No respiratory distress.     Breath sounds: Normal breath sounds.  Abdominal:     General: Bowel sounds are normal. There is no distension.     Palpations: Abdomen is soft.     Tenderness: There is no abdominal tenderness. There is no guarding.  Neurological:     Mental Status: She is alert.     Labs: Recent Labs    05/12/23 1840 05/13/23 1058 05/15/23 1104  WBC 17.9* 20.7* 9.2  HGB 6.4* 7.7* 8.4*  HCT 20.6* 24.4* 27.4*  PLT 436* 342 347   BMET Recent Labs    05/12/23 1840 05/13/23 1058 05/15/23 1104  NA 131* 138 135  K 4.1 4.1 4.1  CL 101 104 106  CO2 19* 21* 22  GLUCOSE 135* 117* 169*  BUN 32* 29* 20  CREATININE 1.80* 1.67* 1.40*  CALCIUM 8.8* 9.4 8.8*   LFT Recent Labs    05/13/23 1058 05/15/23 1104  PROT 7.2  --   ALBUMIN 3.1* 2.7*  AST 17  --   ALT 11  --   ALKPHOS 49  --   BILITOT 1.0  --    PT/INR Recent Labs    05/12/23 1840  LABPROT 16.4*  INR 1.3*   Diagnostic imaging: No results found.  IMPRESSION: Microcytic anemia, iron deficiency  -EGD 05/14/23 unremarkable Leukocytosis, cystitis History pan-colonic diverticulosis History hiatal hernia History diabetes mellitus History chronic kidney disease  PLAN: -Colonoscopy 05/16/23 -Clear liquid diet today, completing bowel preparation, NPO at midnight -Trend H/H, transfuse for Hgb < 7  -Ok for low dose aspirin -Further recommendations to  follow pending procedure   LOS: 3 days   Liliane Shi, DO [facility]

## 2023-05-15 NOTE — Progress Notes (Signed)
[facility] Progress Note  SUBJECTIVE:   Interval history: Jessica Abbott was seen and evaluated today at bedside. Resting comfortably in bed. Denied abdominal pain. Noted having some abdominal bloating. No nausea or vomiting, tolerating bowel preparation this afternoon. No chest pain or shortness of breath. Agreeable to colonoscopy tomorrow AM.  Past Medical History:  Diagnosis Date   Anemia    Aortic regurgitation    moderate AR by 11/25/15 echo   Arthritis    Chronic kidney disease    Diabetes mellitus without complication (HCC)    History of blood transfusion    History of bronchitis    History of pneumonia    Hypertension    Peripheral arterial disease (HCC)    Peripheral vascular disease (HCC)    Pulmonary hypertension (HCC)    11/25/15 echo: PA systolic pressure severely increaed, PA peak pressure 66 mm Hg.    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   SOB (shortness of breath)    Stress incontinence    Past Surgical History:  Procedure Laterality Date   ABDOMINAL AORTOGRAM W/LOWER EXTREMITY N/A 10/22/2016   Procedure: Abdominal Aortogram w/Lower Extremity;  Surgeon: Chuck Hint, MD;  Location: [facility] INVASIVE CV LAB;  Service: Cardiovascular;  Laterality: N/A;  lt leg   ABDOMINAL HYSTERECTOMY     AMPUTATION Right 12/23/2015   Procedure: RIGHT  ABOVE KNEE AMPUTATION;  Surgeon: Chuck Hint, MD;  Location: [facility] OR;  Service: Vascular;  Laterality: Right;   BACK SURGERY  2002   COLONOSCOPY N/A 11/24/2015   Procedure: COLONOSCOPY;  Surgeon: Bernette Redbird, MD;  Location: [facility] ENDOSCOPY;  Service: Endoscopy;  Laterality: N/A;   ESOPHAGOGASTRODUODENOSCOPY (EGD) WITH PROPOFOL N/A 11/23/2015   Procedure: ESOPHAGOGASTRODUODENOSCOPY (EGD) WITH PROPOFOL;  Surgeon: Bernette Redbird, MD;  Location: [facility] ENDOSCOPY;  Service: Endoscopy;  Laterality: N/A;   femoral artery arteriogram  04/20/2015   GIVENS CAPSULE STUDY N/A 11/26/2015   Procedure: GIVENS CAPSULE  STUDY;  Surgeon: Bernette Redbird, MD;  Location: [facility] ENDOSCOPY;  Service: Endoscopy;  Laterality: N/A;   Current Facility-Administered Medications  Medication Dose Route Frequency Provider Last Rate Last Admin   0.9 %  sodium chloride infusion (Manually program via Guardrails IV Fluids)   Intravenous Once Lynann Bologna, DO   Stopped at 05/14/23 1026   acetaminophen (TYLENOL) tablet 650 mg  650 mg Oral Q6H PRN Liliane Shi H, DO   650 mg at 05/13/23 1610   Or   acetaminophen (TYLENOL) suppository 650 mg  650 mg Rectal Q6H PRN Lynann Bologna, DO       amLODipine (NORVASC) tablet 10 mg  10 mg Oral Daily Liliane Shi H, DO   10 mg at 05/15/23 0955   cefTAZidime (FORTAZ) 2 g in sodium chloride 0.9 % 100 mL IVPB  2 g Intravenous Q12H Rodolph Bong, MD 200 mL/hr at 05/15/23 1349 2 g at 05/15/23 1349   guaiFENesin-dextromethorphan (ROBITUSSIN DM) 100-10 MG/5ML syrup 5 mL  5 mL Oral Q4H PRN Liliane Shi H, DO   5 mL at 05/14/23 2041   hydrochlorothiazide (HYDRODIURIL) tablet 12.5 mg  12.5 mg Oral Daily Liliane Shi H, DO   12.5 mg at 05/15/23 9604   lactated ringers infusion   Intravenous Continuous Liliane Shi H, DO 10 mL/hr at 05/14/23 1310 Restarted at 05/14/23 1324   losartan (COZAAR) tablet 100 mg  100 mg Oral Daily Liliane Shi H, DO   100 mg at 05/15/23 0955   ondansetron (ZOFRAN) injection 4 mg  4 mg Intravenous Q6H PRN Liliane Shi H, DO   4 mg at 05/13/23 0536   pantoprazole (PROTONIX) injection 40 mg  40 mg Intravenous Q12H Liliane Shi H, DO   40 mg at 05/15/23 0955   sucralfate (CARAFATE) tablet 1 g  1 g Oral Q6H Liliane Shi H, DO   1 g at 05/15/23 1141   Allergies as of 05/12/2023 - Review Complete 05/12/2023  Allergen Reaction Noted   Gabapentin Other (See Comments) 03/14/2016   Review of Systems:  Review of Systems  Respiratory:  Negative for shortness of breath.   Cardiovascular:  Negative for chest pain.  Gastrointestinal:  Negative  for abdominal pain, nausea and vomiting.    OBJECTIVE:   Temp:  [97.7 F (36.5 C)-98.7 F (37.1 C)] 97.7 F (36.5 C) (08/28 1328) Pulse Rate:  [64-73] 64 (08/28 1328) Resp:  [16-17] 16 (08/28 1328) BP: (154-161)/(47-61) 155/61 (08/28 1328) SpO2:  [94 %-100 %] 96 % (08/28 1328) Last BM Date : 05/13/23 Physical Exam Constitutional:      General: She is not in acute distress.    Appearance: She is not ill-appearing, toxic-appearing or diaphoretic.  Cardiovascular:     Rate and Rhythm: Normal rate and regular rhythm.  Pulmonary:     Effort: No respiratory distress.     Breath sounds: Normal breath sounds.  Abdominal:     General: Bowel sounds are normal. There is no distension.     Palpations: Abdomen is soft.     Tenderness: There is no abdominal tenderness. There is no guarding.  Neurological:     Mental Status: She is alert.     Labs: Recent Labs    05/12/23 1840 05/13/23 1058 05/15/23 1104  WBC 17.9* 20.7* 9.2  HGB 6.4* 7.7* 8.4*  HCT 20.6* 24.4* 27.4*  PLT 436* 342 347   BMET Recent Labs    05/12/23 1840 05/13/23 1058 05/15/23 1104  NA 131* 138 135  K 4.1 4.1 4.1  CL 101 104 106  CO2 19* 21* 22  GLUCOSE 135* 117* 169*  BUN 32* 29* 20  CREATININE 1.80* 1.67* 1.40*  CALCIUM 8.8* 9.4 8.8*   LFT Recent Labs    05/13/23 1058 05/15/23 1104  PROT 7.2  --   ALBUMIN 3.1* 2.7*  AST 17  --   ALT 11  --   ALKPHOS 49  --   BILITOT 1.0  --    PT/INR Recent Labs    05/12/23 1840  LABPROT 16.4*  INR 1.3*   Diagnostic imaging: No results found.  IMPRESSION: Microcytic anemia, iron deficiency  -EGD 05/14/23 unremarkable Leukocytosis, cystitis History pan-colonic diverticulosis History hiatal hernia History diabetes mellitus History chronic kidney disease  PLAN: -Colonoscopy 05/16/23 -Clear liquid diet today, completing bowel preparation, NPO at midnight -Trend H/H, transfuse for Hgb < 7  -Ok for low dose aspirin -Further recommendations to  follow pending procedure   LOS: 3 days   Liliane Shi, DO [facility]

## 2023-05-15 NOTE — Progress Notes (Addendum)
PROGRESS NOTE    Jessica Abbott  ZOX:[id number] DOB: 01/13/1941 DOA: 05/12/2023 PCP: [name], DO    Chief Complaint  Patient presents with   Shortness of Breath   Fatigue    Brief Narrative:  Patient is an 82 year old female with PMHx of IDA, HTN, T2DM, CKD stage IIIb, moderate COPD, cutaneous lupus, PAD sp R AKA, PVD, wheelchair-bound.  Patient was admitted with symptomatic anemia.  On presentation, hemoglobin was 6.4 g/dL.  Patient has history of iron deficiency anemia.  Last colonoscopy was 1 to 2 years ago.  Solid versus bedside to have been cauterized.  Capsule endoscopy done about the same time was nonrevealing.  Patient is known to [facility].  No history of NSAIDs.  GI team has been consulted.   05/13/2023: Patient seen alongside patient's daughter.  GI team has been consulted as well.  Patient has been transfused 2 units of packed red blood cells.  Patient has also gotten IV iron.  Awaiting GI input.  Further management will depend on hospital course.   05/14/2023: Patient seen.  No new complaints.  Patient underwent EGD today.  EGD revealed gastritis that was biopsied.  GI team plans to proceed with colonoscopy on 05/16/2023.  Hemoglobin is 7.7 g/dL today.  Urine and blood cultures are growing Pseudomonas.  Patient is currently on IV cefepime.  Follow final cultures.   Assessment & Plan:   Principal Problem:   Symptomatic anemia Active Problems:   Acute GI bleeding   Acute renal failure (HCC)   PVD (peripheral vascular disease) (HCC)   Controlled diabetes mellitus type 2 with complications (HCC)   Benign essential HTN   Thrombocytosis   PAD (peripheral artery disease) (HCC)   Cutaneous lupus erythematosus   COPD (chronic obstructive pulmonary disease) (HCC)   Bacteremia due to Pseudomonas   Acute cystitis without hematuria  #1 symptomatic anemia/iron deficiency anemia/acute GI bleed -Patient presented with symptomatic anemia with generalized weakness, shortness  of breath. -Hemoglobin noted admission was 6.4. -FOBT positive. -Patient denied any overt bleeding. -Status posttransfusion 1 unit PRBCs and IV iron with hemoglobin currently at 8.4. -Patient underwent upper endoscopy on 05/14/2023 with gastritis noted. -Continue IV PPI twice daily and Carafate. -Patient currently receiving bowel prep and patient for colonoscopy tomorrow. -GI following and appreciate input and recommendations.  2.  Acute cystitis/Pseudomonas bacteremia -Urinalysis consistent with UTI with urine cultures positive for Pseudomonas. -Blood cultures also noted to be positive for Pseudomonas. -Patient was on IV cefepime and has been changed to IV [place].  3.  CKD stage IIIb -Labile.  4.  Hypertension -Continue home regimen Norvasc, HCTZ, Cozaar.  5.  PAD/PVD -Resume home regimen aspirin 81 mg daily, Lipitor 10 mg daily.  6.  Thrombocytosis -Resolved.  7.  Hypophosphatemia -Replete.   DVT prophylaxis: SCDs Code Status: Full Family Communication: Updated patient.  No family at bedside. Disposition: TBD  Status is: Inpatient Remains inpatient appropriate because: Severity of illness   Consultants:  Gastroenterology: Dr. [name] 05/13/2023  Procedures:  Chest x-ray 05/12/2023 Upper endoscopy 05/14/2023 per Dr [name]. Transfusion 1 unit PRBCs 05/13/2023  Antimicrobials:  Anti-infectives (From admission, onward)    Start     Dose/Rate Route Frequency Ordered Stop   05/15/23 1315  cefTAZidime (FORTAZ) 2 g in sodium chloride 0.9 % 100 mL IVPB        2 g 200 mL/hr over 30 Minutes Intravenous Every 12 hours 05/15/23 1216     05/13/23 2030  ceFEPIme (MAXIPIME) 2 g in sodium  chloride 0.9 % 100 mL IVPB  Status:  Discontinued        2 g 200 mL/hr over 30 Minutes Intravenous Daily-1800 05/13/23 1930 05/15/23 1216   05/13/23 1900  cefTRIAXone (ROCEPHIN) 1 g in sodium chloride 0.9 % 100 mL IVPB  Status:  Discontinued        1 g 200 mL/hr over 30 Minutes Intravenous  Every 24 hours 05/12/23 2234 05/13/23 1930   05/12/23 1945  cefTRIAXone (ROCEPHIN) 2 g in sodium chloride 0.9 % 100 mL IVPB        2 g 200 mL/hr over 30 Minutes Intravenous  Once 05/12/23 1939 05/12/23 2049         Subjective: Patient lying in bed.  States overall feels much better than she did on admission.  Shortness of breath and wheezing have improved.  Denies any overt bleeding noted.  Objective: Vitals:   05/14/23 1923 05/15/23 0525 05/15/23 1328 05/15/23 1920  BP: (!) 154/49 (!) 161/47 (!) 155/61 (!) 156/51  Pulse: 72 73 64 70  Resp: 17 17 16 19   Temp: 98 F (36.7 C) 98.7 F (37.1 C) 97.7 F (36.5 C) 98.4 F (36.9 C)  TempSrc: Oral Oral Oral Oral  SpO2: 94% 100% 96% 94%  Weight:      Height:        Intake/Output Summary (Last 24 hours) at 05/15/2023 2032 Last data filed at 05/15/2023 2130 Gross per 24 hour  Intake 450 ml  Output 250 ml  Net 200 ml   Filed Weights   05/12/23 1747  Weight: 79.4 kg    Examination:  General exam: Appears calm and comfortable  Respiratory system: Clear to auscultation.  No wheezes, no crackles, no rhonchi.  Fair air movement.  Speaking in full sentences.  Respiratory effort normal. Cardiovascular system: S1 & S2 heard, RRR. No JVD, murmurs, rubs, gallops or clicks. No pedal edema. Gastrointestinal system: Abdomen is nondistended, soft and nontender. No organomegaly or masses felt. Normal bowel sounds heard. Central nervous system: Alert and oriented. No focal neurological deficits. Extremities: s/p  r aka.  Left lower extremity with no edema. Skin: No rashes, lesions or ulcers Psychiatry: Judgement and insight appear normal. Mood & affect appropriate.     Data Reviewed: I have personally reviewed following labs and imaging studies  CBC: Recent Labs  Lab 05/12/23 1840 05/13/23 1058 05/15/23 1104  WBC 17.9* 20.7* 9.2  NEUTROABS 15.6* 18.2* 7.0  HGB 6.4* 7.7* 8.4*  HCT 20.6* 24.4* 27.4*  MCV 72.8* 79.0* 79.9*  PLT 436*  342 347    Basic Metabolic Panel: Recent Labs  Lab 05/12/23 1840 05/13/23 1058 05/15/23 1104  NA 131* 138 135  K 4.1 4.1 4.1  CL 101 104 106  CO2 19* 21* 22  GLUCOSE 135* 117* 169*  BUN 32* 29* 20  CREATININE 1.80* 1.67* 1.40*  CALCIUM 8.8* 9.4 8.8*  MG  --  2.0 1.9  PHOS  --  4.8* 2.4*    GFR: Estimated Creatinine Clearance: 30.7 mL/min (A) (by C-G formula based on SCr of 1.4 mg/dL (H)).  Liver Function Tests: Recent Labs  Lab 05/12/23 1840 05/13/23 1058 05/15/23 1104  AST 20 17  --   ALT 12 11  --   ALKPHOS 54 49  --   BILITOT 0.6 1.0  --   PROT 7.9 7.2  --   ALBUMIN 3.3* 3.1* 2.7*    CBG: No results for input(s): "GLUCAP" in the last 168 hours.   Recent  Results (from the past 240 hour(s))  SARS Coronavirus 2 by RT PCR (hospital order, performed in [facility] hospital lab) *cepheid single result test* Anterior Nasal Swab     Status: None   Collection Time: 05/12/23  5:52 PM   Specimen: Anterior Nasal Swab  Result Value Ref Range Status   SARS Coronavirus 2 by RT PCR NEGATIVE NEGATIVE Final    Comment: (NOTE) SARS-CoV-2 target nucleic acids are NOT DETECTED.  The SARS-CoV-2 RNA is generally detectable in upper and lower respiratory specimens during the acute phase of infection. The lowest concentration of SARS-CoV-2 viral copies this assay can detect is 250 copies / mL. A negative result does not preclude SARS-CoV-2 infection and should not be used as the sole basis for treatment or other patient management decisions.  A negative result may occur with improper specimen collection / handling, submission of specimen other than nasopharyngeal swab, presence of viral mutation(s) within the areas targeted by this assay, and inadequate number of viral copies (<250 copies / mL). A negative result must be combined with clinical observations, patient history, and epidemiological information.  Fact Sheet for Patients:    RoadLapTop.co.za  Fact Sheet for Healthcare Providers: http://kim-miller.com/  This test is not yet approved or  cleared by the [place] FDA and has been authorized for detection and/or diagnosis of SARS-CoV-2 by FDA under an Emergency Use Authorization (EUA).  This EUA will remain in effect (meaning this test can be used) for the duration of the COVID-19 declaration under Section 564(b)(1) of the Act, 21 U.S.C. section 360bbb-3(b)(1), unless the authorization is terminated or revoked sooner.  Performed at [facility], [address] [place], [place] [address]   Urine Culture     Status: Abnormal   Collection Time: 05/12/23  6:24 PM   Specimen: Urine, Random  Result Value Ref Range Status   Specimen Description   Final    URINE, RANDOM Performed at [facility], [address] [place], [place] [address]    Special Requests   Final    NONE Reflexed from [id number] Performed at [facility], [address] [place], [place] [address]    Culture >=100,000 COLONIES/mL PSEUDOMONAS AERUGINOSA (A)  Final   Report Status 05/15/2023 FINAL  Final   Organism ID, Bacteria PSEUDOMONAS AERUGINOSA (A)  Final      Susceptibility   Pseudomonas aeruginosa - MIC*    CEFTAZIDIME 4 SENSITIVE Sensitive     CIPROFLOXACIN <=0.25 SENSITIVE Sensitive     GENTAMICIN <=1 SENSITIVE Sensitive     IMIPENEM <=0.25 SENSITIVE Sensitive     PIP/TAZO 16 SENSITIVE Sensitive     CEFEPIME 4 SENSITIVE Sensitive     * >=100,000 COLONIES/mL PSEUDOMONAS AERUGINOSA  Blood Culture (routine x 2)     Status: Abnormal   Collection Time: 05/12/23  6:40 PM   Specimen: BLOOD  Result Value Ref Range Status   Specimen Description   Final    BLOOD RIGHT ANTECUBITAL Performed at [facility], [address] [place], [place] [address]    Special Requests   Final    BOTTLES DRAWN AEROBIC AND ANAEROBIC Blood Culture adequate  volume Performed at [facility], [address] [place], [place] [address]    Culture  Setup Time   Final    GRAM NEGATIVE RODS AEROBIC BOTTLE ONLY CRITICAL RESULT CALLED TO, READ BACK BY AND VERIFIED WITH: PHARMD D.WOFFORD [id number] @ 1914 FH Performed  at [facility], [address], [place], [place] [address]    Culture PSEUDOMONAS AERUGINOSA (A)  Final   Report Status 05/15/2023 FINAL  Final   Organism ID, Bacteria PSEUDOMONAS AERUGINOSA  Final      Susceptibility   Pseudomonas aeruginosa - MIC*    CEFTAZIDIME 4 SENSITIVE Sensitive     CIPROFLOXACIN <=0.25 SENSITIVE Sensitive     GENTAMICIN <=1 SENSITIVE Sensitive     IMIPENEM <=0.25 SENSITIVE Sensitive     PIP/TAZO 16 SENSITIVE Sensitive     * PSEUDOMONAS AERUGINOSA  Blood Culture ID Panel (Reflexed)     Status: Abnormal   Collection Time: 05/12/23  6:40 PM  Result Value Ref Range Status   Enterococcus faecalis NOT DETECTED NOT DETECTED Final   Enterococcus Faecium NOT DETECTED NOT DETECTED Final   Listeria monocytogenes NOT DETECTED NOT DETECTED Final   Staphylococcus species NOT DETECTED NOT DETECTED Final   Staphylococcus aureus (BCID) NOT DETECTED NOT DETECTED Final   Staphylococcus epidermidis NOT DETECTED NOT DETECTED Final   Staphylococcus lugdunensis NOT DETECTED NOT DETECTED Final   Streptococcus species NOT DETECTED NOT DETECTED Final   Streptococcus agalactiae NOT DETECTED NOT DETECTED Final   Streptococcus pneumoniae NOT DETECTED NOT DETECTED Final   Streptococcus pyogenes NOT DETECTED NOT DETECTED Final   A.calcoaceticus-baumannii NOT DETECTED NOT DETECTED Final   Bacteroides fragilis NOT DETECTED NOT DETECTED Final   Enterobacterales NOT DETECTED NOT DETECTED Final   Enterobacter cloacae complex NOT DETECTED NOT DETECTED Final   Escherichia coli NOT DETECTED NOT DETECTED Final   Klebsiella aerogenes NOT DETECTED NOT DETECTED Final   Klebsiella oxytoca NOT DETECTED NOT DETECTED Final    Klebsiella pneumoniae NOT DETECTED NOT DETECTED Final   Proteus species NOT DETECTED NOT DETECTED Final   Salmonella species NOT DETECTED NOT DETECTED Final   Serratia marcescens NOT DETECTED NOT DETECTED Final   Haemophilus influenzae NOT DETECTED NOT DETECTED Final   Neisseria meningitidis NOT DETECTED NOT DETECTED Final   Pseudomonas aeruginosa DETECTED (A) NOT DETECTED Final    Comment: CRITICAL RESULT CALLED TO, READ BACK BY AND VERIFIED WITH: DREW WOFFORD ON 8/26 @ 1914 BY FH    Stenotrophomonas maltophilia NOT DETECTED NOT DETECTED Final   Candida albicans NOT DETECTED NOT DETECTED Final   Candida auris NOT DETECTED NOT DETECTED Final   Candida glabrata NOT DETECTED NOT DETECTED Final   Candida krusei NOT DETECTED NOT DETECTED Final   Candida parapsilosis NOT DETECTED NOT DETECTED Final   Candida tropicalis NOT DETECTED NOT DETECTED Final   Cryptococcus neoformans/gattii NOT DETECTED NOT DETECTED Final   CTX-M ESBL NOT DETECTED NOT DETECTED Final   Carbapenem resistance IMP NOT DETECTED NOT DETECTED Final   Carbapenem resistance KPC NOT DETECTED NOT DETECTED Final   Carbapenem resistance NDM NOT DETECTED NOT DETECTED Final   Carbapenem resistance VIM NOT DETECTED NOT DETECTED Final    Comment: Performed at [facility], [address] [place], [place] [address]  Blood Culture (routine x 2)     Status: Abnormal   Collection Time: 05/12/23  6:52 PM   Specimen: BLOOD  Result Value Ref Range Status   Specimen Description   Final    BLOOD LEFT ANTECUBITAL Performed at [facility], [address] [place], [place] [address]    Special Requests   Final    BOTTLES DRAWN AEROBIC AND ANAEROBIC Blood Culture adequate volume Performed at [facility], [address] [place], [place] [address]  Culture  Setup Time   Final    GRAM NEGATIVE RODS AEROBIC BOTTLE ONLY CRITICAL VALUE NOTED.  VALUE IS CONSISTENT WITH PREVIOUSLY REPORTED AND CALLED  VALUE. CRITICAL RESULT CALLED TO, READ BACK BY AND VERIFIED WITH: PHARMD J.GIDHIA AT 4782 ON 05/14/2023 BY T.SAAD.    Culture (A)  Final    PSEUDOMONAS AERUGINOSA SUSCEPTIBILITIES PERFORMED ON PREVIOUS CULTURE WITHIN THE LAST 5 DAYS. Performed at [facility], [address], [place], [place] [address]    Report Status 05/15/2023 FINAL  Final         Radiology Studies: No results found.      Scheduled Meds:  sodium chloride   Intravenous Once   amLODipine  10 mg Oral Daily   aspirin EC  81 mg Oral Daily   atorvastatin  10 mg Oral Daily   cholecalciferol  1,000 Units Oral Daily   hydrochlorothiazide  12.5 mg Oral Daily   losartan  100 mg Oral Daily   pantoprazole (PROTONIX) IV  40 mg Intravenous Q12H   sucralfate  1 g Oral Q6H   Continuous Infusions:  cefTAZidime (FORTAZ)  IV 2 g (05/15/23 1349)   lactated ringers 10 mL/hr at 05/14/23 1310     LOS: 3 days    Time spent: 35 minutes    Ramiro Harvest, MD [facility]   To contact the attending provider between 7A-7P or the covering provider during after hours 7P-7A, please log into the web site [email or web address] and access using universal [place] password for that web site. If you do not have the password, please call the hospital operator.  05/15/2023, 8:32 PM

## 2023-05-15 NOTE — Anesthesia Preprocedure Evaluation (Signed)
Anesthesia Evaluation  Patient identified by MRN, date of birth, ID band Patient awake    Reviewed: Allergy & Precautions, NPO status , Patient's Chart, lab work & pertinent test results  Airway Mallampati: II  TM Distance: >3 FB Neck ROM: Full    Dental  (+) Teeth Intact, Dental Advisory Given   Pulmonary COPD, former smoker   Pulmonary exam normal breath sounds clear to auscultation       Cardiovascular hypertension, Pt. on medications pulmonary hypertension+ Peripheral Vascular Disease  Normal cardiovascular exam+ Valvular Problems/Murmurs AI  Rhythm:Regular Rate:Normal     Neuro/Psych  Neuromuscular disease  negative psych ROS   GI/Hepatic negative GI ROS, Neg liver ROS,,,  Endo/Other  diabetes, Type 2  Obesity   Renal/GU Renal InsufficiencyRenal disease Bladder dysfunction      Musculoskeletal  (+) Arthritis ,    Abdominal   Peds  Hematology  (+) Blood dyscrasia, anemia   Anesthesia Other Findings   Reproductive/Obstetrics                             Anesthesia Physical Anesthesia Plan  ASA: 4  Anesthesia Plan: MAC   Post-op Pain Management: Minimal or no pain anticipated   Induction: Intravenous  PONV Risk Score and Plan: 2 and TIVA and Treatment may vary due to age or medical condition  Airway Management Planned:   Additional Equipment:   Intra-op Plan:   Post-operative Plan:   Informed Consent: I have reviewed the patients History and Physical, chart, labs and discussed the procedure including the risks, benefits and alternatives for the proposed anesthesia with the patient or authorized representative who has indicated his/her understanding and acceptance.     Dental advisory given  Plan Discussed with: CRNA  Anesthesia Plan Comments:        Anesthesia Quick Evaluation

## 2023-05-16 ENCOUNTER — Encounter (HOSPITAL_COMMUNITY)
Admission: EM | Disposition: A | Discharge status: Home Health | Payer: Self-pay | Source: Home / Self Care | Attending: Internal Medicine

## 2023-05-16 ENCOUNTER — Encounter (HOSPITAL_COMMUNITY): Payer: Self-pay | Admitting: Family Medicine

## 2023-05-16 ENCOUNTER — Inpatient Hospital Stay (HOSPITAL_COMMUNITY): Payer: Medicare Other | Admitting: Anesthesiology

## 2023-05-16 DIAGNOSIS — I10 Essential (primary) hypertension: Secondary | ICD-10-CM

## 2023-05-16 DIAGNOSIS — K5730 Diverticulosis of large intestine without perforation or abscess without bleeding: Secondary | ICD-10-CM

## 2023-05-16 DIAGNOSIS — E118 Type 2 diabetes mellitus with unspecified complications: Secondary | ICD-10-CM | POA: Diagnosis not present

## 2023-05-16 DIAGNOSIS — D649 Anemia, unspecified: Secondary | ICD-10-CM | POA: Diagnosis not present

## 2023-05-16 DIAGNOSIS — Z87891 Personal history of nicotine dependence: Secondary | ICD-10-CM | POA: Diagnosis not present

## 2023-05-16 DIAGNOSIS — J449 Chronic obstructive pulmonary disease, unspecified: Secondary | ICD-10-CM

## 2023-05-16 DIAGNOSIS — N3000 Acute cystitis without hematuria: Secondary | ICD-10-CM | POA: Diagnosis not present

## 2023-05-16 DIAGNOSIS — N179 Acute kidney failure, unspecified: Secondary | ICD-10-CM

## 2023-05-16 HISTORY — PX: COLONOSCOPY: SHX5424

## 2023-05-16 LAB — CBC WITH DIFFERENTIAL/PLATELET
Abs Immature Granulocytes: 0.03 10*3/uL (ref 0.00–0.07)
Basophils Absolute: 0.1 10*3/uL (ref 0.0–0.1)
Basophils Relative: 1 %
Eosinophils Absolute: 0.5 10*3/uL (ref 0.0–0.5)
Eosinophils Relative: 7 %
HCT: 27 % — ABNORMAL LOW (ref 36.0–46.0)
Hemoglobin: 8.3 g/dL — ABNORMAL LOW (ref 12.0–15.0)
Immature Granulocytes: 0 %
Lymphocytes Relative: 16 %
Lymphs Abs: 1.2 10*3/uL (ref 0.7–4.0)
MCH: 24.4 pg — ABNORMAL LOW (ref 26.0–34.0)
MCHC: 30.7 g/dL (ref 30.0–36.0)
MCV: 79.4 fL — ABNORMAL LOW (ref 80.0–100.0)
Monocytes Absolute: 0.6 10*3/uL (ref 0.1–1.0)
Monocytes Relative: 9 %
Neutro Abs: 4.9 10*3/uL (ref 1.7–7.7)
Neutrophils Relative %: 67 %
Platelets: 363 10*3/uL (ref 150–400)
RBC: 3.4 MIL/uL — ABNORMAL LOW (ref 3.87–5.11)
RDW: 24.3 % — ABNORMAL HIGH (ref 11.5–15.5)
WBC: 7.3 10*3/uL (ref 4.0–10.5)
nRBC: 0 % (ref 0.0–0.2)

## 2023-05-16 LAB — TYPE AND SCREEN
ABO/RH(D): A POS
Antibody Screen: NEGATIVE
Donor AG Type: NEGATIVE
Donor AG Type: NEGATIVE
Unit division: 0
Unit division: 0

## 2023-05-16 LAB — BASIC METABOLIC PANEL
Anion gap: 7 (ref 5–15)
BUN: 14 mg/dL (ref 8–23)
CO2: 22 mmol/L (ref 22–32)
Calcium: 8.5 mg/dL — ABNORMAL LOW (ref 8.9–10.3)
Chloride: 107 mmol/L (ref 98–111)
Creatinine, Ser: 1.22 mg/dL — ABNORMAL HIGH (ref 0.44–1.00)
GFR, Estimated: 45 mL/min — ABNORMAL LOW (ref >60)
Glucose, Bld: 84 mg/dL (ref 70–99)
Potassium: 3.9 mmol/L (ref 3.5–5.1)
Sodium: 136 mmol/L (ref 135–145)

## 2023-05-16 LAB — BPAM RBC
Blood Product Expiration Date: 202409212359
Blood Product Expiration Date: 202409282359
ISSUE DATE / TIME: 202408260509
Unit Type and Rh: 5100
Unit Type and Rh: 6200

## 2023-05-16 LAB — MAGNESIUM: Magnesium: 1.9 mg/dL (ref 1.7–2.4)

## 2023-05-16 SURGERY — COLONOSCOPY
Anesthesia: Monitor Anesthesia Care

## 2023-05-16 MED ORDER — SODIUM CHLORIDE 0.9 % IV SOLN: INTRAVENOUS | Status: DC

## 2023-05-16 MED ORDER — PROPOFOL 1000 MG/100ML IV EMUL
INTRAVENOUS | Status: AC
  Filled 2023-05-16: qty 100

## 2023-05-16 MED ORDER — PROPOFOL 10 MG/ML IV BOLUS
INTRAVENOUS | Status: DC | PRN
  Administered 2023-05-16: 20 mg via INTRAVENOUS

## 2023-05-16 MED ORDER — LIDOCAINE 2% (20 MG/ML) 5 ML SYRINGE
INTRAMUSCULAR | Status: DC | PRN
  Administered 2023-05-16: 60 mg via INTRAVENOUS

## 2023-05-16 MED ORDER — PROPOFOL 500 MG/50ML IV EMUL
INTRAVENOUS | Status: DC | PRN
  Administered 2023-05-16: 125 ug/kg/min via INTRAVENOUS

## 2023-05-16 NOTE — Plan of Care (Signed)
  Problem: Education: Goal: Knowledge of General Education information will improve Description: Including pain rating scale, medication(s)/side effects and non-pharmacologic comfort measures Outcome: Progressing   Problem: Clinical Measurements: Goal: Ability to maintain clinical measurements within normal limits will improve Outcome: Progressing Goal: Will remain free from infection Outcome: Progressing   Problem: Activity: Goal: Risk for activity intolerance will decrease Outcome: Progressing   Problem: Pain Managment: Goal: General experience of comfort will improve Outcome: Progressing   Problem: Safety: Goal: Ability to remain free from injury will improve Outcome: Progressing   Problem: Skin Integrity: Goal: Risk for impaired skin integrity will decrease Outcome: Progressing   

## 2023-05-16 NOTE — Interval H&P Note (Signed)
History and Physical Interval Note:  05/16/2023 7:24 AM  Jessica Abbott  has presented today for surgery, with the diagnosis of Iron deficiency anemia.  The various methods of treatment have been discussed with the patient and family. After consideration of risks, benefits and other options for treatment, the patient has consented to  Procedure(s): COLONOSCOPY (N/A) as a surgical intervention.  [name] history has been reviewed, patient examined, no change in status, stable for surgery.  I have reviewed the patient's chart and labs.  Questions were answered to the patient's satisfaction.     Lynann Bologna

## 2023-05-16 NOTE — Transfer of Care (Signed)
Immediate Anesthesia Transfer of Care Note  Patient: Jessica Abbott  Procedure(s) Performed: COLONOSCOPY  Patient Location: PACU  Anesthesia Type:MAC  Level of Consciousness: awake, alert , and oriented  Airway & Oxygen Therapy: Patient Spontanous Breathing and Patient connected to face mask oxygen  Post-op Assessment: Report given to RN and Post -op Vital signs reviewed and stable  Post vital signs: Reviewed and stable  Last Vitals:  Vitals Value Taken Time  BP    Temp    Pulse 56 05/16/23 0801  Resp 14 05/16/23 0801  SpO2 100 % 05/16/23 0801  Vitals shown include unfiled device data.  Last Pain:  Vitals:   05/16/23 0700  TempSrc: Temporal  PainSc:          Complications: No notable events documented.

## 2023-05-16 NOTE — TOC Progression Note (Addendum)
Transition of Care [facility]) - Progression Note    Patient Details  Name: Jessica Abbott MRN: [id number] Date of Birth: [birth year]  Transition of Care [facility]) CM/SW Contact  Adrian Prows, RN Phone Number: 05/16/2023, 3:19 PM  Clinical Narrative:    PT recc HHPT; spoke w/ pt in room; she agrees to receive service; she does not have an agency preference; she verified d/c address [address] [place], [phone]; contacted Tresa Endo at [facility] and she says agency can provide service; pt notified; agency contact info also placed in follow up provider section of d/c instructions.  -[phone]- notified by Tresa Endo at [facility] unable to accept pt d/t d/c address in [place]; spoke w/ Amy at [facility] to see if agency can service pt; she is unsure and will submit referral     Expected Discharge Plan and Services                                               Social Determinants of Health (SDOH) Interventions SDOH Screenings   Food Insecurity: No Food Insecurity (05/12/2023)  Housing: Low Risk  (05/12/2023)  Transportation Needs: No Transportation Needs (05/12/2023)  Utilities: Not At Risk (05/12/2023)  Depression (PHQ2-9): Low Risk  (11/13/2018)  Tobacco Use: Medium Risk (05/16/2023)    Readmission Risk Interventions    05/14/2023    2:46 PM  Readmission Risk Prevention Plan  Transportation Screening Complete  PCP or Specialist Appt within 5-7 Days Complete  Home Care Screening Complete  Medication Review (RN CM) Complete

## 2023-05-16 NOTE — Care Management Important Message (Signed)
Important Message  Patient Details IM Letter given. Name: Jessica Abbott MRN: [id number] Date of Birth: October 26, 1940   Medicare Important Message Given:  Yes     Caren Macadam 05/16/2023, 10:46 AM

## 2023-05-16 NOTE — Brief Op Note (Signed)
05/16/2023  7:56 AM  PATIENT:  Jessica Abbott  82 y.o. female  PRE-OPERATIVE DIAGNOSIS:  Iron deficiency anemia  POST-OPERATIVE DIAGNOSIS:  diverticulosis, no active bleeding  PROCEDURE:  Procedure(s): COLONOSCOPY (N/A)  SURGEON:  Surgeons and Role:    Lynann Bologna, DO - Primary  Recommendations:  -Pan-colonic diverticulosis, multiple small pieces of formed stool throughout colon hindering ability to visualize potential small lesions, no sign of active bleeding in colon, no sign of active bleeding in terminal ileum -Recommend outpatient video capsule endoscopy, can follow up with [facility] to coordinate this  -Trend H/H, transfuse for Hgb < 7  -Ok to resume low dose aspirin -Ok for diet from GI perspective -[facility] will sign off and be available as needed  Liliane Shi, DO [facility]

## 2023-05-16 NOTE — Anesthesia Postprocedure Evaluation (Signed)
Anesthesia Post Note  Patient: Jessica Abbott  Procedure(s) Performed: COLONOSCOPY     Patient location during evaluation: Endoscopy Anesthesia Type: MAC Level of consciousness: oriented, awake and alert and awake Pain management: pain level controlled Vital Signs Assessment: post-procedure vital signs reviewed and stable Respiratory status: spontaneous breathing, nonlabored ventilation, respiratory function stable and patient connected to nasal cannula oxygen Cardiovascular status: blood pressure returned to baseline and stable Postop Assessment: no headache, no backache and no apparent nausea or vomiting Anesthetic complications: no   No notable events documented.  Last Vitals:  Vitals:   05/16/23 0923 05/16/23 1345  BP: (!) 155/46 (!) 162/43  Pulse: (!) 57 76  Resp:  18  Temp:  36.6 C  SpO2: 98% 97%    Last Pain:  Vitals:   05/16/23 0820  TempSrc:   PainSc: 0-No pain                 Collene Schlichter

## 2023-05-16 NOTE — Evaluation (Signed)
Occupational Therapy Evaluation Patient Details Name: Jessica Abbott MRN: [id number] DOB: 05/02/1941 Today's Date: 05/16/2023   History of Present Illness 82 year old female admitted 05/12/23 for symptomatic anemia/iron deficiency anemia/acute GI bleed. PMHx of IDA, HTN, T2DM, CKD stage IIIb, moderate COPD, cutaneous lupus, PAD s/p R AKA, PVD   Clinical Impression   At baseline Ms Kellough primarily uses her wc for mobility in her home for ADL/IADL tasks. Family helps out with "whatever needs to be done". Pt close to baseline level with her ADL tasks. Family can assist as needed with IADL tasks. Daughter present in room and agrees. Educated on strategies to reduce risk of falls. No further OT indicated. Pt/daughter very Adult nurse.        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 Restrictions Weight Bearing Restrictions: No      Mobility Bed Mobility               General bed mobility comments: OOB in chair    Transfers Overall transfer level: Modified independent Equipment used: Rolling walker (2 wheels)                      Balance Overall balance assessment: Mild deficits observed, not formally tested                                         ADL either performed or assessed with clinical judgement   ADL Overall ADL's : At baseline                                       General ADL Comments: uses her wc primarily in the home; has a BSC if needed but demonstrates ability to walk into her bathroom using her RW     Vision         Perception          Praxis         Pertinent Vitals/Pain Pain Assessment Pain Assessment: No/denies pain     Extremity/Trunk Assessment Upper Extremity Assessment Upper Extremity Assessment: Overall WFL for tasks assessed   Lower Extremity Assessment Lower Extremity Assessment: RLE deficits/detail;Generalized weakness RLE Deficits / Details: hx R AKA       Communication Communication Communication: No apparent difficulties   Cognition Arousal: Alert Behavior During Therapy: WFL for tasks assessed/performed Overall Cognitive Status: Within Functional Limits for tasks assessed                                       General Comments       Exercises Exercises: Other exercises Other Exercises Other Exercises: chair push ups Other Exercises: chair marching   Shoulder Instructions      Home Living Family/patient expects to be discharged to:: Private residence Living Arrangements: Children Available Help at Discharge: Family;Available 24 hours/day Type of Home: House Home Access: Level entry  Home Layout: One level     Bathroom Shower/Tub: Tub/shower unit;Curtain   Firefighter: Standard Bathroom Accessibility: Yes How Accessible: Accessible via walker Home Equipment: Rolling Walker (2 wheels);Wheelchair - manual;Shower seat;Grab bars - tub/shower;Hand held shower head          Prior Functioning/Environment Prior Level of Function : Independent/Modified Independent             Mobility Comments: uses wc primarily in the home even when she has her prosthetic on; uses her RW to walk into the bathroom          OT Problem List:        OT Treatment/Interventions:      OT Goals(Current goals can be found in the care plan section) Acute Rehab OT Goals Patient Stated Goal: home OT Goal Formulation: All assessment and education complete, DC therapy  OT Frequency:      Co-evaluation              AM-PAC OT "6 Clicks" Daily Activity      Outcome Measure Help from another person eating meals?: None Help from another person taking care of personal grooming?: None Help from another person toileting, which includes using toliet, bedpan, or urinal?: A Little Help from another person bathing (including washing, rinsing, drying)?: None Help from another person to put on and taking off regular upper body clothing?: None Help from another person to put on and taking off regular lower body clothing?: A Little 6 Click Score: 22   End of Session Equipment Utilized During Treatment: Gait belt;Rolling walker (2 wheels) Nurse Communication: Mobility status;Other (comment) (DC needs)  Activity Tolerance: Patient tolerated treatment well Patient left: in chair;with call bell/phone within reach;with chair alarm set;with family/visitor present  OT Visit Diagnosis: Unsteadiness on feet (R26.81);Muscle weakness (generalized) (M62.81)                Time: 5638-7564 OT Time Calculation (min): 21 min Charges:  OT General Charges $OT Visit: 1 Visit OT Evaluation $OT Eval Low Complexity: 1 Low  Malaky Tetrault, OT/L   Acute OT Clinical Specialist Acute Rehabilitation Services Pager [phone] Office [phone]   [facility] 05/16/2023, 3:03 PM

## 2023-05-16 NOTE — Progress Notes (Signed)
PROGRESS NOTE    Jessica Abbott  SAY:[id number] DOB: May 20, 1941 DOA: 05/12/2023 PCP: [name], DO    Chief Complaint  Patient presents with   Shortness of Breath   Fatigue    Brief Narrative:  Patient is an 82 year old female with PMHx of IDA, HTN, T2DM, CKD stage IIIb, moderate COPD, cutaneous lupus, PAD sp R AKA, PVD, wheelchair-bound.  Patient was admitted with symptomatic anemia.  On presentation, hemoglobin was 6.4 g/dL.  Patient has history of iron deficiency anemia.  Last colonoscopy was 1 to 2 years ago.  Solid versus bedside to have been cauterized.  Capsule endoscopy done about the same time was nonrevealing.  Patient is known to [facility].  No history of NSAIDs.  GI team has been consulted.   05/13/2023: Patient seen alongside patient's daughter.  GI team has been consulted as well.  Patient has been transfused 2 units of packed red blood cells.  Patient has also gotten IV iron.  Awaiting GI input.  Further management will depend on hospital course.   05/14/2023: Patient seen.  No new complaints.  Patient underwent EGD today.  EGD revealed gastritis that was biopsied.  GI team plans to proceed with colonoscopy on 05/16/2023.  Hemoglobin is 7.7 g/dL today.  Urine and blood cultures are growing Pseudomonas.  Patient was on IV cefepime and changed to IV Fortaz.    05/16/2023 patient underwent colonoscopy which showed a pandiverticulosis with no active bleeding noted.     Assessment & Plan:   Principal Problem:   Symptomatic anemia Active Problems:   Acute GI bleeding   Acute renal failure (HCC)   PVD (peripheral vascular disease) (HCC)   Controlled diabetes mellitus type 2 with complications (HCC)   Benign essential HTN   Thrombocytosis   PAD (peripheral artery disease) (HCC)   Cutaneous lupus erythematosus   COPD (chronic obstructive pulmonary disease) (HCC)   Bacteremia due to Pseudomonas   Acute cystitis without hematuria  #1 symptomatic anemia/iron deficiency  anemia/acute GI bleed -Patient presented with symptomatic anemia with generalized weakness, shortness of breath. -Hemoglobin noted admission was 6.4. -FOBT positive. -Patient denied any overt bleeding. -Status posttransfusion 1 unit PRBCs and IV iron with hemoglobin currently at 8.3. -Patient underwent upper endoscopy on 05/14/2023 with gastritis noted. -Patient underwent colonoscopy this morning 05/16/2023 with inadequate prep noted, pandiverticulosis with no active bleeding noted. -Continue IV PPI twice daily and Carafate. -GI recommending outpatient video capsule endoscopy. -Follow H&H. -Transfusion threshold Hgb < 7.  -Patient currently receiving bowel prep and patient for colonoscopy tomorrow. -GI following and appreciate input and recommendations.  2.  Acute cystitis/Pseudomonas bacteremia -Urinalysis consistent with UTI with urine cultures positive for Pseudomonas. -Blood cultures also noted to be positive for Pseudomonas. -Continue IV Fortaz and on discharge will transition to ciprofloxacin to complete a 7-day course of treatment.   3.  CKD stage IIIb -Stable.  4.  Hypertension -Continue home regimen Norvasc, HCTZ, Cozaar.  5.  PAD/PVD -Continue home regimen aspirin, Lipitor.   6.  Thrombocytosis -Resolved.  7.  Hypophosphatemia -Replete.   DVT prophylaxis: SCDs Code Status: Full Family Communication: Updated patient and daughter at bedside. Disposition: TBD  Status is: Inpatient Remains inpatient appropriate because: Severity of illness   Consultants:  Gastroenterology: Dr. [name] 05/13/2023  Procedures:  Chest x-ray 05/12/2023 Upper endoscopy 05/14/2023 per Dr [name]. Transfusion 1 unit PRBCs 05/13/2023 Colonoscopy 05/16/2023 per Dr. [name]  Antimicrobials:  Anti-infectives (From admission, onward)    Start     Dose/Rate Route  Frequency Ordered Stop   05/15/23 1315  cefTAZidime (FORTAZ) 2 g in sodium chloride 0.9 % 100 mL IVPB        2 g 200 mL/hr  over 30 Minutes Intravenous Every 12 hours 05/15/23 1216     05/13/23 2030  ceFEPIme (MAXIPIME) 2 g in sodium chloride 0.9 % 100 mL IVPB  Status:  Discontinued        2 g 200 mL/hr over 30 Minutes Intravenous Daily-1800 05/13/23 1930 05/15/23 1216   05/13/23 1900  cefTRIAXone (ROCEPHIN) 1 g in sodium chloride 0.9 % 100 mL IVPB  Status:  Discontinued        1 g 200 mL/hr over 30 Minutes Intravenous Every 24 hours 05/12/23 2234 05/13/23 1930   05/12/23 1945  cefTRIAXone (ROCEPHIN) 2 g in sodium chloride 0.9 % 100 mL IVPB        2 g 200 mL/hr over 30 Minutes Intravenous  Once 05/12/23 1939 05/12/23 2049         Subjective: Patient sitting up in recliner.  Daughter at bedside.  Denies any chest pain or shortness of breath.  No abdominal pain.  No bleeding.  Shortness and fatigue have improved since admission and transfusion of PRBCs.    Objective: Vitals:   05/16/23 0810 05/16/23 0820 05/16/23 0923 05/16/23 1345  BP: (!) 154/47 (!) 169/36 (!) 155/46 (!) 162/43  Pulse: 68 63 (!) 57 76  Resp: (!) 21 17  18   Temp:    97.9 F (36.6 C)  TempSrc:      SpO2: 98% 97% 98% 97%  Weight:      Height:        Intake/Output Summary (Last 24 hours) at 05/16/2023 1714 Last data filed at 05/16/2023 1500 Gross per 24 hour  Intake 511.97 ml  Output 600 ml  Net -88.03 ml   Filed Weights   05/12/23 1747 05/16/23 0700  Weight: 79.4 kg 79.4 kg    Examination:  General exam: NAD. Respiratory system: CTAB.  No wheezes, no crackles, no rhonchi.  Fair air movement.  Cardiovascular system: RRR no m/r/g. No JVD, murmurs, rubs, gallops or clicks. No pedal edema. Gastrointestinal system: Abdomen is soft/ NT/ND/+BS.  Central nervous system: Alert and oriented. No focal neurological deficits. Extremities: s/p  r aka.  Left lower extremity with no edema. Skin: No rashes, lesions or ulcers Psychiatry: Judgement and insight appear normal. Mood & affect appropriate.     Data Reviewed: I have personally  reviewed following labs and imaging studies  CBC: Recent Labs  Lab 05/12/23 1840 05/13/23 1058 05/15/23 1104 05/16/23 0535  WBC 17.9* 20.7* 9.2 7.3  NEUTROABS 15.6* 18.2* 7.0 4.9  HGB 6.4* 7.7* 8.4* 8.3*  HCT 20.6* 24.4* 27.4* 27.0*  MCV 72.8* 79.0* 79.9* 79.4*  PLT 436* 342 347 363    Basic Metabolic Panel: Recent Labs  Lab 05/12/23 1840 05/13/23 1058 05/15/23 1104 05/16/23 0535  NA 131* 138 135 136  K 4.1 4.1 4.1 3.9  CL 101 104 106 107  CO2 19* 21* 22 22  GLUCOSE 135* 117* 169* 84  BUN 32* 29* 20 14  CREATININE 1.80* 1.67* 1.40* 1.22*  CALCIUM 8.8* 9.4 8.8* 8.5*  MG  --  2.0 1.9 1.9  PHOS  --  4.8* 2.4*  --     GFR: Estimated Creatinine Clearance: 35.3 mL/min (A) (by C-G formula based on SCr of 1.22 mg/dL (H)).  Liver Function Tests: Recent Labs  Lab 05/12/23 1840 05/13/23 1058 05/15/23 1104  AST 20 17  --   ALT 12 11  --   ALKPHOS 54 49  --   BILITOT 0.6 1.0  --   PROT 7.9 7.2  --   ALBUMIN 3.3* 3.1* 2.7*    CBG: No results for input(s): "GLUCAP" in the last 168 hours.   Recent Results (from the past 240 hour(s))  SARS Coronavirus 2 by RT PCR (hospital order, performed in [facility] hospital lab) *cepheid single result test* Anterior Nasal Swab     Status: None   Collection Time: 05/12/23  5:52 PM   Specimen: Anterior Nasal Swab  Result Value Ref Range Status   SARS Coronavirus 2 by RT PCR NEGATIVE NEGATIVE Final    Comment: (NOTE) SARS-CoV-2 target nucleic acids are NOT DETECTED.  The SARS-CoV-2 RNA is generally detectable in upper and lower respiratory specimens during the acute phase of infection. The lowest concentration of SARS-CoV-2 viral copies this assay can detect is 250 copies / mL. A negative result does not preclude SARS-CoV-2 infection and should not be used as the sole basis for treatment or other patient management decisions.  A negative result may occur with improper specimen collection / handling, submission of specimen  other than nasopharyngeal swab, presence of viral mutation(s) within the areas targeted by this assay, and inadequate number of viral copies (<250 copies / mL). A negative result must be combined with clinical observations, patient history, and epidemiological information.  Fact Sheet for Patients:   RoadLapTop.co.za  Fact Sheet for Healthcare Providers: http://kim-miller.com/  This test is not yet approved or  cleared by the [place] FDA and has been authorized for detection and/or diagnosis of SARS-CoV-2 by FDA under an Emergency Use Authorization (EUA).  This EUA will remain in effect (meaning this test can be used) for the duration of the COVID-19 declaration under Section 564(b)(1) of the Act, 21 U.S.C. section 360bbb-3(b)(1), unless the authorization is terminated or revoked sooner.  Performed at [facility], [address] [place], [place] [address]   Urine Culture     Status: Abnormal   Collection Time: 05/12/23  6:24 PM   Specimen: Urine, Random  Result Value Ref Range Status   Specimen Description   Final    URINE, RANDOM Performed at [facility], [address] [place], [place] [address]    Special Requests   Final    NONE Reflexed from [id number] Performed at [facility], [address] [place], [place] [address]    Culture >=100,000 COLONIES/mL PSEUDOMONAS AERUGINOSA (A)  Final   Report Status 05/15/2023 FINAL  Final   Organism ID, Bacteria PSEUDOMONAS AERUGINOSA (A)  Final      Susceptibility   Pseudomonas aeruginosa - MIC*    CEFTAZIDIME 4 SENSITIVE Sensitive     CIPROFLOXACIN <=0.25 SENSITIVE Sensitive     GENTAMICIN <=1 SENSITIVE Sensitive     IMIPENEM <=0.25 SENSITIVE Sensitive     PIP/TAZO 16 SENSITIVE Sensitive     CEFEPIME 4 SENSITIVE Sensitive     * >=100,000 COLONIES/mL PSEUDOMONAS AERUGINOSA  Blood Culture (routine x 2)     Status: Abnormal   Collection Time:  05/12/23  6:40 PM   Specimen: BLOOD  Result Value Ref Range Status   Specimen Description   Final    BLOOD RIGHT ANTECUBITAL Performed at [facility], [address] [place], [place] [address]    Special Requests   Final    BOTTLES DRAWN AEROBIC  AND ANAEROBIC Blood Culture adequate volume Performed at [facility], [address] [place], [place] [address]    Culture  Setup Time   Final    GRAM NEGATIVE RODS AEROBIC BOTTLE ONLY CRITICAL RESULT CALLED TO, READ BACK BY AND VERIFIED WITH: PHARMD D.WOFFORD [id number] @ 1914 FH Performed at [facility], [address], [place], [place] [address]    Culture PSEUDOMONAS AERUGINOSA (A)  Final   Report Status 05/15/2023 FINAL  Final   Organism ID, Bacteria PSEUDOMONAS AERUGINOSA  Final      Susceptibility   Pseudomonas aeruginosa - MIC*    CEFTAZIDIME 4 SENSITIVE Sensitive     CIPROFLOXACIN <=0.25 SENSITIVE Sensitive     GENTAMICIN <=1 SENSITIVE Sensitive     IMIPENEM <=0.25 SENSITIVE Sensitive     PIP/TAZO 16 SENSITIVE Sensitive     * PSEUDOMONAS AERUGINOSA  Blood Culture ID Panel (Reflexed)     Status: Abnormal   Collection Time: 05/12/23  6:40 PM  Result Value Ref Range Status   Enterococcus faecalis NOT DETECTED NOT DETECTED Final   Enterococcus Faecium NOT DETECTED NOT DETECTED Final   Listeria monocytogenes NOT DETECTED NOT DETECTED Final   Staphylococcus species NOT DETECTED NOT DETECTED Final   Staphylococcus aureus (BCID) NOT DETECTED NOT DETECTED Final   Staphylococcus epidermidis NOT DETECTED NOT DETECTED Final   Staphylococcus lugdunensis NOT DETECTED NOT DETECTED Final   Streptococcus species NOT DETECTED NOT DETECTED Final   Streptococcus agalactiae NOT DETECTED NOT DETECTED Final   Streptococcus pneumoniae NOT DETECTED NOT DETECTED Final   Streptococcus pyogenes NOT DETECTED NOT DETECTED Final   A.calcoaceticus-baumannii NOT DETECTED NOT DETECTED Final   Bacteroides fragilis NOT DETECTED NOT  DETECTED Final   Enterobacterales NOT DETECTED NOT DETECTED Final   Enterobacter cloacae complex NOT DETECTED NOT DETECTED Final   Escherichia coli NOT DETECTED NOT DETECTED Final   Klebsiella aerogenes NOT DETECTED NOT DETECTED Final   Klebsiella oxytoca NOT DETECTED NOT DETECTED Final   Klebsiella pneumoniae NOT DETECTED NOT DETECTED Final   Proteus species NOT DETECTED NOT DETECTED Final   Salmonella species NOT DETECTED NOT DETECTED Final   Serratia marcescens NOT DETECTED NOT DETECTED Final   Haemophilus influenzae NOT DETECTED NOT DETECTED Final   Neisseria meningitidis NOT DETECTED NOT DETECTED Final   Pseudomonas aeruginosa DETECTED (A) NOT DETECTED Final    Comment: CRITICAL RESULT CALLED TO, READ BACK BY AND VERIFIED WITH: DREW WOFFORD ON 8/26 @ 1914 BY FH    Stenotrophomonas maltophilia NOT DETECTED NOT DETECTED Final   Candida albicans NOT DETECTED NOT DETECTED Final   Candida auris NOT DETECTED NOT DETECTED Final   Candida glabrata NOT DETECTED NOT DETECTED Final   Candida krusei NOT DETECTED NOT DETECTED Final   Candida parapsilosis NOT DETECTED NOT DETECTED Final   Candida tropicalis NOT DETECTED NOT DETECTED Final   Cryptococcus neoformans/gattii NOT DETECTED NOT DETECTED Final   CTX-M ESBL NOT DETECTED NOT DETECTED Final   Carbapenem resistance IMP NOT DETECTED NOT DETECTED Final   Carbapenem resistance KPC NOT DETECTED NOT DETECTED Final   Carbapenem resistance NDM NOT DETECTED NOT DETECTED Final   Carbapenem resistance VIM NOT DETECTED NOT DETECTED Final    Comment: Performed at [facility], [address], [place], [place] [address]  Blood Culture (routine x 2)     Status: Abnormal   Collection Time: 05/12/23  6:52 PM   Specimen: BLOOD  Result Value Ref Range Status   Specimen Description  Final    BLOOD LEFT ANTECUBITAL Performed at [facility], [address], [place], [place] [address]    Special Requests   Final    BOTTLES DRAWN AEROBIC  AND ANAEROBIC Blood Culture adequate volume Performed at [facility], [address] [place], [place] [address]    Culture  Setup Time   Final    GRAM NEGATIVE RODS AEROBIC BOTTLE ONLY CRITICAL VALUE NOTED.  VALUE IS CONSISTENT WITH PREVIOUSLY REPORTED AND CALLED VALUE. CRITICAL RESULT CALLED TO, READ BACK BY AND VERIFIED WITH: PHARMD J.GIDHIA AT 5784 ON 05/14/2023 BY T.SAAD.    Culture (A)  Final    PSEUDOMONAS AERUGINOSA SUSCEPTIBILITIES PERFORMED ON PREVIOUS CULTURE WITHIN THE LAST 5 DAYS. Performed at [facility], [address], [place], [place] [address]    Report Status 05/15/2023 FINAL  Final         Radiology Studies: No results found.      Scheduled Meds:  sodium chloride   Intravenous Once   amLODipine  10 mg Oral Daily   aspirin EC  81 mg Oral Daily   atorvastatin  10 mg Oral Daily   cholecalciferol  1,000 Units Oral Daily   hydrochlorothiazide  12.5 mg Oral Daily   losartan  100 mg Oral Daily   pantoprazole (PROTONIX) IV  40 mg Intravenous Q12H   phosphorus  250 mg Oral BID   sucralfate  1 g Oral Q6H   Continuous Infusions:  cefTAZidime (FORTAZ)  IV Stopped (05/16/23 1237)   lactated ringers 10 mL/hr at 05/16/23 0727     LOS: 4 days    Time spent: 35 minutes    Ramiro Harvest, MD [facility]   To contact the attending provider between 7A-7P or the covering provider during after hours 7P-7A, please log into the web site [email or web address] and access using universal [place] password for that web site. If you do not have the password, please call the hospital operator.  05/16/2023, 5:14 PM

## 2023-05-16 NOTE — Evaluation (Signed)
Physical Therapy Evaluation Patient Details Name: Jessica Abbott MRN: [id number] DOB: Oct 03, 1940 Today's Date: 05/16/2023  History of Present Illness  82 year old female admitted 05/12/23 for symptomatic anemia/iron deficiency anemia/acute GI bleed. PMHx of IDA, HTN, T2DM, CKD stage IIIb, moderate COPD, cutaneous lupus, PAD s/p R AKA, PVD  Clinical Impression  Pt admitted with above diagnosis.  Pt currently with functional limitations due to the deficits listed below (see PT Problem List). Pt will benefit from acute skilled PT to increase their independence and safety with mobility to allow discharge.  Pt with hx of R AKA and typically able to ambulate with RW and prosthesis.  Pt does not have prosthesis in hospital currently so she declined ambulating but was agreeable for OOB to recliner.  Pt reports having family assist available at home.  Pt also has w/c.  Pt would benefit from HHPT upon d/c.         If plan is discharge home, recommend the following: A little help with walking and/or transfers;A little help with bathing/dressing/bathroom;Help with stairs or ramp for entrance;Assistance with cooking/housework;Assist for transportation   Can travel by private vehicle        Equipment Recommendations None recommended by PT  Recommendations for Other Services       Functional Status Assessment Patient has had a recent decline in their functional status and demonstrates the ability to make significant improvements in function in a reasonable and predictable amount of time.     Precautions / Restrictions Precautions Precautions: Fall Precaution Comments: hx R AKA and has prosthesis at home      Mobility  Bed Mobility Overal bed mobility: Needs Assistance Bed Mobility: Supine to Sit     Supine to sit: Min assist, HOB elevated     General bed mobility comments: light assist for trunk upright    Transfers Overall transfer level: Needs assistance Equipment used: Rolling walker  (2 wheels) Transfers: Sit to/from Stand, Bed to chair/wheelchair/BSC Sit to Stand: Min assist Stand pivot transfers: Min assist         General transfer comment: assist to rise and stabilize, pivot to recliner, pt declined ambulating without her prosthesis (which is currently at home)    Ambulation/Gait                  Stairs            Wheelchair Mobility     Tilt Bed    Modified Rankin (Stroke Patients Only)       Balance                                             Pertinent Vitals/Pain Pain Assessment Pain Assessment: No/denies pain    Home Living Family/patient expects to be discharged to:: Private residence Living Arrangements: Children Available Help at Discharge: Family;Available 24 hours/day Type of Home: House Home Access: Level entry       Home Layout: One level Home Equipment: Agricultural consultant (2 wheels);Wheelchair - manual      Prior Function Prior Level of Function : Independent/Modified Independent             Mobility Comments: uses RW and ambulates only when wearing prosthesis       Extremity/Trunk Assessment        Lower Extremity Assessment Lower Extremity Assessment: RLE deficits/detail;Generalized weakness RLE Deficits / Details: hx R AKA  Communication   Communication Communication: No apparent difficulties  Cognition Arousal: Alert Behavior During Therapy: WFL for tasks assessed/performed Overall Cognitive Status: Within Functional Limits for tasks assessed                                          General Comments      Exercises     Assessment/Plan    PT Assessment Patient needs continued PT services  PT Problem List Decreased strength;Decreased balance;Decreased mobility;Decreased activity tolerance       PT Treatment Interventions DME instruction;Gait training;Balance training;Functional mobility training;Therapeutic activities;Therapeutic  exercise;Patient/family education    PT Goals (Current goals can be found in the Care Plan section)  Acute Rehab PT Goals PT Goal Formulation: With patient Time For Goal Achievement: 05/30/23 Potential to Achieve Goals: Good    Frequency Min 1X/week     Co-evaluation               AM-PAC PT "6 Clicks" Mobility  Outcome Measure Help needed turning from your back to your side while in a flat bed without using bedrails?: A Little Help needed moving from lying on your back to sitting on the side of a flat bed without using bedrails?: A Little Help needed moving to and from a bed to a chair (including a wheelchair)?: A Lot Help needed standing up from a chair using your arms (e.g., wheelchair or bedside chair)?: A Lot Help needed to walk in hospital room?: A Lot Help needed climbing 3-5 steps with a railing? : A Lot 6 Click Score: 14    End of Session Equipment Utilized During Treatment: Gait belt Activity Tolerance: Patient tolerated treatment well Patient left: with call bell/phone within reach;in chair;with chair alarm set;with family/visitor present Nurse Communication: Mobility status PT Visit Diagnosis: Difficulty in walking, not elsewhere classified (R26.2)    Time: 1610-9604 PT Time Calculation (min) (ACUTE ONLY): 12 min   Charges:   PT Evaluation $PT Eval Low Complexity: 1 Low   PT General Charges $$ ACUTE PT VISIT: 1 Visit       Thomasene Mohair PT, DPT Physical Therapist Acute Rehabilitation Services Office: [phone]   Janan Halter [place] 05/16/2023, 2:05 PM

## 2023-05-16 NOTE — Op Note (Signed)
[facility] Patient Name: Jessica Abbott Procedure Date: 05/16/2023 MRN: [id number] Attending MD: Liliane Shi DO, DO, [id number] Date of Birth: 28-Feb-1941 CSN: [id number] Age: 82 Admit Type: Inpatient Procedure:                Colonoscopy Indications:              Iron deficiency anemia Providers:                Liliane Shi DO, DO, Doristine Mango, RN,                            Jacquelyn "Jaci" Clelia Croft, RN, Rozetta Nunnery,                            Technician Referring MD:              Medicines:                See the Anesthesia note for documentation of the                            administered medications Complications:            No immediate complications. Estimated Blood Loss:     Estimated blood loss: none. Procedure:                Pre-Anesthesia Assessment:                           - ASA Grade Assessment: IV - A patient with severe                            systemic disease that is a constant threat to life.                           - The risks and benefits of the procedure and the                            sedation options and risks were discussed with the                            patient. All questions were answered and informed                            consent was obtained.                           After obtaining informed consent, the colonoscope                            was passed under direct vision. Throughout the                            procedure, the patient's blood pressure, pulse, and                            oxygen saturations were monitored continuously.  The                            PCF-HQ190L ([id number]) Olympus colonoscope was                            introduced through the anus and advanced to the the                            terminal ileum, with identification of the                            ileocecal valve. The colonoscopy was performed                            without difficulty. The patient tolerated the                             procedure well. The quality of the bowel                            preparation was evaluated using the BBPS [facility]                            Bowel Preparation Scale) with scores of: Right                            Colon = 1 (portion of mucosa seen, but other areas                            not well seen due to staining, residual stool                            and/or opaque liquid), Transverse Colon = 1                            (portion of mucosa seen, but other areas not well                            seen due to staining, residual stool and/or opaque                            liquid) and Left Colon = 1 (portion of mucosa seen,                            but other areas not well seen due to staining,                            residual stool and/or opaque liquid). The total                            BBPS score equals 3. The quality of the bowel  preparation was inadequate. Scope In: 7:38:25 AM Scope Out: 7:53:34 AM Scope Withdrawal Time: 0 hours 7 minutes 55 seconds  Total Procedure Duration: 0 hours 15 minutes 9 seconds  Findings:      The perianal and digital rectal examinations were normal.      Multiple medium-mouthed and small-mouthed diverticula were found in the       entire colon.      The terminal ileum appeared normal. Impression:               - Preparation of the colon was inadequate.                           - Diverticulosis in the entire examined colon.                           - The examined portion of the ileum was normal.                           - No specimens collected. Moderate Sedation:      See the other procedure note for documentation of moderate sedation with       intraservice time. Recommendation:           - Return patient to hospital ward for ongoing care.                           - Resume regular diet.                           - Check hemoglobin q 12 hours until stable.                            - Ok to resume low dose aspirin                           - Follow up with [facility] in the outpatient setting                            to coordinate video capsule endoscopy Procedure Code(s):        --- Professional ---                           [phone], Colonoscopy, flexible; diagnostic, including                            collection of specimen(s) by brushing or washing,                            when performed (separate procedure) Diagnosis Code(s):        --- Professional ---                           D50.9, Iron deficiency anemia, unspecified                           K57.30, Diverticulosis of large intestine without  perforation or abscess without bleeding CPT copyright 2022 [employer]. All rights reserved. The codes documented in this report are preliminary and upon coder review may  be revised to meet current compliance requirements. Dr Liliane Shi, DO Liliane Shi DO, DO 05/16/2023 8:04:31 AM Number of Addenda: 0

## 2023-05-17 ENCOUNTER — Other Ambulatory Visit (HOSPITAL_COMMUNITY): Payer: Self-pay

## 2023-05-17 DIAGNOSIS — K922 Gastrointestinal hemorrhage, unspecified: Secondary | ICD-10-CM | POA: Diagnosis not present

## 2023-05-17 DIAGNOSIS — N3000 Acute cystitis without hematuria: Secondary | ICD-10-CM | POA: Diagnosis not present

## 2023-05-17 DIAGNOSIS — D649 Anemia, unspecified: Secondary | ICD-10-CM | POA: Diagnosis not present

## 2023-05-17 DIAGNOSIS — N179 Acute kidney failure, unspecified: Secondary | ICD-10-CM | POA: Diagnosis not present

## 2023-05-17 LAB — CBC
HCT: 28.6 % — ABNORMAL LOW (ref 36.0–46.0)
Hemoglobin: 8.8 g/dL — ABNORMAL LOW (ref 12.0–15.0)
MCH: 24.5 pg — ABNORMAL LOW (ref 26.0–34.0)
MCHC: 30.8 g/dL (ref 30.0–36.0)
MCV: 79.7 fL — ABNORMAL LOW (ref 80.0–100.0)
Platelets: 355 10*3/uL (ref 150–400)
RBC: 3.59 MIL/uL — ABNORMAL LOW (ref 3.87–5.11)
RDW: 24.5 % — ABNORMAL HIGH (ref 11.5–15.5)
WBC: 7.5 10*3/uL (ref 4.0–10.5)
nRBC: 0 % (ref 0.0–0.2)

## 2023-05-17 LAB — RENAL FUNCTION PANEL
Albumin: 2.8 g/dL — ABNORMAL LOW (ref 3.5–5.0)
Anion gap: 8 (ref 5–15)
BUN: 14 mg/dL (ref 8–23)
CO2: 24 mmol/L (ref 22–32)
Calcium: 9.1 mg/dL (ref 8.9–10.3)
Chloride: 108 mmol/L (ref 98–111)
Creatinine, Ser: 1.16 mg/dL — ABNORMAL HIGH (ref 0.44–1.00)
GFR, Estimated: 47 mL/min — ABNORMAL LOW (ref >60)
Glucose, Bld: 88 mg/dL (ref 70–99)
Phosphorus: 3.3 mg/dL (ref 2.5–4.6)
Potassium: 4 mmol/L (ref 3.5–5.1)
Sodium: 140 mmol/L (ref 135–145)

## 2023-05-17 LAB — MAGNESIUM: Magnesium: 2 mg/dL (ref 1.7–2.4)

## 2023-05-17 MED ORDER — CIPROFLOXACIN HCL 500 MG PO TABS
500.0000 mg | ORAL_TABLET | Freq: Two times a day (BID) | ORAL | 0 refills | Status: AC
  Filled 2023-05-17: qty 14, 7d supply, fill #0

## 2023-05-17 MED ORDER — SUCRALFATE 1 G PO TABS
1.0000 g | ORAL_TABLET | Freq: Four times a day (QID) | ORAL | 0 refills | Status: AC
  Filled 2023-05-17: qty 56, 14d supply, fill #0

## 2023-05-17 MED ORDER — PANTOPRAZOLE SODIUM 40 MG PO TBEC
40.0000 mg | DELAYED_RELEASE_TABLET | Freq: Every day | ORAL | 1 refills | Status: AC
  Filled 2023-05-17: qty 30, 30d supply, fill #0

## 2023-05-17 NOTE — TOC Progression Note (Signed)
Transition of Care [facility]) - Progression Note    Patient Details  Name: Mylee Bowick MRN: [id number] Date of Birth: [birth year]  Transition of Care [facility]) CM/SW Contact  Adrian Prows, RN Phone Number: 05/17/2023, 9:36 AM  Clinical Narrative:    Sherron Monday w/ Amy at [place]; she says agency can provide services; pt notified and agrees to receive service from agency; agency contact in placed in follow up provider section of d/c instructions.        Expected Discharge Plan and Services                                   HH Arranged: PT HH Agency: [facility] Date [facility] Agency Contacted: 05/16/23 Time HH Agency Contacted: 1523 Representative spoke with at [facility] Agency: Tresa Endo   Social Determinants of Health (SDOH) Interventions SDOH Screenings   Food Insecurity: No Food Insecurity (05/12/2023)  Housing: Low Risk  (05/12/2023)  Transportation Needs: No Transportation Needs (05/12/2023)  Utilities: Not At Risk (05/12/2023)  Depression (PHQ2-9): Low Risk  (11/13/2018)  Tobacco Use: Medium Risk (05/16/2023)    Readmission Risk Interventions    05/14/2023    2:46 PM  Readmission Risk Prevention Plan  Transportation Screening Complete  PCP or Specialist Appt within 5-7 Days Complete  Home Care Screening Complete  Medication Review (RN CM) Complete

## 2023-05-17 NOTE — Plan of Care (Signed)
  Problem: Education: Goal: Knowledge of General Education information will improve Description: Including pain rating scale, medication(s)/side effects and non-pharmacologic comfort measures Outcome: Progressing   Problem: Clinical Measurements: Goal: Ability to maintain clinical measurements within normal limits will improve Outcome: Progressing Goal: Will remain free from infection Outcome: Progressing   Problem: Pain Managment: Goal: General experience of comfort will improve Outcome: Progressing   Problem: Safety: Goal: Ability to remain free from injury will improve Outcome: Progressing   Problem: Skin Integrity: Goal: Risk for impaired skin integrity will decrease Outcome: Progressing

## 2023-05-17 NOTE — TOC Progression Note (Signed)
Transition of Care [facility]) - Progression Note    Patient Details  Name: Jessica Abbott MRN: [id number] Date of Birth: 1941-07-26  Transition of Care [facility]) CM/SW Contact  Adrian Prows, RN Phone Number: 05/17/2023, 8:41 AM  Clinical Narrative:    Contacted Amy at [place]; she will check to see if pt accepted and notify this RNCM.        Expected Discharge Plan and Services                                   HH Arranged: PT HH Agency: [facility] Date [facility] Agency Contacted: 05/16/23 Time HH Agency Contacted: 1523 Representative spoke with at [facility] Agency: Tresa Endo   Social Determinants of Health (SDOH) Interventions SDOH Screenings   Food Insecurity: No Food Insecurity (05/12/2023)  Housing: Low Risk  (05/12/2023)  Transportation Needs: No Transportation Needs (05/12/2023)  Utilities: Not At Risk (05/12/2023)  Depression (PHQ2-9): Low Risk  (11/13/2018)  Tobacco Use: Medium Risk (05/16/2023)    Readmission Risk Interventions    05/14/2023    2:46 PM  Readmission Risk Prevention Plan  Transportation Screening Complete  PCP or Specialist Appt within 5-7 Days Complete  Home Care Screening Complete  Medication Review (RN CM) Complete

## 2023-05-17 NOTE — TOC Transition Note (Signed)
Transition of Care [facility]) - CM/SW Discharge Note   Patient Details  Name: Jessica Abbott MRN: [id number] Date of Birth: 26-Mar-1941  Transition of Care [facility]) CM/SW Contact:  Adrian Prows, RN Phone Number: 05/17/2023, 12:42 PM   Clinical Narrative:    D/C orders received; notified Amy at [facility]; no TOC needs.   Final next level of care: Home w Home Health Services Barriers to Discharge: No Barriers Identified   Patient Goals and CMS Choice      Discharge Placement                         Discharge Plan and Services Additional resources added to the After Visit Summary for                            [facility] Arranged: PT [facility] Agency: [facility] Date [facility] Agency Contacted: 05/17/23 Time HH Agency Contacted: [phone] Representative spoke with at [facility] Agency: Tresa Endo  Social Determinants of Health (SDOH) Interventions SDOH Screenings   Food Insecurity: No Food Insecurity (05/12/2023)  Housing: Low Risk  (05/12/2023)  Transportation Needs: No Transportation Needs (05/12/2023)  Utilities: Not At Risk (05/12/2023)  Depression (PHQ2-9): Low Risk  (11/13/2018)  Tobacco Use: Medium Risk (05/16/2023)     Readmission Risk Interventions    05/14/2023    2:46 PM  Readmission Risk Prevention Plan  Transportation Screening Complete  PCP or Specialist Appt within 5-7 Days Complete  Home Care Screening Complete  Medication Review (RN CM) Complete

## 2023-05-17 NOTE — Discharge Summary (Signed)
Physician Discharge Summary  Jessica Abbott OVF:[id number] DOB: Jul 14, 1941 DOA: 05/12/2023  PCP: Jessica Brazil, DO  Admit date: 05/12/2023 Discharge date: 05/17/2023  Time spent: 60 minutes  Recommendations for Outpatient Follow-up:  Follow-up with Dr. Lorenso Abbott, gastroenterology in 1 to 2 weeks for further evaluation and probable set up for video capsule endoscopy. Follow-up with Jessica Pigeon B, DO in 2 weeks.  On follow-up patient will need a basic metabolic profile, magnesium level, phosphorus level done to follow-up on electrolytes and renal function.  Patient will need a CBC done to follow-up on counts.   Discharge Diagnoses:  Principal Problem:   Symptomatic anemia Active Problems:   Acute GI bleeding   Acute renal failure (HCC)   PVD (peripheral vascular disease) (HCC)   Controlled diabetes mellitus type 2 with complications (HCC)   Benign essential HTN   Thrombocytosis   PAD (peripheral artery disease) (HCC)   Cutaneous lupus erythematosus   COPD (chronic obstructive pulmonary disease) (HCC)   Bacteremia due to Pseudomonas   Acute cystitis without hematuria   Discharge Condition: Stable and improved.  Diet recommendation: Heart healthy  Filed Weights   05/12/23 1747 05/16/23 0700  Weight: 79.4 kg 79.4 kg    History of present illness:  HPI per Dr. Joneen Abbott This is a 82 year old female with PMHx of IDA, HTN, T2DM, CKD stage IIIb, moderate COPD, cutaneous lupus, PAD sp R AKA, PVD, wheelchair-bound.  Today she woke feeling weak and short of breath.  She was febrile and with episodes of altered mentation.  She endorses fever but no chills or burning urination.  She denies chest pains, being lightheaded or dizzy.  Her stool has been dark-colored however patient maintained on iron.  She denies weight loss or abdominal pain.   At [facility] patients intake vitals was 155/43, 77, 24, 100.6.  WBC 17.9, Hgb 6.4,  platelets 437, MCV 72.8, COVID-negative, fecal  occult blood positive, UA consistent with acute cystitis  Blood and urine cultures x 2 collected.  Patient given 1 L LR bolus, 2 g IV Rocephin and 40 mg IV Protonix given.   Patient has a known history of iron deficiency anemia.  Her last colonoscopy was 1 to 2 years ago.  Some vessels were cauterized.  She had a capsule study done around the same time, it was unrevealing.  She [facility] with GI at [facility].  She gets an iron infusion periodically.  She had iron transfusion last Tuesday, and is scheduled for another this Tuesday.  [facility].  She denies spicy foods, NSAIDs or GERD.  Patient accepted in transfer.  Hospital Course:  #1 symptomatic anemia/iron deficiency anemia/acute GI bleed -Patient presented with symptomatic anemia with generalized weakness, shortness of breath. -Hemoglobin noted admission was 6.4. -FOBT positive. -Patient denied any overt bleeding. -Status posttransfusion 1 unit PRBCs and IV iron with hemoglobin stabilized at 8.8 by day of discharge.  -Patient underwent upper endoscopy on 05/14/2023 with gastritis noted. -Patient underwent colonoscopy, 05/16/2023 with inadequate prep noted, pandiverticulosis with no active bleeding noted. -Patient maintained on IV PPI twice daily and Carafate during the hospitalization.  -GI recommending outpatient video capsule endoscopy. -Patient improved clinically and will be discharged home on PPI daily as well as Carafate x 2 weeks. -Outpatient follow-up with gastroenterology for video capsule endoscopy and further evaluation.   2.  Acute cystitis/Pseudomonas bacteremia -Urinalysis consistent with UTI with urine cultures positive for Pseudomonas. -Blood cultures also noted to be positive for Pseudomonas. -  Patient maintained on IV Fortaz during the hospitalization and will be discharged on 7 days of ciprofloxacin to complete a 10-day course of antibiotic treatment.   -Discussed with ID.   3.  CKD  stage IIIb -Stable.   4.  Hypertension -Patient maintained on home regimen Norvasc, HCTZ, Cozaar.   5.  PAD/PVD -Patient maintained on home regimen aspirin, Lipitor.    6.  Thrombocytosis -Resolved.   7.  Hypophosphatemia -Repleted during the hospitalization. -Outpatient follow-up..    Procedures: Chest x-ray 05/12/2023 Upper endoscopy 05/14/2023 per Dr Jessica Abbott. Transfusion 1 unit PRBCs 05/13/2023 Colonoscopy 05/16/2023 per Dr. Lorenso Abbott  Consultations: Gastroenterology: Dr. Lorenso Abbott 05/13/2023   Discharge Exam: Vitals:   05/16/23 2040 05/17/23 0545  BP: (!) 172/44 (!) 153/40  Pulse: 80 79  Resp: 18 18  Temp: 98 F (36.7 C) 98 F (36.7 C)  SpO2: 96% 98%    General: NAD Cardiovascular: RRR no murmurs rubs or gallops.  No JVD.  No lower extremity edema. Respiratory: Clear to auscultation bilaterally.  No wheezes, no crackles, no rhonchi.  Fair air movement.  Speaking in full sentences.  Discharge Instructions   Discharge Instructions     Diet - low sodium heart healthy   Complete by: As directed    Increase activity slowly   Complete by: As directed       Allergies as of 05/17/2023       Reactions   Gabapentin Other (See Comments)   Per patient, 300mg  dose made her extremely weak and unable to move. Ok with lower doses.        Medication List     TAKE these medications    albuterol 108 (90 Base) MCG/ACT inhaler Commonly known as: VENTOLIN HFA Inhale 2 puffs into the lungs every 6 (six) hours as needed for wheezing or shortness of breath.   amLODipine 10 MG tablet Commonly known as: NORVASC Take 1 tablet by mouth daily.   aspirin EC 81 MG tablet Take 81 mg by mouth daily.   atorvastatin 10 MG tablet Commonly known as: LIPITOR Take 10 mg by mouth daily.   Cholecalciferol 25 MCG (1000 UT) tablet Take 1,000 Units by mouth daily.   ciprofloxacin 500 MG tablet Commonly known as: CIPRO Take 1 tablet (500 mg total) by mouth 2 (two) times daily  for 7 days.   docusate sodium 100 MG capsule Commonly known as: COLACE Take 100 mg by mouth daily as needed (constipation).   ferrous sulfate 325 (65 FE) MG tablet Take 1 tablet (325 mg total) by mouth 2 (two) times daily with a meal.   hydrochlorothiazide 12.5 MG capsule Commonly known as: MICROZIDE Take 12.5 mg by mouth daily.   lactose free nutrition Liqd Take 237 mLs by mouth daily as needed (occasionally).   losartan 100 MG tablet Commonly known as: COZAAR Take 100 mg by mouth daily.   ondansetron 4 MG disintegrating tablet Commonly known as: ZOFRAN-ODT Take 1 tablet (4 mg total) by mouth every 8 (eight) hours as needed for nausea or vomiting.   pantoprazole 40 MG tablet Commonly known as: Protonix Take 1 tablet (40 mg total) by mouth daily.   sucralfate 1 g tablet Commonly known as: CARAFATE Take 1 tablet (1 g total) by mouth every 6 (six) hours for 14 days.       Allergies  Allergen Reactions   Gabapentin Other (See Comments)    Per patient, 300mg  dose made her extremely weak and unable to move. Ok with lower doses.  Follow-up Information     [facility] Follow up.   Contact information: [address] [place] [address] [phone]         Jessica Pigeon B, DO. Schedule an appointment as soon as possible for a visit in 2 week(s).   Specialty: Internal Medicine Contact information: [address] [place] [address] [phone]         Lynann Bologna, DO. Schedule an appointment as soon as possible for a visit in 2 week(s).   Specialty: Gastroenterology Why: Follow-up in 1 to 2 weeks. Contact information: [address] [place] [address] [phone]                  The results of significant diagnostics from this hospitalization (including imaging, microbiology, ancillary and laboratory) are listed below for reference.    Significant Diagnostic Studies: DG  Chest Portable 1 View  Result Date: 05/12/2023 CLINICAL DATA:  Shortness of breath. EXAM: PORTABLE CHEST 1 VIEW COMPARISON:  May 01, 2023 FINDINGS: The heart size and mediastinal contours are within normal limits. Both lungs are clear. Multilevel degenerative changes seen throughout the thoracic spine. IMPRESSION: No active disease. Electronically Signed   By: Aram Candela M.D.   On: 05/12/2023 18:53   DG Chest 2 View  Result Date: 05/01/2023 CLINICAL DATA:  Cough, fever, weakness, nausea, and vomiting. EXAM: CHEST - 2 VIEW COMPARISON:  None Available. FINDINGS: Heart is enlarged and the mediastinal contour is within normal limits. There is atherosclerotic calcification of the aorta. Mild subsegmental atelectasis or scarring with 6 mm nodular density is noted in the mid left lung. No consolidation, effusion, or pneumothorax. Degenerative changes are present in the thoracic spine. No acute osseous abnormality IMPRESSION: 1. No acute infiltrate. 2. Mild atelectasis or scarring in the mid left lung with 6 mm nodular density. CT is recommended for further evaluation. Electronically Signed   By: Thornell Sartorius M.D.   On: 05/01/2023 20:03    Microbiology: Recent Results (from the past 240 hour(s))  SARS Coronavirus 2 by RT PCR (hospital order, performed in [facility] hospital lab) *cepheid single result test* Anterior Nasal Swab     Status: None   Collection Time: 05/12/23  5:52 PM   Specimen: Anterior Nasal Swab  Result Value Ref Range Status   SARS Coronavirus 2 by RT PCR NEGATIVE NEGATIVE Final    Comment: (NOTE) SARS-CoV-2 target nucleic acids are NOT DETECTED.  The SARS-CoV-2 RNA is generally detectable in upper and lower respiratory specimens during the acute phase of infection. The lowest concentration of SARS-CoV-2 viral copies this assay can detect is 250 copies / mL. A negative result does not preclude SARS-CoV-2 infection and should not be used as the sole basis for treatment or  other patient management decisions.  A negative result may occur with improper specimen collection / handling, submission of specimen other than nasopharyngeal swab, presence of viral mutation(s) within the areas targeted by this assay, and inadequate number of viral copies (<250 copies / mL). A negative result must be combined with clinical observations, patient history, and epidemiological information.  Fact Sheet for Patients:   RoadLapTop.co.za  Fact Sheet for Healthcare Providers: http://kim-miller.com/  This test is not yet approved or  cleared by the [place] FDA and has been authorized for detection and/or diagnosis of SARS-CoV-2 by FDA under an Emergency Use Authorization (EUA).  This EUA will remain in effect (meaning this  test can be used) for the duration of the COVID-19 declaration under Section 564(Abbott)(1) of the Act, 21 U.S.C. section 360bbb-3(Abbott)(1), unless the authorization is terminated or revoked sooner.  Performed at [facility], [address] [place], [place] [address]   Urine Culture     Status: Abnormal   Collection Time: 05/12/23  6:24 PM   Specimen: Urine, Random  Result Value Ref Range Status   Specimen Description   Final    URINE, RANDOM Performed at [facility], [address] [place], [place] [address]    Special Requests   Final    NONE Reflexed from [id number] Performed at [facility], [address] [place], [place] [address]    Culture >=100,000 COLONIES/mL PSEUDOMONAS AERUGINOSA (A)  Final   Report Status 05/15/2023 FINAL  Final   Organism ID, Bacteria PSEUDOMONAS AERUGINOSA (A)  Final      Susceptibility   Pseudomonas aeruginosa - MIC*    CEFTAZIDIME 4 SENSITIVE Sensitive     CIPROFLOXACIN <=0.25 SENSITIVE Sensitive     GENTAMICIN <=1 SENSITIVE Sensitive     IMIPENEM <=0.25 SENSITIVE Sensitive     PIP/TAZO 16 SENSITIVE Sensitive     CEFEPIME 4 SENSITIVE  Sensitive     * >=100,000 COLONIES/mL PSEUDOMONAS AERUGINOSA  Blood Culture (routine x 2)     Status: Abnormal   Collection Time: 05/12/23  6:40 PM   Specimen: BLOOD  Result Value Ref Range Status   Specimen Description   Final    BLOOD RIGHT ANTECUBITAL Performed at [facility], [address], [place], [place] [address]    Special Requests   Final    BOTTLES DRAWN AEROBIC AND ANAEROBIC Blood Culture adequate volume Performed at [facility], [address] [place], [place] [address]    Culture  Setup Time   Final    GRAM NEGATIVE RODS AEROBIC BOTTLE ONLY CRITICAL RESULT CALLED TO, READ BACK BY AND VERIFIED WITH: PHARMD D.WOFFORD [id number] @ 1914 FH Performed at [facility], [address] [place], [place] [address]    Culture PSEUDOMONAS AERUGINOSA (A)  Final   Report Status 05/15/2023 FINAL  Final   Organism ID, Bacteria PSEUDOMONAS AERUGINOSA  Final      Susceptibility   Pseudomonas aeruginosa - MIC*    CEFTAZIDIME 4 SENSITIVE Sensitive     CIPROFLOXACIN <=0.25 SENSITIVE Sensitive     GENTAMICIN <=1 SENSITIVE Sensitive     IMIPENEM <=0.25 SENSITIVE Sensitive     PIP/TAZO 16 SENSITIVE Sensitive     * PSEUDOMONAS AERUGINOSA  Blood Culture ID Panel (Reflexed)     Status: Abnormal   Collection Time: 05/12/23  6:40 PM  Result Value Ref Range Status   Enterococcus faecalis NOT DETECTED NOT DETECTED Final   Enterococcus Faecium NOT DETECTED NOT DETECTED Final   Listeria monocytogenes NOT DETECTED NOT DETECTED Final   Staphylococcus species NOT DETECTED NOT DETECTED Final   Staphylococcus aureus (BCID) NOT DETECTED NOT DETECTED Final   Staphylococcus epidermidis NOT DETECTED NOT DETECTED Final   Staphylococcus lugdunensis NOT DETECTED NOT DETECTED Final   Streptococcus species NOT DETECTED NOT DETECTED Final   Streptococcus agalactiae NOT DETECTED NOT DETECTED Final   Streptococcus pneumoniae NOT DETECTED NOT DETECTED Final   Streptococcus pyogenes NOT  DETECTED NOT DETECTED Final   A.calcoaceticus-baumannii NOT DETECTED NOT DETECTED Final   Bacteroides fragilis NOT DETECTED NOT DETECTED Final   Enterobacterales NOT DETECTED NOT DETECTED Final  Enterobacter cloacae complex NOT DETECTED NOT DETECTED Final   Escherichia coli NOT DETECTED NOT DETECTED Final   Klebsiella aerogenes NOT DETECTED NOT DETECTED Final   Klebsiella oxytoca NOT DETECTED NOT DETECTED Final   Klebsiella pneumoniae NOT DETECTED NOT DETECTED Final   Proteus species NOT DETECTED NOT DETECTED Final   Salmonella species NOT DETECTED NOT DETECTED Final   Serratia marcescens NOT DETECTED NOT DETECTED Final   Haemophilus influenzae NOT DETECTED NOT DETECTED Final   Neisseria meningitidis NOT DETECTED NOT DETECTED Final   Pseudomonas aeruginosa DETECTED (A) NOT DETECTED Final    Comment: CRITICAL RESULT CALLED TO, READ BACK BY AND VERIFIED WITH: DREW WOFFORD ON 8/26 @ 1914 BY FH    Stenotrophomonas maltophilia NOT DETECTED NOT DETECTED Final   Candida albicans NOT DETECTED NOT DETECTED Final   Candida auris NOT DETECTED NOT DETECTED Final   Candida glabrata NOT DETECTED NOT DETECTED Final   Candida krusei NOT DETECTED NOT DETECTED Final   Candida parapsilosis NOT DETECTED NOT DETECTED Final   Candida tropicalis NOT DETECTED NOT DETECTED Final   Cryptococcus neoformans/gattii NOT DETECTED NOT DETECTED Final   CTX-M ESBL NOT DETECTED NOT DETECTED Final   Carbapenem resistance IMP NOT DETECTED NOT DETECTED Final   Carbapenem resistance KPC NOT DETECTED NOT DETECTED Final   Carbapenem resistance NDM NOT DETECTED NOT DETECTED Final   Carbapenem resistance VIM NOT DETECTED NOT DETECTED Final    Comment: Performed at [facility], [address], [place], [place] [address]  Blood Culture (routine x 2)     Status: Abnormal   Collection Time: 05/12/23  6:52 PM   Specimen: BLOOD  Result Value Ref Range Status   Specimen Description   Final    BLOOD LEFT  ANTECUBITAL Performed at [facility], [address], [place], [place] [address]    Special Requests   Final    BOTTLES DRAWN AEROBIC AND ANAEROBIC Blood Culture adequate volume Performed at [facility], [address] [place], [place] [address]    Culture  Setup Time   Final    GRAM NEGATIVE RODS AEROBIC BOTTLE ONLY CRITICAL VALUE NOTED.  VALUE IS CONSISTENT WITH PREVIOUSLY REPORTED AND CALLED VALUE. CRITICAL RESULT CALLED TO, READ BACK BY AND VERIFIED WITH: PHARMD J.GIDHIA AT 2355 ON 05/14/2023 BY T.SAAD.    Culture (A)  Final    PSEUDOMONAS AERUGINOSA SUSCEPTIBILITIES PERFORMED ON PREVIOUS CULTURE WITHIN THE LAST 5 DAYS. Performed at [facility], [address] [place], [place] [address]    Report Status 05/15/2023 FINAL  Final     Labs: Basic Metabolic Panel: Recent Labs  Lab 05/12/23 1840 05/13/23 1058 05/15/23 1104 05/16/23 0535 05/17/23 0532  NA 131* 138 135 136 140  K 4.1 4.1 4.1 3.9 4.0  CL 101 104 106 107 108  CO2 19* 21* 22 22 24   GLUCOSE 135* 117* 169* 84 88  BUN 32* 29* 20 14 14   CREATININE 1.80* 1.67* 1.40* 1.22* 1.16*  CALCIUM 8.8* 9.4 8.8* 8.5* 9.1  MG  --  2.0 1.9 1.9 2.0  PHOS  --  4.8* 2.4*  --  3.3   Liver Function Tests: Recent Labs  Lab 05/12/23 1840 05/13/23 1058 05/15/23 1104 05/17/23 0532  AST 20 17  --   --   ALT 12 11  --   --   ALKPHOS 54 49  --   --   BILITOT 0.6 1.0  --   --  PROT 7.9 7.2  --   --   ALBUMIN 3.3* 3.1* 2.7* 2.8*   No results for input(s): "LIPASE", "AMYLASE" in the last 168 hours. No results for input(s): "AMMONIA" in the last 168 hours. CBC: Recent Labs  Lab 05/12/23 1840 05/13/23 1058 05/15/23 1104 05/16/23 0535 05/17/23 0532  WBC 17.9* 20.7* 9.2 7.3 7.5  NEUTROABS 15.6* 18.2* 7.0 4.9  --   HGB 6.4* 7.7* 8.4* 8.3* 8.8*  HCT 20.6* 24.4* 27.4* 27.0* 28.6*  MCV 72.8* 79.0* 79.9* 79.4* 79.7*  PLT 436* 342 347 363 355   Cardiac Enzymes: No results for input(s): "CKTOTAL",  "CKMB", "CKMBINDEX", "TROPONINI" in the last 168 hours. BNP: BNP (last 3 results) No results for input(s): "BNP" in the last 8760 hours.  ProBNP (last 3 results) No results for input(s): "PROBNP" in the last 8760 hours.  CBG: No results for input(s): "GLUCAP" in the last 168 hours.     Signed:  Ramiro Harvest MD.  [facility] 05/17/2023, 12:50 PM

## 2023-05-20 ENCOUNTER — Encounter (HOSPITAL_COMMUNITY): Payer: Self-pay | Admitting: Internal Medicine

## 2023-05-23 LAB — SURGICAL PATHOLOGY

## 2023-07-22 ENCOUNTER — Other Ambulatory Visit: Payer: Self-pay | Admitting: *Deleted

## 2023-07-22 DIAGNOSIS — I779 Disorder of arteries and arterioles, unspecified: Secondary | ICD-10-CM

## 2023-07-30 ENCOUNTER — Ambulatory Visit (HOSPITAL_COMMUNITY): Payer: Medicare Other

## 2023-07-30 ENCOUNTER — Ambulatory Visit: Payer: Medicare Other

## 2023-09-16 ENCOUNTER — Ambulatory Visit (HOSPITAL_COMMUNITY)
Admission: RE | Admit: 2023-09-16 | Discharge: 2023-09-16 | Disposition: A | Discharge status: Home / Self Care | Payer: Medicare Other | Source: Ambulatory Visit | Attending: Vascular Surgery | Admitting: Vascular Surgery

## 2023-09-16 ENCOUNTER — Ambulatory Visit: Payer: Medicare Other | Admitting: Physician Assistant

## 2023-09-16 ENCOUNTER — Ambulatory Visit (INDEPENDENT_AMBULATORY_CARE_PROVIDER_SITE_OTHER)
Admission: RE | Admit: 2023-09-16 | Discharge: 2023-09-16 | Disposition: A | Discharge status: Home / Self Care | Payer: Medicare Other | Source: Ambulatory Visit | Attending: Vascular Surgery | Admitting: Vascular Surgery

## 2023-09-16 VITALS — BP 169/61 | HR 55 | Temp 97.5°F

## 2023-09-16 DIAGNOSIS — I779 Disorder of arteries and arterioles, unspecified: Secondary | ICD-10-CM

## 2023-09-16 NOTE — Progress Notes (Unsigned)
[facility] [place] HISTORY AND PHYSICAL   History of Present Illness:  Patient is a 82 y.o. year old female who presents for evaluation of PAD.  She has a history of left femoral to dorsalis pedis bypass graft that was done in 2016 in [place].  Right AKA prothesis.     She does not ambulate as much as she used to, she gets tired easier.  She is in a WC today, but does have her prothesis on.  She denies rest pain, non healing wounds and no claudication when she does walk.  Her daughter Malachi Bonds is with her today.    She is medically managed on ASA and a daily Statin.    Past Medical History:  Diagnosis Date   Anemia    Aortic regurgitation    moderate AR by 11/25/15 echo   Arthritis    Chronic kidney disease    Diabetes mellitus without complication (HCC)    History of blood transfusion    History of bronchitis    History of pneumonia    Hypertension    Peripheral arterial disease (HCC)    Peripheral vascular disease (HCC)    Pulmonary hypertension (HCC)    11/25/15 echo: PA systolic pressure severely increaed, PA peak pressure 66 mm Hg.    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   SOB (shortness of breath)    Stress incontinence     Past Surgical History:  Procedure Laterality Date   ABDOMINAL AORTOGRAM W/LOWER EXTREMITY N/A 10/22/2016   Procedure: Abdominal Aortogram w/Lower Extremity;  Surgeon: Chuck Hint, MD;  Location: [facility] INVASIVE CV LAB;  Service: Cardiovascular;  Laterality: N/A;  lt leg   ABDOMINAL HYSTERECTOMY     AMPUTATION Right 12/23/2015   Procedure: RIGHT  ABOVE KNEE AMPUTATION;  Surgeon: Chuck Hint, MD;  Location: [facility] OR;  Service: Vascular;  Laterality: Right;   BACK SURGERY  2002   BIOPSY  05/14/2023   Procedure: BIOPSY;  Surgeon: Lynann Bologna, DO;  Location: [facility] ENDOSCOPY;  Service: Gastroenterology;;   COLONOSCOPY N/A 11/24/2015   Procedure: COLONOSCOPY;  Surgeon: Bernette Redbird, MD;  Location: [facility]  ENDOSCOPY;  Service: Endoscopy;  Laterality: N/A;   COLONOSCOPY N/A 05/16/2023   Procedure: COLONOSCOPY;  Surgeon: Lynann Bologna, DO;  Location: [facility] ENDOSCOPY;  Service: Gastroenterology;  Laterality: N/A;   ESOPHAGOGASTRODUODENOSCOPY N/A 05/14/2023   Procedure: ESOPHAGOGASTRODUODENOSCOPY (EGD);  Surgeon: Lynann Bologna, DO;  Location: [facility] ENDOSCOPY;  Service: Gastroenterology;  Laterality: N/A;   ESOPHAGOGASTRODUODENOSCOPY (EGD) WITH PROPOFOL N/A 11/23/2015   Procedure: ESOPHAGOGASTRODUODENOSCOPY (EGD) WITH PROPOFOL;  Surgeon: Bernette Redbird, MD;  Location: [facility] ENDOSCOPY;  Service: Endoscopy;  Laterality: N/A;   femoral artery arteriogram  04/20/2015   GIVENS CAPSULE STUDY N/A 11/26/2015   Procedure: GIVENS CAPSULE STUDY;  Surgeon: Bernette Redbird, MD;  Location: [facility] ENDOSCOPY;  Service: Endoscopy;  Laterality: N/A;    ROS:   General:  No weight loss, Fever, chills  HEENT: No recent headaches, no nasal bleeding, no visual changes, no sore throat  Neurologic: No dizziness, blackouts, seizures. No recent symptoms of stroke or mini- stroke. No recent episodes of slurred speech, or temporary blindness.  Cardiac: No recent episodes of chest pain/pressure, no shortness of breath at rest.  No shortness of breath with exertion.  Denies history of atrial fibrillation or irregular heartbeat  Vascular: No history of rest pain in feet.  No history of claudication.  No history of non-healing ulcer, No history of DVT  Pulmonary: No home oxygen, no productive cough, no hemoptysis,  No asthma or wheezing  Musculoskeletal:  [ ]  Arthritis, [ ]  Low back pain,  [ ]  Joint pain  Hematologic:No history of hypercoagulable state.  No history of easy bleeding.  No history of anemia  Gastrointestinal: No hematochezia or melena,  No gastroesophageal reflux, no trouble swallowing  Urinary: [ ]  chronic Kidney disease, [ ]  on HD - [ ]  MWF or [ ]  TTHS, [ ]  Burning with urination, [ ]  Frequent urination, [ ]   Difficulty urinating;   Skin: No rashes  Psychological: No history of anxiety,  No history of depression  Social History Social History   Tobacco Use   Smoking status: Former    Current packs/day: 0.00    Types: Cigarettes    Quit date: 10/27/2013    Years since quitting: 9.8   Smokeless tobacco: Never  Vaping Use   Vaping status: Never Used  Substance Use Topics   Alcohol use: No    Alcohol/week: 0.0 standard drinks of alcohol   Drug use: No    Family History Family History  Problem Relation Age of Onset   Heart disease Father     Allergies  Allergies  Allergen Reactions   Gabapentin Other (See Comments)    Per patient, 300mg  dose made her extremely weak and unable to move. Ok with lower doses.     Current Outpatient Medications  Medication Sig Dispense Refill   albuterol (PROVENTIL HFA;VENTOLIN HFA) 108 (90 Base) MCG/ACT inhaler Inhale 2 puffs into the lungs every 6 (six) hours as needed for wheezing or shortness of breath.     amLODipine (NORVASC) 10 MG tablet Take 1 tablet by mouth daily.     aspirin EC 81 MG tablet Take 81 mg by mouth daily.      atorvastatin (LIPITOR) 10 MG tablet Take 10 mg by mouth daily.     Cholecalciferol 1000 units tablet Take 1,000 Units by mouth daily.     docusate sodium (COLACE) 100 MG capsule Take 100 mg by mouth daily as needed (constipation).     ferrous sulfate 325 (65 FE) MG tablet Take 1 tablet (325 mg total) by mouth 2 (two) times daily with a meal. 60 tablet 1   hydrochlorothiazide (MICROZIDE) 12.5 MG capsule Take 12.5 mg by mouth daily.     lactose free nutrition (BOOST) LIQD Take 237 mLs by mouth daily as needed (occasionally).      losartan (COZAAR) 100 MG tablet Take 100 mg by mouth daily.     ondansetron (ZOFRAN-ODT) 4 MG disintegrating tablet Take 1 tablet (4 mg total) by mouth every 8 (eight) hours as needed for nausea or vomiting. 20 tablet 0   pantoprazole (PROTONIX) 40 MG tablet Take 1 tablet (40 mg total) by mouth  daily. 30 tablet 1   sucralfate (CARAFATE) 1 g tablet Take 1 tablet (1 g total) by mouth every 6 (six) hours for 14 days. 56 tablet 0   No current facility-administered medications for this visit.    Physical Examination  There were no vitals filed for this visit.  There is no height or weight on file to calculate BMI.  General:  Alert and oriented, no acute distress HEENT: Normal Neck: No bruit or JVD Pulmonary: Clear to auscultation bilaterally Cardiac: Regular Rate and Rhythm without murmur Abdomen: Soft, non-tender, non-distended, no mass, no scars Skin: No rash Extremity Pulses:  2+ radial, brachial, femoral, dorsalis pedis, posterior tibial pulses bilaterally Musculoskeletal: No deformity or  edema  Neurologic: Upper and lower extremity motor 5/5 and symmetric  DATA:   ABI Findings:  +---------+------------------+-----+----------+--------+  Right   Rt Pressure (mmHg)IndexWaveform  Comment   +---------+------------------+-----+----------+--------+  Brachial 170                                        +---------+------------------+-----+----------+--------+  PTA                            absent              +---------+------------------+-----+----------+--------+  DP                             monophasicBrisk     +---------+------------------+-----+----------+--------+  Great Toe                       Abnormal            +---------+------------------+-----+----------+--------+   +---------+------------------+-----+--------+-------+  Left    Lt Pressure (mmHg)IndexWaveformComment  +---------+------------------+-----+--------+-------+  Brachial 174                                     +---------+------------------+-----+--------+-------+  PTA     0                 0.00                  +---------+------------------+-----+--------+-------+  DP      109               0.63                   +---------+------------------+-----+--------+-------+  Great Toe71                0.41                  +---------+------------------+-----+--------+-------+   +-------+-----------+-----------+------------+------------+  ABI/TBIToday's ABIToday's TBIPrevious ABIPrevious TBI  +-------+-----------+-----------+------------+------------+  Right AKA        AKA        AKA         AKA           +-------+-----------+-----------+------------+------------+  Left  0.63       0.41       0.66        0.50          +-------+-----------+-----------+------------+------------+    +----------+--------+-----+---------------+----------+--------+  LEFT     PSV cm/sRatioStenosis       Waveform  Comments  +----------+--------+-----+---------------+----------+--------+  CFA Distal262          50-74% stenosismonophasic          +----------+--------+-----+---------------+----------+--------+  DFA      128                                             +----------+--------+-----+---------------+----------+--------+     Left Graft #1: Left femoral-dorsalis pedis artery bypass graft  +--------------------+--------+---------------+----------+---------+                     PSV cm/sStenosis       Waveform  Comments   +--------------------+--------+---------------+----------+---------+  Inflow             262     50-74% stenosismonophasic           +--------------------+--------+---------------+----------+---------+  Proximal Anastomosis402     >70% stenosis  monophasicturbulent  +--------------------+--------+---------------+----------+---------+  Proximal Graft      212                    monophasicturbulent  +--------------------+--------+---------------+----------+---------+  Mid Graft           227                    monophasicbranches   +--------------------+--------+---------------+----------+---------+  Distal Graft        88                      monophasic           +--------------------+--------+---------------+----------+---------+  Distal Anastomosis  118                    monophasic           +--------------------+--------+---------------+----------+---------+  Outflow            73                     monophasic           +--------------------+--------+---------------+----------+---------+          Summary:  Left: 50-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 PSV in the  proximal anastomosis suggests >70% stenosis. There are elevated velocities  in the proximal-mid graft suggestive of 50-70% stenosis without  significant plaque morphology.  There is branching collateral vessels in the graft at the knee level.         ASSESSMENT/PLAN:  PAD with history of right AKA and left fem-dorsalis pedis bypass graft that was done in 2016 in [place]. 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  She has had elevated velocities at the bypass proximal anastomosis in the past, as well as today.  The PSV 402 today.  However the graft has a good pulse and she is asymptomatic. Dr. Edilia Bo stated she had elevated velocities in 2022 and decided it would be reasonable to follow this for now and only consider arteriography and possible intervention if the flow in the graft dropped significantly.   If she develops rest pain or non healing wounds she will call us.       Mosetta Pigeon PA-C [facility] [place] Office: [phone]  MD on call Hetty Blend

## 2023-09-17 LAB — VAS US ABI WITH/WO TBI: Left ABI: 0.63

## 2023-09-19 ENCOUNTER — Encounter: Payer: Self-pay | Admitting: Physician Assistant

## 2023-09-26 ENCOUNTER — Other Ambulatory Visit: Payer: Self-pay

## 2023-09-26 DIAGNOSIS — I779 Disorder of arteries and arterioles, unspecified: Secondary | ICD-10-CM

## 2024-09-15 ENCOUNTER — Other Ambulatory Visit: Payer: Self-pay

## 2024-09-15 DIAGNOSIS — I779 Disorder of arteries and arterioles, unspecified: Secondary | ICD-10-CM

## 2024-10-15 ENCOUNTER — Ambulatory Visit (HOSPITAL_COMMUNITY)
Admission: RE | Admit: 2024-10-15 | Discharge: 2024-10-15 | Disposition: A | Discharge status: Home / Self Care | Source: Ambulatory Visit | Attending: Vascular Surgery | Admitting: Vascular Surgery

## 2024-10-15 ENCOUNTER — Ambulatory Visit: Admitting: Physician Assistant

## 2024-10-15 ENCOUNTER — Ambulatory Visit (HOSPITAL_BASED_OUTPATIENT_CLINIC_OR_DEPARTMENT_OTHER)
Admission: RE | Admit: 2024-10-15 | Discharge: 2024-10-15 | Disposition: A | Discharge status: Home / Self Care | Source: Ambulatory Visit | Attending: Vascular Surgery | Admitting: Vascular Surgery

## 2024-10-15 VITALS — BP 152/58 | HR 88 | Temp 97.7°F

## 2024-10-15 DIAGNOSIS — I779 Disorder of arteries and arterioles, unspecified: Secondary | ICD-10-CM | POA: Diagnosis present

## 2024-10-15 LAB — VAS US ABI WITH/WO TBI: Left ABI: 0.53
